# Patient Record
Sex: Female | Born: 1935
Health system: Southern US, Community
[De-identification: ages and names within clinical notes are randomized; demographics above are authoritative.]

## PROBLEM LIST (undated history)

## (undated) DIAGNOSIS — R519 Headache, unspecified: Secondary | ICD-10-CM

## (undated) DIAGNOSIS — I10 Essential (primary) hypertension: Secondary | ICD-10-CM

## (undated) DIAGNOSIS — R7303 Prediabetes: Secondary | ICD-10-CM

## (undated) DIAGNOSIS — R51 Headache: Secondary | ICD-10-CM

## (undated) DIAGNOSIS — Z8719 Personal history of other diseases of the digestive system: Secondary | ICD-10-CM

## (undated) DIAGNOSIS — M1990 Unspecified osteoarthritis, unspecified site: Secondary | ICD-10-CM

## (undated) DIAGNOSIS — E559 Vitamin D deficiency, unspecified: Secondary | ICD-10-CM

## (undated) DIAGNOSIS — R7309 Other abnormal glucose: Secondary | ICD-10-CM

## (undated) DIAGNOSIS — F329 Major depressive disorder, single episode, unspecified: Secondary | ICD-10-CM

## (undated) DIAGNOSIS — Z87440 Personal history of urinary (tract) infections: Secondary | ICD-10-CM

## (undated) DIAGNOSIS — Z85820 Personal history of malignant melanoma of skin: Secondary | ICD-10-CM

## (undated) DIAGNOSIS — C801 Malignant (primary) neoplasm, unspecified: Secondary | ICD-10-CM

## (undated) DIAGNOSIS — F32A Depression, unspecified: Secondary | ICD-10-CM

## (undated) DIAGNOSIS — C439 Malignant melanoma of skin, unspecified: Secondary | ICD-10-CM

## (undated) DIAGNOSIS — F419 Anxiety disorder, unspecified: Secondary | ICD-10-CM

## (undated) DIAGNOSIS — K219 Gastro-esophageal reflux disease without esophagitis: Secondary | ICD-10-CM

## (undated) DIAGNOSIS — Z9889 Other specified postprocedural states: Secondary | ICD-10-CM

## (undated) DIAGNOSIS — C50919 Malignant neoplasm of unspecified site of unspecified female breast: Secondary | ICD-10-CM

## (undated) DIAGNOSIS — G8929 Other chronic pain: Secondary | ICD-10-CM

## (undated) HISTORY — DX: Headache, unspecified: R51.9

## (undated) HISTORY — DX: Headache: R51

## (undated) HISTORY — DX: Major depressive disorder, single episode, unspecified: F32.9

## (undated) HISTORY — DX: Vitamin D deficiency, unspecified: E55.9

## (undated) HISTORY — DX: Malignant neoplasm of unspecified site of unspecified female breast: C50.919

## (undated) HISTORY — DX: Other abnormal glucose: R73.09

## (undated) HISTORY — DX: Depression, unspecified: F32.A

## (undated) HISTORY — DX: Other specified postprocedural states: Z98.890

## (undated) HISTORY — DX: Malignant melanoma of skin, unspecified: C43.9

## (undated) HISTORY — DX: Anxiety disorder, unspecified: F41.9

## (undated) HISTORY — DX: Unspecified osteoarthritis, unspecified site: M19.90

## (undated) HISTORY — DX: Personal history of other diseases of the digestive system: Z87.19

## (undated) HISTORY — DX: Other chronic pain: G89.29

## (undated) HISTORY — DX: Personal history of malignant melanoma of skin: Z85.820

## (undated) HISTORY — DX: Malignant (primary) neoplasm, unspecified: C80.1

## (undated) HISTORY — DX: Gastro-esophageal reflux disease without esophagitis: K21.9

## (undated) HISTORY — DX: Personal history of urinary (tract) infections: Z87.440

---

## 1993-12-28 HISTORY — PX: MELANOMA EXCISION: SHX5266

## 1994-12-28 DIAGNOSIS — C50919 Malignant neoplasm of unspecified site of unspecified female breast: Secondary | ICD-10-CM

## 1994-12-28 HISTORY — PX: MASTECTOMY: SHX3

## 1994-12-28 HISTORY — DX: Malignant neoplasm of unspecified site of unspecified female breast: C50.919

## 1995-12-29 HISTORY — PX: BREAST EXCISIONAL BIOPSY: SUR124

## 1999-12-29 HISTORY — PX: ABDOMINAL HYSTERECTOMY: SHX81

## 2000-08-04 ENCOUNTER — Encounter: Admission: RE | Admit: 2000-08-04 | Discharge: 2000-08-04 | Payer: Self-pay | Admitting: Hematology and Oncology

## 2000-08-04 ENCOUNTER — Encounter: Payer: Self-pay | Admitting: Hematology and Oncology

## 2000-08-24 ENCOUNTER — Encounter: Payer: Self-pay | Admitting: Hematology and Oncology

## 2000-08-24 ENCOUNTER — Encounter: Admission: RE | Admit: 2000-08-24 | Discharge: 2000-08-24 | Payer: Self-pay | Admitting: Hematology and Oncology

## 2001-08-25 ENCOUNTER — Encounter: Admission: RE | Admit: 2001-08-25 | Discharge: 2001-08-25 | Payer: Self-pay | Admitting: *Deleted

## 2001-08-25 ENCOUNTER — Encounter: Payer: Self-pay | Admitting: *Deleted

## 2002-08-25 ENCOUNTER — Encounter: Admission: RE | Admit: 2002-08-25 | Discharge: 2002-08-25 | Payer: Self-pay | Admitting: *Deleted

## 2002-08-25 ENCOUNTER — Encounter: Payer: Self-pay | Admitting: *Deleted

## 2003-08-27 ENCOUNTER — Encounter: Admission: RE | Admit: 2003-08-27 | Discharge: 2003-08-27 | Payer: Self-pay | Admitting: Surgery

## 2003-08-27 ENCOUNTER — Encounter: Payer: Self-pay | Admitting: Surgery

## 2004-08-27 ENCOUNTER — Encounter: Admission: RE | Admit: 2004-08-27 | Discharge: 2004-08-27 | Payer: Self-pay | Admitting: Surgery

## 2004-10-13 ENCOUNTER — Ambulatory Visit: Payer: Self-pay | Admitting: Urology

## 2005-08-28 ENCOUNTER — Encounter: Admission: RE | Admit: 2005-08-28 | Discharge: 2005-08-28 | Payer: Self-pay | Admitting: Surgery

## 2005-11-17 ENCOUNTER — Ambulatory Visit: Payer: Self-pay | Admitting: Dermatology

## 2006-09-01 ENCOUNTER — Encounter: Admission: RE | Admit: 2006-09-01 | Discharge: 2006-09-01 | Payer: Self-pay | Admitting: Surgery

## 2007-05-11 DIAGNOSIS — D239 Other benign neoplasm of skin, unspecified: Secondary | ICD-10-CM

## 2007-05-11 HISTORY — DX: Other benign neoplasm of skin, unspecified: D23.9

## 2007-05-24 DIAGNOSIS — G44209 Tension-type headache, unspecified, not intractable: Secondary | ICD-10-CM | POA: Insufficient documentation

## 2007-09-05 ENCOUNTER — Encounter: Admission: RE | Admit: 2007-09-05 | Discharge: 2007-09-05 | Payer: Self-pay | Admitting: Family Medicine

## 2008-05-16 ENCOUNTER — Ambulatory Visit: Payer: Self-pay | Admitting: Dermatology

## 2008-05-28 ENCOUNTER — Ambulatory Visit: Payer: Self-pay | Admitting: Family Medicine

## 2008-06-01 ENCOUNTER — Ambulatory Visit: Payer: Self-pay | Admitting: Internal Medicine

## 2008-06-20 ENCOUNTER — Ambulatory Visit: Payer: Self-pay | Admitting: Internal Medicine

## 2008-06-22 ENCOUNTER — Ambulatory Visit: Payer: Self-pay | Admitting: Family Medicine

## 2008-09-05 ENCOUNTER — Encounter: Admission: RE | Admit: 2008-09-05 | Discharge: 2008-09-05 | Payer: Self-pay | Admitting: Surgery

## 2008-09-11 ENCOUNTER — Encounter: Admission: RE | Admit: 2008-09-11 | Discharge: 2008-09-11 | Payer: Self-pay | Admitting: Surgery

## 2008-11-13 ENCOUNTER — Ambulatory Visit: Payer: Self-pay | Admitting: Family Medicine

## 2008-12-28 HISTORY — PX: BREAST BIOPSY: SHX20

## 2009-01-10 ENCOUNTER — Ambulatory Visit: Payer: Self-pay | Admitting: Gastroenterology

## 2009-03-12 ENCOUNTER — Encounter (INDEPENDENT_AMBULATORY_CARE_PROVIDER_SITE_OTHER): Payer: Self-pay | Admitting: Surgery

## 2009-03-12 ENCOUNTER — Encounter: Admission: RE | Admit: 2009-03-12 | Discharge: 2009-03-12 | Payer: Self-pay | Admitting: Surgery

## 2009-09-09 ENCOUNTER — Encounter: Admission: RE | Admit: 2009-09-09 | Discharge: 2009-09-09 | Payer: Self-pay | Admitting: Surgery

## 2010-03-13 ENCOUNTER — Encounter: Admission: RE | Admit: 2010-03-13 | Discharge: 2010-03-13 | Payer: Self-pay | Admitting: Family Medicine

## 2010-09-17 ENCOUNTER — Ambulatory Visit: Payer: Self-pay | Admitting: Dermatology

## 2011-02-05 ENCOUNTER — Other Ambulatory Visit: Payer: Self-pay | Admitting: Surgery

## 2011-02-05 DIAGNOSIS — Z9012 Acquired absence of left breast and nipple: Secondary | ICD-10-CM

## 2011-03-16 ENCOUNTER — Ambulatory Visit
Admission: RE | Admit: 2011-03-16 | Discharge: 2011-03-16 | Disposition: A | Discharge status: Home / Self Care | Payer: Medicare Other | Source: Ambulatory Visit | Attending: Surgery | Admitting: Surgery

## 2011-03-16 DIAGNOSIS — Z9012 Acquired absence of left breast and nipple: Secondary | ICD-10-CM

## 2011-05-14 ENCOUNTER — Encounter (INDEPENDENT_AMBULATORY_CARE_PROVIDER_SITE_OTHER): Payer: Self-pay | Admitting: Surgery

## 2012-01-13 ENCOUNTER — Other Ambulatory Visit (INDEPENDENT_AMBULATORY_CARE_PROVIDER_SITE_OTHER): Payer: Self-pay | Admitting: Surgery

## 2012-01-13 DIAGNOSIS — Z1231 Encounter for screening mammogram for malignant neoplasm of breast: Secondary | ICD-10-CM

## 2012-01-22 ENCOUNTER — Ambulatory Visit (INDEPENDENT_AMBULATORY_CARE_PROVIDER_SITE_OTHER): Payer: Self-pay | Admitting: Surgery

## 2012-03-16 ENCOUNTER — Ambulatory Visit
Admission: RE | Admit: 2012-03-16 | Discharge: 2012-03-16 | Disposition: A | Discharge status: Home / Self Care | Payer: Medicare Other | Source: Ambulatory Visit | Attending: Surgery | Admitting: Surgery

## 2012-03-16 DIAGNOSIS — Z1231 Encounter for screening mammogram for malignant neoplasm of breast: Secondary | ICD-10-CM

## 2012-03-18 ENCOUNTER — Encounter (INDEPENDENT_AMBULATORY_CARE_PROVIDER_SITE_OTHER): Payer: Self-pay | Admitting: Surgery

## 2012-03-18 ENCOUNTER — Ambulatory Visit (INDEPENDENT_AMBULATORY_CARE_PROVIDER_SITE_OTHER): Payer: Medicare Other | Admitting: Surgery

## 2012-03-18 VITALS — BP 140/80 | HR 72 | Temp 97.9°F | Resp 18 | Ht 64.0 in | Wt 171.6 lb

## 2012-03-18 DIAGNOSIS — Z853 Personal history of malignant neoplasm of breast: Secondary | ICD-10-CM

## 2012-03-18 NOTE — Progress Notes (Signed)
[facility]  Ovidio Kin, MD,  FACS [address] [place]  [address] [place], [place]    [address] Phone:  [phone] FAX:  [phone]   Re:   Christina Salas DOB:   [birth year] MRN:   [id number]  ASSESSMENT AND PLAN: 1.  Left breast cancer - 6 cm area of DCIS with a focus of invasive carcinoma.  T1, N0  (0/13 nodes)  Left MRM - Dec 1996 - Dr. Elvina Mattes.  Has been disease free.  We talked about her PCP following her, but he has been out of the office for several months.  So she will continue to see me for now.  I see her back in one year.  2.  HIstory of melanoma removed from head - 1995.  HISTORY OF PRESENT ILLNESS: Chief Complaint  Patient presents with  . Breast Cancer Long Term Follow Up    Christina Salas is a 78 y.o. (DOB: [birth year])  white female who is a patient of MORRISEY,LEMONT, MD, MD (Dr. Thana Ates has been out of office for about 5 months, this prompted her return to me) and comes to me today for follow up left breast cancer.  She is doing well. No new complaint, mass, or area of concern.  She is doing well.  PHYSICAL EXAM: BP 140/80  Pulse 72  Temp(Src) 97.9 F (36.6 C) (Temporal)  Resp 18  Ht 5\' 4"  (1.626 m)  Wt 171 lb 9.6 oz (77.837 kg)  BMI 29.45 kg/m2  HEENT:  Pupils equal.  Dentition good.  No injury. NECK:  Supple.  No thyroid mass. LYMPH NODES:  No cervical, supraclavicular, or axillary adenopathy. BREASTS -  RIGHT:  No palpable mass or nodule.  No nipple discharge.   LEFT:  No palpable mass or nodule.  Left breast absent. UPPER EXTREMITIES:  No evidence of lymphedema.  DATA REVIEWED: Mammogram - 03/16/2012 - negative.  Ovidio Kin, MD, FACS Office:  [phone]

## 2013-02-08 DIAGNOSIS — N390 Urinary tract infection, site not specified: Secondary | ICD-10-CM | POA: Insufficient documentation

## 2013-02-08 DIAGNOSIS — N3946 Mixed incontinence: Secondary | ICD-10-CM | POA: Insufficient documentation

## 2013-02-23 ENCOUNTER — Observation Stay: Payer: Self-pay | Admitting: Internal Medicine

## 2013-02-23 LAB — COMPREHENSIVE METABOLIC PANEL
Alkaline Phosphatase: 108 U/L (ref 50–136)
BUN: 21 mg/dL — ABNORMAL HIGH (ref 7–18)
Bilirubin,Total: 0.4 mg/dL (ref 0.2–1.0)
Calcium, Total: 9.1 mg/dL (ref 8.5–10.1)
Chloride: 106 mmol/L (ref 98–107)
Co2: 25 mmol/L (ref 21–32)
Potassium: 3.9 mmol/L (ref 3.5–5.1)
SGOT(AST): 34 U/L (ref 15–37)
SGPT (ALT): 26 U/L (ref 12–78)

## 2013-02-23 LAB — CBC WITH DIFFERENTIAL/PLATELET
Basophil #: 0.1 10*3/uL (ref 0.0–0.1)
HGB: 13.8 g/dL (ref 12.0–16.0)
Lymphocyte #: 2 10*3/uL (ref 1.0–3.6)
MCV: 87 fL (ref 80–100)
Neutrophil #: 8 10*3/uL — ABNORMAL HIGH (ref 1.4–6.5)
Platelet: 211 10*3/uL (ref 150–440)
RDW: 14.3 % (ref 11.5–14.5)

## 2013-02-23 LAB — URINALYSIS, COMPLETE
Bilirubin,UR: NEGATIVE
Glucose,UR: NEGATIVE mg/dL (ref 0–75)
Ketone: NEGATIVE
Leukocyte Esterase: NEGATIVE
Specific Gravity: 1.012 (ref 1.003–1.030)

## 2013-02-23 LAB — CK TOTAL AND CKMB (NOT AT ARMC): CK, Total: 225 U/L — ABNORMAL HIGH (ref 21–215)

## 2013-02-23 LAB — PROTIME-INR: Prothrombin Time: 14 secs (ref 11.5–14.7)

## 2013-02-24 LAB — CBC WITH DIFFERENTIAL/PLATELET
Basophil #: 0 10*3/uL (ref 0.0–0.1)
Basophil %: 0.1 %
Eosinophil #: 0.2 10*3/uL (ref 0.0–0.7)
HCT: 34.8 % — ABNORMAL LOW (ref 35.0–47.0)
HGB: 11.6 g/dL — ABNORMAL LOW (ref 12.0–16.0)
Lymphocyte %: 27.7 %
MCH: 28.8 pg (ref 26.0–34.0)
MCV: 87 fL (ref 80–100)
Monocyte %: 7.1 %
Neutrophil %: 62.9 %
Platelet: 155 10*3/uL (ref 150–440)
RDW: 14.3 % (ref 11.5–14.5)
WBC: 8.2 10*3/uL (ref 3.6–11.0)

## 2013-02-24 LAB — BASIC METABOLIC PANEL
Anion Gap: 6 — ABNORMAL LOW (ref 7–16)
BUN: 11 mg/dL (ref 7–18)
Calcium, Total: 8.3 mg/dL — ABNORMAL LOW (ref 8.5–10.1)
Chloride: 110 mmol/L — ABNORMAL HIGH (ref 98–107)
Creatinine: 0.73 mg/dL (ref 0.60–1.30)
EGFR (Non-African Amer.): >60
Glucose: 90 mg/dL (ref 65–99)
Osmolality: 284 (ref 275–301)
Potassium: 3.8 mmol/L (ref 3.5–5.1)
Sodium: 143 mmol/L (ref 136–145)

## 2013-02-28 ENCOUNTER — Other Ambulatory Visit: Payer: Self-pay

## 2013-02-28 DIAGNOSIS — Z9012 Acquired absence of left breast and nipple: Secondary | ICD-10-CM

## 2013-03-01 ENCOUNTER — Telehealth (INDEPENDENT_AMBULATORY_CARE_PROVIDER_SITE_OTHER): Payer: Self-pay

## 2013-03-01 NOTE — Telephone Encounter (Signed)
Informed family member of appt 04/26/13 @ 10 am with Dr. Ezzard Standing. Advised her to have patient to call to confirm appt date and time

## 2013-03-20 ENCOUNTER — Ambulatory Visit: Payer: Self-pay | Admitting: Gastroenterology

## 2013-03-27 ENCOUNTER — Ambulatory Visit
Admission: RE | Admit: 2013-03-27 | Discharge: 2013-03-27 | Disposition: A | Discharge status: Home / Self Care | Payer: Self-pay | Source: Ambulatory Visit

## 2013-03-27 DIAGNOSIS — Z9012 Acquired absence of left breast and nipple: Secondary | ICD-10-CM

## 2013-03-27 DIAGNOSIS — Z1231 Encounter for screening mammogram for malignant neoplasm of breast: Secondary | ICD-10-CM

## 2013-04-16 LAB — HM COLONOSCOPY

## 2013-04-26 ENCOUNTER — Ambulatory Visit (INDEPENDENT_AMBULATORY_CARE_PROVIDER_SITE_OTHER): Payer: Medicare Other | Admitting: Surgery

## 2013-06-21 ENCOUNTER — Ambulatory Visit (INDEPENDENT_AMBULATORY_CARE_PROVIDER_SITE_OTHER): Payer: Medicare Other | Admitting: Surgery

## 2013-06-21 ENCOUNTER — Encounter (INDEPENDENT_AMBULATORY_CARE_PROVIDER_SITE_OTHER): Payer: Self-pay | Admitting: Surgery

## 2013-06-21 VITALS — BP 114/62 | HR 68 | Temp 99.2°F | Resp 15 | Ht 64.0 in | Wt 174.2 lb

## 2013-06-21 DIAGNOSIS — Z853 Personal history of malignant neoplasm of breast: Secondary | ICD-10-CM

## 2013-06-21 NOTE — Progress Notes (Signed)
[facility]  Ovidio Kin, MD,  FACS [address] [place]  [address] [place], [place]    [address] Phone:  [phone] FAX:  [phone]   Re:   Christina Salas DOB:   11/02/1933 MRN:   [id number]  ASSESSMENT AND PLAN: 1.  Left breast cancer - 6 cm area of DCIS with a focus of invasive carcinoma.  T1, N0  (0/13 nodes)  Left MRM - Dec 1996 - Dr. Elvina Mattes.  Has been disease free.   I saw her back this year because her PCP had been out of the office.  We again talked about her seeing me.  If she is doing well and Dr. Thana Ates follows her, then her follow up here can be PRN.  If she wants me to see her, I am more than happy to see her.  I see her back in one year.  2.  HIstory of melanoma removed from head - 1995.  HISTORY OF PRESENT ILLNESS: Chief Complaint  Patient presents with  . Breast Cancer Long Term Follow Up    yearly br ca checkup   Christina Salas is a 79 y.o. (DOB: 1933-09-02)  white female who is a patient of MORRISEY,LEMONT, MD ([place]) and comes to me today for follow up left breast cancer.  She is doing well. No new complaint, mass, or area of concern.  She reminded me that I took care of her mother the day she died at age 56.  We talked about Dr. Samuella Cota a little.  Social History: Unmarried.  Lives by self. Niece had lived with her until recently. I took care of her mother, age 50, the day she died.  PHYSICAL EXAM: BP 114/62  Pulse 68  Temp(Src) 99.2 F (37.3 C) (Temporal)  Resp 15  Ht 5\' 4"  (1.626 m)  Wt 174 lb 3.2 oz (79.017 kg)  BMI 29.89 kg/m2  HEENT:  Pupils equal.  Dentition good.  No injury. NECK:  Supple.  No thyroid mass. LYMPH NODES:  No cervical, supraclavicular, or axillary adenopathy. BREASTS -  RIGHT:  No palpable mass or nodule.  No nipple discharge.   LEFT:  No palpable mass or nodule.  Left breast absent. UPPER EXTREMITIES:  No evidence of lymphedema.  She said that she does have some mild swelling, but it  is not significant.  DATA REVIEWED: Mammogram ([facility])- 03/27/2013 - negative.  Ovidio Kin, MD, FACS Office:  [phone]

## 2013-10-17 ENCOUNTER — Ambulatory Visit: Payer: Self-pay | Admitting: Family Medicine

## 2014-02-25 LAB — HM MAMMOGRAPHY: HM Mammogram: NORMAL

## 2014-03-13 ENCOUNTER — Other Ambulatory Visit: Payer: Self-pay

## 2014-03-13 DIAGNOSIS — Z1231 Encounter for screening mammogram for malignant neoplasm of breast: Secondary | ICD-10-CM

## 2014-03-29 ENCOUNTER — Ambulatory Visit
Admission: RE | Admit: 2014-03-29 | Discharge: 2014-03-29 | Disposition: A | Discharge status: Home / Self Care | Payer: Medicare Other | Source: Ambulatory Visit

## 2014-03-29 DIAGNOSIS — Z1231 Encounter for screening mammogram for malignant neoplasm of breast: Secondary | ICD-10-CM

## 2014-04-02 HISTORY — PX: COLONOSCOPY: SHX174

## 2014-08-23 ENCOUNTER — Encounter (INDEPENDENT_AMBULATORY_CARE_PROVIDER_SITE_OTHER): Payer: Self-pay | Admitting: Surgery

## 2014-08-23 ENCOUNTER — Ambulatory Visit (INDEPENDENT_AMBULATORY_CARE_PROVIDER_SITE_OTHER): Payer: Medicare Other | Admitting: Surgery

## 2014-08-23 VITALS — BP 142/84 | HR 76 | Temp 98.0°F | Resp 18 | Ht 66.0 in | Wt 176.0 lb

## 2014-08-23 DIAGNOSIS — Z853 Personal history of malignant neoplasm of breast: Secondary | ICD-10-CM

## 2014-08-23 NOTE — Progress Notes (Signed)
[place], MD,  [address]  [place], [place]    Christina Salas Phone:  [phone] FAX:  [phone]   Re:   Christina Salas DOB:   28-Mar-1933 MRN:   [id number]  ASSESSMENT AND PLAN: 1.  Left breast cancer - 6 cm area of DCIS with a focus of invasive carcinoma.  T1, N0  (0/13 nodes)  Left MRM - Dec 1996 - Dr. Middendorf Kehr.  Has been disease free.   We have talked about her not seeing me, but she still wants to see me in one year.  I see her back in one year.  2.  HIstory of melanoma removed from head - 1995.  HISTORY OF PRESENT ILLNESS: Chief Complaint  Patient presents with  . Breast Cancer Long Term Follow Up   Christina Salas is a 80 y.o. (DOB: 06-18-1933)  white female who is a patient of MORRISEY,LEMONT, MD ([place]) and comes to me today for follow up left breast cancer. She comes by herself.  She is doing well. No new complaint, mass, or area of concern.  She reminded me that I took care of her mother the day she died at age 27.  We talked about Dr. March Rummage a little.  Social History: Unmarried.  Lives by self. I took care of her mother, age 57, the day she died.  PHYSICAL EXAM: BP 142/84  Pulse 76  Temp(Src) 98 F (36.7 C)  Resp 18  Ht 5\' 6"  (1.676 m)  Wt 176 lb (79.833 kg)  BMI 28.42 kg/m2  HEENT:  Pupils equal.  Dentition good.  No injury. NECK:  Supple.  No thyroid mass. LYMPH NODES:  No cervical, supraclavicular, or axillary adenopathy. BREASTS -  RIGHT:  No palpable mass or nodule.  No nipple discharge.   LEFT:  No palpable mass or nodule.  Left breast absent. UPPER EXTREMITIES:  No evidence of lymphedema.  She said that she does have some mild swelling of the left arm, but it is not significant.  DATA REVIEWED: Mammogram ([facility])- 03/29/2014 - negative.  Alphonsa Overall, MD, [facility] Office:  [phone]

## 2014-11-27 HISTORY — PX: CYSTOSCOPY: SUR368

## 2014-12-11 LAB — HEMOGLOBIN A1C: Hgb A1c MFr Bld: 6.2 % — AB (ref 4.0–6.0)

## 2015-01-01 LAB — BASIC METABOLIC PANEL
BUN: 22 mg/dL — AB (ref 4–21)
Creatinine: 0.8 mg/dL (ref 0.5–1.1)
Glucose: 113 mg/dL
POTASSIUM: 4.3 mmol/L (ref 3.4–5.3)
SODIUM: 143 mmol/L (ref 137–147)

## 2015-01-01 LAB — CBC AND DIFFERENTIAL
HCT: 37 % (ref 36–46)
HEMOGLOBIN: 12.9 g/dL (ref 12.0–16.0)
Neutrophils Absolute: 2 /uL
Platelets: 220 10*3/uL (ref 150–399)
WBC: 5.2 10^3/mL

## 2015-01-01 LAB — LIPID PANEL
Cholesterol: 183 mg/dL (ref 0–200)
HDL: 47 mg/dL (ref 35–70)
LDL Cholesterol: 95 mg/dL
LDl/HDL Ratio: 2
TRIGLYCERIDES: 207 mg/dL — AB (ref 40–160)

## 2015-01-01 LAB — HEPATIC FUNCTION PANEL
ALK PHOS: 80 U/L (ref 25–125)
ALT: 15 U/L (ref 7–35)
AST: 19 U/L (ref 13–35)
BILIRUBIN, TOTAL: 0.3 mg/dL

## 2015-01-01 LAB — TSH: TSH: 2.72 u[IU]/mL (ref 0.41–5.90)

## 2015-03-25 ENCOUNTER — Other Ambulatory Visit: Payer: Self-pay

## 2015-03-25 DIAGNOSIS — Z9012 Acquired absence of left breast and nipple: Secondary | ICD-10-CM

## 2015-03-25 DIAGNOSIS — Z853 Personal history of malignant neoplasm of breast: Secondary | ICD-10-CM

## 2015-03-25 DIAGNOSIS — Z1231 Encounter for screening mammogram for malignant neoplasm of breast: Secondary | ICD-10-CM

## 2015-04-12 ENCOUNTER — Ambulatory Visit
Admission: RE | Admit: 2015-04-12 | Discharge: 2015-04-12 | Disposition: A | Discharge status: Home / Self Care | Payer: Medicare Other | Source: Ambulatory Visit

## 2015-04-12 DIAGNOSIS — Z9012 Acquired absence of left breast and nipple: Secondary | ICD-10-CM

## 2015-04-12 DIAGNOSIS — Z1231 Encounter for screening mammogram for malignant neoplasm of breast: Secondary | ICD-10-CM

## 2015-04-12 DIAGNOSIS — Z853 Personal history of malignant neoplasm of breast: Secondary | ICD-10-CM

## 2015-04-19 NOTE — Consult Note (Signed)
PATIENT NAME:  Christina Salas, Christina Salas MR#:  [id number] DATE OF BIRTH:  1933/08/16  DATE OF CONSULTATION:  02/23/2013  CONSULTING PHYSICIAN:  Lollie Sails, MD  REASON FOR CONSULTATION: Rectal bleeding.   HISTORY OF PRESENT ILLNESS: The patient is a 81 year old Caucasian female who presented today to the Emergency Room after several episodes of some bloody diarrhea. She states that she went to the bathroom yesterday evening with an episode of diarrhea. She took some Imodium and then noted she had another episode, this being bright red. She does have a history of hemorrhoids. Her last episode of passing a bloody effluent was about 12:00 this afternoon. The patient is seen at around 2 to 2:30. She denies any abdominal pain with these episodes. There is no rectal pain. There is no nausea or vomiting. She states that she did get some dry heaves yesterday and some "bathroom cramps" that were  not uncommon. Her bowel habits are irregular, perhaps a bowel movement every 1 to 3 days. She does take Nexium on a regular basis. She does have a history of dysphagia necessitating dilatation. Her last EGD and colonoscopy were by Dr. Allen Norris in 2010. Her colonoscopy on 01/10/2009 showed diverticulosis as well as some internal hemorrhoids. She has been taking Excedrin on a daily basis, at least 2 tablets daily. We discussed the side effects and problems related to this today. She has been hemodynamically stable. Her hemoglobin has remained normal.   GASTROINTESTINAL FAMILY HISTORY: Pertinent for brother having cirrhosis of the liver. This was felt to be not alcohol related. There is no family history of colorectal cancer or ulcer disease. The patient has never had ulcers herself.   PAST MEDICAL HISTORY:  1.  She has a history of varicose veins and fluid retention in her legs. 2.  Multiple urinary tract infections.  3 .  History of breast cancer, status post mastectomy, remote. 4.  History of gastroesophageal reflux. 5.   Degenerative arthritis.   PAST SURGICAL HISTORY: Includes hysterectomy, melanoma resection from the scalp, left radical mastectomy.  ALLERGIES: She has no known drug allergies.   SOCIAL HISTORY: The patient does not smoke or use alcohol. She takes care of a niece who has problems with multiple sclerosis.   OUTPATIENT MEDICATIONS: Include Fioricet 325/50/40 mg 2 tablets every 4 hours as needed. She also takes over-the-counter Excedrin as stated. She also takes Nexium 40 mg once a day.   REVIEW OF SYSTEMS: Multiple systems reviewed per admission history and physical, agree with same.   PHYSICAL EXAMINATION:  VITAL SIGNS: Temperature is 97.8, pulse 79, respirations 18, blood pressure 153/77, pulse oximetry 97%.  GENERAL: She is a 81 year old Caucasian female in no acute distress.  HEENT: Normocephalic, atraumatic. Eyes: Anicteric. Nose: Septum midline, no lesions. Oropharynx: No lesions.  NECK: Supple. No JVD. No lymphadenopathy. No thyromegaly.  HEART: Regular rate and rhythm without rub or gallop.  LUNGS: Bilaterally clear.  ABDOMEN: Soft, nontender, nondistended. Bowel sounds positive, normoactive.  RECTAL: Anorectal examination shows minimal trace of a brightish red tiny clot. There seems to be no active bleeding currently. There are some internal hemorrhoids noted. There is no stool in the vault.  EXTREMITIES: No clubbing, cyanosis or edema.  NEUROLOGICAL: Cranial nerves II through XII grossly intact. Muscle strength bilaterally equal and symmetric, 5/5. DTRs bilaterally equal and symmetric.   LABORATORY AND RADIOLOGICAL DATA: On admission to the hospital, she had a glucose of 126, BUN 21, creatinine 0.73, sodium 139, potassium 3.9, chloride 106, bicarbonate 25, calcium  9.1. There was slight hemolysis noted. She had a normal hepatic profile. She had 1 set of cardiac CPK-MB which was normal. She had a hemogram showing white count of 10.8, hemoglobin and hematocrit 13.8 and 41.8, platelet  count 211, MCV 87. She has been typed and screened. Her coags include a pro time of 14.0, INR of 1.1, activated PTT of 34.3. Urinalysis is normal. There have been no imaging studies.   ASSESSMENT: Hematochezia in the setting of intermittent problems with constipation and known history of internal hemorrhoids. On rectal examination, there is minimal trace of rectal bleeding, this consistent with anal outlet/hemorrhoidal bleeding.   RECOMMENDATION:  1.  Recheck hemoglobin in the morning.  2.  Will start treatment with Anusol-HC 2.5% cream t.i.d. for 10 days.  3.  Will recommend placing her on MiraLax once a day and Citrucel 1 dose daily.    ____________________________ Lollie Sails, MD mus:jm D: 02/23/2013 21:30:31 ET T: 02/23/2013 22:00:49 ET JOB#: [id number]  cc: Lollie Sails, MD, <Dictator> Lollie Sails MD ELECTRONICALLY SIGNED 02/26/2013 10:40

## 2015-04-19 NOTE — H&P (Signed)
PATIENT NAME:  Christina Salas, YADAO MR#:  [id number] DATE OF BIRTH:  19-Oct-1933  DATE OF ADMISSION:  02/23/2013  ADMITTING PHYSICIAN:  Gladstone Lighter, MD  PRIMARY CARE PHYSICIAN: Ashok Norris, MD   CHIEF COMPLAINT: Rectal bleed.   HISTORY OF PRESENT ILLNESS: The patient is a pleasant 81 year old, relatively healthy Caucasian female with past medical history significant for dysphagia secondary Schatzki's ring in esophagus status post dilatation, breast cancer currently in remission and arthritis, presents from home secondary to several episodes of bright red blood per rectum. The patient's last colonoscopy was in 2010 and by Dr. Lucilla Lame which showed diverticulosis in the sigmoid colon. She did have internal moderate hemorrhoids at the time,  which was not bleeding. Since then the patient states she probably had 1 or 2 episodes of blood streaks following stool,  but never had bleeding like this before. Since yesterday morning she, has feeling nauseous, no vomiting, had dry heaves, cramping pain in the lower abdomen.  She did not feel like eating her supper, but just had a little bit of chicken soup last night. She does not remember being exposed to anybody sick with gastroenteritis or did not eat anything that she is allergic to. She did have some chills. Since last night, she used the bathroom about 20 times and says each time, she had decent amount of bright red blood and sometimes blood clots that she passed. She was concerned and presents to the ER. Her hemoglobin here is stable. She has not had further bleeding here, but her guaiac was positive, so she is being admitted for observation.   PAST MEDICAL HISTORY: 1.  Varicose veins and fluid retention in legs.   2.  History of multiple UTIs. 3.  Degenerative arthritis.  4.  History of breast cancer status post mastectomy, currently in remission.  5.  Gastroesophageal reflux disease.    PAST SURGICAL HISTORY: 1.  Left radical mastectomy.   2.  Hysterectomy.  3.  Melanoma resection, from scalp.     ALLERGIES: No known drug allergies.   HOME MEDICATIONS: 1.  Nexium 40 mg p.o. daily.  2.  Fioricet as needed for migraines.   SOCIAL HISTORY: The patient lives at home and cares for her niece who has multiple sclerosis. No history of any smoking or alcohol use.    FAMILY HISTORY: Father died from coronary thrombosis,  mother was breast cancer survivor as well.   REVIEW OF SYSTEMS:   CONSTITUTIONAL: No fever, fatigue or weakness.  EYES: Uses reading glasses, has some poor vision. No glaucoma, cataracts or inflammation.  ENT: No tinnitus, ear pain, hearing loss, epistaxis or discharge.  RESPIRATORY: No cough, wheeze, hemoptysis or COPD.  CARDIOVASCULAR: No chest pain, orthopnea, edema, arrhythmia, palpitations or syncope.  GASTROINTESTINAL: Positive for nausea, dry heaves. No vomiting. Positive for some abdominal cramping pain and rectal bleed. No constipation. GENITOURINARY:  No frequency, incontinence or renal calculus.  ENDOCRINE: No polyuria, nocturia, thyroid problems, heat or cold intolerance.  HEMATOLOGY: No anemia, easy bruising or bleeding.  SKIN: No acne, rash or lesions.  MUSCULOSKELETAL: Positive for arthritis, no gout.  NEUROLOGIC: No numbness, weakness, CVA, TIA or seizures.  PSYCHOLOGICAL: No anxiety, insomnia or depression.   PHYSICAL EXAMINATION: VITAL SIGNS: Temperature 97.8 degrees Fahrenheit, pulse 79, respirations 18, blood pressure 153/77, pulse 97% on room.  GENERAL: Well-built, well-nourished female lying in bed, not in any acute distress.  HEENT: Normocephalic, atraumatic. Pupils equal, round, reacting to light. Anicteric sclerae. Extraocular movements intact. Oropharynx clear without  erythema, mass or exudates.  NECK: Supple. No thyromegaly, JVD or carotid bruits. No lymphadenopathy.  LUNGS: Moving air bilaterally. No wheeze or crackles. No use of accessory muscles for breathing.   CARDIOVASCULAR:  S1, S2 regular rate and rhythm. No murmurs, rubs or gallops.  ABDOMEN: Soft, nontender, nondistended. No hepatosplenomegaly. Normal bowel sounds.  EXTREMITIES: Mild pedal edema. No clubbing or cyanosis. 2+ dorsalis pedis pulses palpable bilaterally.  SKIN: No acne, rash or lesions. RECTAL:  The patient does have skin tags. No external hemorrhoids noted. No active bleeding seen.  LYMPHATICS: No cervical lymphadenopathy.  NEUROLOGIC: Cranial nerves intact. No focal motor or sensory deficits.  PSYCHOLOGICAL: The patient is awake, alert, oriented x 3.   LABORATORY, DIAGNOSTIC AND RADIOLOGICAL DATA: Urinalysis negative for any infection. WBC 10.8, hemoglobin 13.8, hematocrit 41.8, platelet count 211. Sodium 139, potassium 3.9, chloride 106, bicarbonate 25, BUN 21, creatinine 0.73, glucose 126 and calcium 9.1.   ALT 26, AST 34, alkaline phosphatase 108, total bilirubin  0.4, albumin 4.0. INR 1.1, CK 225, CK-MB 3.5.   ASSESSMENT AND PLAN: A 81 year old female who is relatively healthy.  Past medical history of only Schatzki's ring status post dilatation, diverticulosis and gastroesophageal reflux disease, comes in for a bright red blood per rectum several times last night.  1.  Rectal bleeding could be diverticular bleed currently stopped at this time. We will continue to observe overnight, hemoglobin is stable now. We will check hemoglobin every 8 hours. Gastrointestinal consulted.  Last colonoscopy was 4 years ago, which showed diverticulosis at that time.  If she remains stable with no further bleeding, she might likely be discharged tomorrow.   2.  Gastroesophageal reflux disease. Continue Nexium.  3.  Migraine.  She is stable, currently on Fioricet.  4.  History of dysphagia status post esophageal dilatation x 3 in the past. Continue outpatient gastrointestinal follow-up, currently stable.   CODE STATUS: Full code.   TIME SPENT ON ADMISSION: 50 minutes.    ____________________________ Gladstone Lighter, MD rk:cc D: 02/23/2013 15:58:04 ET T: 02/23/2013 17:35:52 ET JOB#: [id number]  cc: Gladstone Lighter, MD, <Dictator> Ashok Norris, MD Gladstone Lighter MD ELECTRONICALLY SIGNED 02/24/2013 13:45

## 2015-04-19 NOTE — Discharge Summary (Signed)
PATIENT NAME:  Christina Salas, Christina Salas MR#:  [id number] DATE OF BIRTH:  May 29, 1933  DATE OF ADMISSION:  02/23/2013 DATE OF DISCHARGE:  02/24/2013  PRIMARY CARE PHYSICIAN:  Dr. Rutherford Nail.   CONSULTATIONS IN THE HOSPITAL:  GI consultation by Dr. Gustavo Lah.   DISCHARGE DIAGNOSES: 1.  Rectal bleed, likely diverticular versus internal hemorrhoids.  2.  Migraine.  3.  Gastroesophageal reflux disease.   DISCHARGE HOME MEDICATIONS:  1.  Nexium 40 mg by mouth daily.  2.  Fioricet 2 tablets every 4 hours as needed for headache.  3.  2.5% Anusol hydrocortisone cream 1 application rectally 3 times a day.  4.  Citrucel 1 scoop orally daily for constipation.  Hold for greater than two bowel movements per day.   DISCHARGE OXYGEN:  None.   DISCHARGE DIET:  Low-sodium diet.   DISCHARGE ACTIVITY:  As tolerated.   FOLLOWUP INSTRUCTIONS: 1.  GI follow-up in two weeks.  2.  PCP follow-up in two weeks.   LABORATORY AND IMAGING STUDIES PRIOR TO DISCHARGE:  WBC 8.2, hemoglobin 13.3, hematocrit 34.8, platelet count 155.  Sodium 143, potassium 3.8, chloride 110, bicarbonate 27, BUN 11, creatinine 0.73, glucose 90 and calcium of 8.3.  Urinalysis, negative for infection.  INR is 1.1.   BRIEF HOSPITAL COURSE:  The patient is a pleasant 81 year old Caucasian female with not many significant medical problems other than migraines, gastroesophageal reflux disease and history of urinary tract infections, presents to the hospital secondary to multiple episodes of rectal bleeding prior to admission.  1.  Rectal bleed.  The patient does have history of diverticulosis based on 2010 colonoscopy.  She also has nonbleeding internal hemorrhoids at that time.  It was painful bleeding, self-limiting so it could be either diverticular or hemorrhoidal bleed.  GI has seen the patient in the hospital.  She has not had further episodes here.  Her hemoglobin was stable so she was started on Citrucel to avoid constipation and also Anusol  rectal cream for hemorrhoid.  She is doing well and stable for discharge today.  All her other home medications are being continued.  Her course has been otherwise uneventful in the hospital.   DISCHARGE CONDITION:  Stable.   DISCHARGE DISPOSITION:  Home.   TIME SPENT ON DISCHARGE:  40 minutes.     ____________________________ Gladstone Lighter, MD rk:ea D: 02/24/2013 13:19:01 ET T: 02/25/2013 02:21:35 ET JOB#: [id number]  cc: Gladstone Lighter, MD, <Dictator> Ashok Norris, MD Gladstone Lighter MD ELECTRONICALLY SIGNED 03/01/2013 13:36

## 2015-04-19 NOTE — Consult Note (Signed)
Brief Consult Note: Diagnosis: rectal bleeding.   Patient was seen by consultant.   Consult note dictated.   Recommend further assessment or treatment.   Orders entered.   Comments: Please see full GI consult #[id number].  [place] admitted with several episodes of bright red hematochezia.  Anorectal exam c/w anal outlet bleeding/known h/o internal hemorrhoids.  Recheck hgb in am.  will start anusol hc 2.5% cream tid for 10 days.  One dose of citrucel daily and one dose of miralax daily. Following.  Will likley be able to go home tomorrow depending on clinical curcumstance.  Electronic Signatures: Loistine Simas (MD)  (Signed 27-Feb-14 21:33)  Authored: Brief Consult Note   Last Updated: 27-Feb-14 21:33 by Loistine Simas (MD)

## 2015-06-19 ENCOUNTER — Encounter: Payer: Self-pay | Admitting: Family Medicine

## 2015-06-19 ENCOUNTER — Ambulatory Visit (INDEPENDENT_AMBULATORY_CARE_PROVIDER_SITE_OTHER): Payer: Medicare Other | Admitting: Family Medicine

## 2015-06-19 VITALS — BP 124/78 | HR 89 | Temp 98.6°F | Resp 16 | Ht 63.0 in | Wt 175.9 lb

## 2015-06-19 DIAGNOSIS — G44209 Tension-type headache, unspecified, not intractable: Secondary | ICD-10-CM | POA: Diagnosis not present

## 2015-06-19 DIAGNOSIS — G43909 Migraine, unspecified, not intractable, without status migrainosus: Secondary | ICD-10-CM | POA: Insufficient documentation

## 2015-06-19 DIAGNOSIS — R609 Edema, unspecified: Secondary | ICD-10-CM

## 2015-06-19 DIAGNOSIS — C50919 Malignant neoplasm of unspecified site of unspecified female breast: Secondary | ICD-10-CM | POA: Insufficient documentation

## 2015-06-19 DIAGNOSIS — K2100 Gastro-esophageal reflux disease with esophagitis, without bleeding: Secondary | ICD-10-CM

## 2015-06-19 DIAGNOSIS — I8390 Asymptomatic varicose veins of unspecified lower extremity: Secondary | ICD-10-CM

## 2015-06-19 DIAGNOSIS — R600 Localized edema: Secondary | ICD-10-CM

## 2015-06-19 DIAGNOSIS — K219 Gastro-esophageal reflux disease without esophagitis: Secondary | ICD-10-CM | POA: Insufficient documentation

## 2015-06-19 DIAGNOSIS — E785 Hyperlipidemia, unspecified: Secondary | ICD-10-CM | POA: Insufficient documentation

## 2015-06-19 DIAGNOSIS — K5790 Diverticulosis of intestine, part unspecified, without perforation or abscess without bleeding: Secondary | ICD-10-CM | POA: Insufficient documentation

## 2015-06-19 DIAGNOSIS — Z9889 Other specified postprocedural states: Secondary | ICD-10-CM | POA: Insufficient documentation

## 2015-06-19 DIAGNOSIS — I868 Varicose veins of other specified sites: Secondary | ICD-10-CM

## 2015-06-19 DIAGNOSIS — I83893 Varicose veins of bilateral lower extremities with other complications: Secondary | ICD-10-CM | POA: Insufficient documentation

## 2015-06-19 DIAGNOSIS — IMO0001 Reserved for inherently not codable concepts without codable children: Secondary | ICD-10-CM | POA: Insufficient documentation

## 2015-06-19 NOTE — Patient Instructions (Signed)
F/U 4 MO 

## 2015-06-19 NOTE — Progress Notes (Signed)
Name: Christina Salas   MRN: [id number]    DOB: 12-09-1933   Date:06/19/2015       Progress Note  Subjective  Chief Complaint  Chief Complaint  Patient presents with  . Gastrophageal Reflux    Patient states that she is taking nexium and this provides her relief.  . Migraine    Patient states that headaches are under pretty good control.    Gastrophageal Reflux She complains of dysphagia, heartburn and nausea. She reports no chest pain, no coughing or no sore throat. This is a chronic problem. The current episode started more than 1 year ago. The problem occurs frequently. The problem has been unchanged. The symptoms are aggravated by caffeine, stress and lying down. Pertinent negatives include no weight loss. She has tried a PPI for the symptoms. The treatment provided moderate relief. Past procedures include an EGD.  Migraine  This is a chronic problem. The current episode started more than 1 year ago. The problem occurs intermittently. The problem has been unchanged. The pain is located in the right unilateral region. The pain does not radiate. The quality of the pain is described as aching. The pain is moderate. Associated symptoms include nausea. Pertinent negatives include no back pain, blurred vision, coughing, dizziness, eye redness, fever, hearing loss, insomnia, neck pain, seizures, sore throat, tingling, tinnitus, vomiting, weakness or weight loss. The symptoms are aggravated by fatigue. Treatments tried: Fioricet. The treatment provided moderate relief. Her past medical history is significant for cancer.   Varicose veins  Patient complains of tingling sensation in her extremities as well as swelling the venous varicosities.  Continuing the worsening. She has a pair of support stockings on oral therapy have not arrived dysuria. There is no history of any deep venous thrombosis      Past Medical History  Diagnosis Date  . GERD (gastroesophageal reflux disease)   . Cancer    . Arthritis   . Chronic headaches   . Osteoporosis     pre-osetoporosis per patient history form 03/18/12  . Breast cancer   . H/O melanoma excision   . Abnormal glucose   . Vitamin D deficiency   . History of recurrent UTIs   . History of diverticulitis     History  Substance Use Topics  . Smoking status: Never Smoker   . Smokeless tobacco: Not on file  . Alcohol Use: No     Current outpatient prescriptions:  .  butalbital-acetaminophen-caffeine (FIORICET, ESGIC) 50-325-40 MG per tablet, , Disp: , Rfl:  .  Ergocalciferol (VITAMIN D2) 2000 UNITS TABS, Take by mouth., Disp: , Rfl:  .  esomeprazole (NEXIUM) 40 MG capsule, Take 40 mg by mouth daily before breakfast.  , Disp: , Rfl:  .  Multiple Vitamins-Calcium (ESSENTIAL ONE DAILY MULTIVIT) TABS, Take by mouth., Disp: , Rfl:  .  mupirocin cream (BACTROBAN) 2 %, BACTROBAN, 2% (External Cream)  1 (one) Cream Cream two times daily for 10 days  Quantity: 30;  Refills: 2   Ordered :03-Dec-2014  Hollie Salk ;  Started 11-Dec-2014 Active, Disp: , Rfl:  .  nitrofurantoin (MACRODANTIN) 100 MG capsule, Take 100 mg by mouth as needed.  , Disp: , Rfl:  .  omega-3 acid ethyl esters (LOVAZA) 1 G capsule, Take 2 g by mouth 2 (two) times daily., Disp: , Rfl:  .  Cinnamon 500 MG capsule, Take 1 capsule by mouth daily., Disp: , Rfl:  .  Cranberry ([employer] CRANBERRY) 500 MG CAPS, Take 1 tablet by  mouth daily., Disp: , Rfl:   No Known Allergies  Review of Systems  Constitutional: Negative for fever, chills and weight loss.  HENT: Negative for congestion, hearing loss, sore throat and tinnitus.   Eyes: Negative for blurred vision, double vision and redness.  Respiratory: Negative for cough, hemoptysis and shortness of breath.   Cardiovascular: Positive for leg swelling (venous varicosities). Negative for chest pain, palpitations, orthopnea and claudication.  Gastrointestinal: Positive for heartburn, dysphagia and nausea. Negative for vomiting,  diarrhea, constipation and blood in stool.  Genitourinary: Negative for dysuria, urgency, frequency and hematuria.  Musculoskeletal: Negative for myalgias, back pain, joint pain, falls and neck pain.  Skin: Negative for itching.  Neurological: Positive for headaches. Negative for dizziness, tingling, tremors, focal weakness, seizures, loss of consciousness and weakness.  Endo/Heme/Allergies: Does not bruise/bleed easily.  Psychiatric/Behavioral: Negative for depression and substance abuse. The patient is not nervous/anxious and does not have insomnia.      Objective  Filed Vitals:   06/19/15 0815  BP: 124/78  Pulse: 89  Temp: 98.6 F (37 C)  Resp: 16  Height: 5\' 3"  (1.6 m)  Weight: 175 lb 14.4 oz (79.788 kg)  SpO2: 94%     Physical Exam  Constitutional: She is oriented to person, place, and time and well-developed, well-nourished, and in no distress.  HENT:  Head: Normocephalic.  Eyes: EOM are normal. Pupils are equal, round, and reactive to light.  Neck: Normal range of motion. No thyromegaly present.  Cardiovascular: Normal rate, regular rhythm and normal heart sounds.   No murmur heard. Pulmonary/Chest: Effort normal and breath sounds normal.  Abdominal: Soft. Bowel sounds are normal.  Musculoskeletal: Normal range of motion. She exhibits edema.  Venous varicosities greater on the left than right with trace edema. There is no thrombosis.  Neurological: She is alert and oriented to person, place, and time. No cranial nerve deficit. Gait normal.  Skin: Skin is warm and dry. No rash noted.  Psychiatric: Memory and affect normal.      Assessment & Plan  1. Tension headache Stable on current regimen   2. Gastroesophageal reflux disease with esophagitis Stable  3. Edema extremities Secondary to venous varicosities  4. Varicose veins Worsening. She is to obtain support hose. She is to keep them elevated as much as possible.

## 2015-10-21 ENCOUNTER — Ambulatory Visit (INDEPENDENT_AMBULATORY_CARE_PROVIDER_SITE_OTHER): Payer: Medicare Other | Admitting: Family Medicine

## 2015-10-21 ENCOUNTER — Encounter: Payer: Self-pay | Admitting: Family Medicine

## 2015-10-21 VITALS — BP 132/78 | HR 91 | Temp 98.6°F | Resp 16 | Ht 63.0 in | Wt 179.2 lb

## 2015-10-21 DIAGNOSIS — E785 Hyperlipidemia, unspecified: Secondary | ICD-10-CM

## 2015-10-21 DIAGNOSIS — Z23 Encounter for immunization: Secondary | ICD-10-CM | POA: Diagnosis not present

## 2015-10-21 DIAGNOSIS — G44209 Tension-type headache, unspecified, not intractable: Secondary | ICD-10-CM | POA: Diagnosis not present

## 2015-10-21 DIAGNOSIS — R739 Hyperglycemia, unspecified: Secondary | ICD-10-CM | POA: Insufficient documentation

## 2015-10-21 LAB — POCT GLYCOSYLATED HEMOGLOBIN (HGB A1C): Hemoglobin A1C: 6.1

## 2015-10-21 LAB — GLUCOSE, POCT (MANUAL RESULT ENTRY): POC Glucose: 113 mg/dl — AB (ref 70–99)

## 2015-10-21 NOTE — Progress Notes (Signed)
Name: Christina Salas   MRN: [id number]    DOB: 1933-10-06   Date:10/21/2015       Progress Note  Subjective  Chief Complaint  Chief Complaint  Patient presents with  . Hyperlipidemia    HPI  Hyperlipidemia  Patient has a history of hyperlipidemia for 5 years.  Current medical regimen consist of CINNAMON oTC .  Compliance is GOOD .  Diet and exercise are currently followed .  Risk factors for cardiovascular disease include hyperlipidemiaAND ADVANCED AGE. [name]   There have been no side effects from the medication.    hEADACHES  pATIENT STILL HAS HEADACHES. tABLET WHEN NECESSARY BASIS. nO CHANGE IN CHARACTER OR FREQUENCY  Past Medical History  Diagnosis Date  . GERD (gastroesophageal reflux disease)   . Cancer ([place])   . Arthritis   . Chronic headaches   . Osteoporosis     pre-osetoporosis per patient history form 03/18/12  . Breast cancer ([place])   . H/O melanoma excision   . Abnormal glucose   . Vitamin D deficiency   . History of recurrent UTIs   . History of diverticulitis     Social History  Substance Use Topics  . Smoking status: Never Smoker   . Smokeless tobacco: Not on file  . Alcohol Use: No     Current outpatient prescriptions:  .  butalbital-acetaminophen-caffeine (FIORICET, ESGIC) 50-325-40 MG per tablet, , Disp: , Rfl:  .  Cinnamon 500 MG capsule, Take 1 capsule by mouth daily., Disp: , Rfl:  .  Cranberry ([employer] CRANBERRY) 500 MG CAPS, Take 1 tablet by mouth daily., Disp: , Rfl:  .  Ergocalciferol (VITAMIN D2) 2000 UNITS TABS, Take by mouth., Disp: , Rfl:  .  esomeprazole (NEXIUM) 40 MG capsule, Take 40 mg by mouth daily before breakfast.  , Disp: , Rfl:  .  Multiple Vitamins-Calcium (ESSENTIAL ONE DAILY MULTIVIT) TABS, Take by mouth., Disp: , Rfl:  .  mupirocin cream (BACTROBAN) 2 %, BACTROBAN, 2% (External Cream)  1 (one) Cream Cream two times daily for 10 days  Quantity: 30;  Refills: 2   Ordered :03-Dec-2014  Hollie Salk ;  Started 11-Dec-2014 Active,  Disp: , Rfl:  .  nitrofurantoin (MACRODANTIN) 100 MG capsule, Take 100 mg by mouth as needed.  , Disp: , Rfl:  .  omega-3 acid ethyl esters (LOVAZA) 1 G capsule, Take 2 g by mouth 2 (two) times daily., Disp: , Rfl:   No Known Allergies  Review of Systems  Constitutional: Negative for fever, chills and weight loss.  HENT: Negative for congestion, hearing loss, sore throat and tinnitus.   Eyes: Negative for blurred vision, double vision and redness.  Respiratory: Negative for cough, hemoptysis and shortness of breath.   Cardiovascular: Negative for chest pain, palpitations, orthopnea, claudication and leg swelling.  Gastrointestinal: Negative for heartburn, nausea, vomiting, diarrhea, constipation and blood in stool.  Genitourinary: Negative for dysuria, urgency, frequency and hematuria.  Musculoskeletal: Negative for myalgias, back pain, joint pain, falls and neck pain.  Skin: Negative for itching.  Neurological: Negative for dizziness, tingling, tremors, focal weakness, seizures, loss of consciousness, weakness and headaches.  Endo/Heme/Allergies: Does not bruise/bleed easily.  Psychiatric/Behavioral: Negative for depression and substance abuse. The patient is not nervous/anxious and does not have insomnia.      Objective  Filed Vitals:   10/21/15 1018  BP: 132/78  Pulse: 91  Temp: 98.6 F (37 C)  Resp: 16  Height: 5\' 3"  (1.6 m)  Weight: 179 lb 4  oz (81.307 kg)  SpO2: 94%     Physical Exam  Constitutional: She is oriented to person, place, and time and well-developed, well-nourished, and in no distress.  HENT:  Head: Normocephalic.  Eyes: EOM are normal. Pupils are equal, round, and reactive to light.  Neck: Normal range of motion. No thyromegaly present.  Cardiovascular: Normal rate, regular rhythm and normal heart sounds.   No murmur heard. Pulmonary/Chest: Effort normal and breath sounds normal.  Abdominal: Soft. Bowel sounds are normal.  Musculoskeletal: Normal  range of motion. She exhibits no edema.  Neurological: She is alert and oriented to person, place, and time. No cranial nerve deficit. Gait normal.  Skin: Skin is warm and dry. No rash noted.  Psychiatric: Memory and affect normal.      Assessment & Plan  1. Need for influenza vaccination Given  - Flu vaccine HIGH DOSE PF (Fluzone High dose)  2. HLD (hyperlipidemia) Labs today - Comprehensive Metabolic Panel (CMET) - Lipid Profile - TSH  3. Tension headache Stable  4. Hyperglycemia Check glucose and A1c today - POCT HgB A1C - POCT Glucose (CBG)

## 2015-12-03 ENCOUNTER — Telehealth: Payer: Self-pay | Admitting: Family Medicine

## 2015-12-03 NOTE — Telephone Encounter (Signed)
Requesting refill on Fioricet. Please send to [facility]. She has scheduled appointment for next week

## 2015-12-05 MED ORDER — BUTALBITAL-APAP-CAFFEINE 50-325-40 MG PO TABS: 1.0000 | ORAL_TABLET | Freq: Four times a day (QID) | ORAL | Status: DC

## 2015-12-05 NOTE — Telephone Encounter (Signed)
Script phoned in # [phone]

## 2015-12-11 ENCOUNTER — Ambulatory Visit (INDEPENDENT_AMBULATORY_CARE_PROVIDER_SITE_OTHER): Payer: Medicare Other | Admitting: Family Medicine

## 2015-12-11 ENCOUNTER — Encounter: Payer: Self-pay | Admitting: Family Medicine

## 2015-12-11 VITALS — BP 132/68 | HR 78 | Temp 97.9°F | Resp 14 | Ht 63.0 in | Wt 173.9 lb

## 2015-12-11 DIAGNOSIS — G44209 Tension-type headache, unspecified, not intractable: Secondary | ICD-10-CM

## 2015-12-11 DIAGNOSIS — K5710 Diverticulosis of small intestine without perforation or abscess without bleeding: Secondary | ICD-10-CM

## 2015-12-11 DIAGNOSIS — K219 Gastro-esophageal reflux disease without esophagitis: Secondary | ICD-10-CM | POA: Diagnosis not present

## 2015-12-11 DIAGNOSIS — N3946 Mixed incontinence: Secondary | ICD-10-CM

## 2015-12-11 DIAGNOSIS — Z853 Personal history of malignant neoplasm of breast: Secondary | ICD-10-CM

## 2015-12-11 DIAGNOSIS — Z9889 Other specified postprocedural states: Secondary | ICD-10-CM | POA: Diagnosis not present

## 2015-12-11 DIAGNOSIS — Z0000 Encounter for general adult medical examination without abnormal findings: Secondary | ICD-10-CM | POA: Diagnosis not present

## 2015-12-11 DIAGNOSIS — I868 Varicose veins of other specified sites: Secondary | ICD-10-CM | POA: Diagnosis not present

## 2015-12-11 DIAGNOSIS — Z780 Asymptomatic menopausal state: Secondary | ICD-10-CM | POA: Insufficient documentation

## 2015-12-11 DIAGNOSIS — R739 Hyperglycemia, unspecified: Secondary | ICD-10-CM | POA: Diagnosis not present

## 2015-12-11 DIAGNOSIS — I8390 Asymptomatic varicose veins of unspecified lower extremity: Secondary | ICD-10-CM

## 2015-12-11 DIAGNOSIS — N3020 Other chronic cystitis without hematuria: Secondary | ICD-10-CM

## 2015-12-11 NOTE — Progress Notes (Signed)
Name: Christina Salas   MRN: [id number]    DOB: [birth year]   Date:12/11/2015       Progress Note  Subjective  Chief Complaint  Chief Complaint  Patient presents with  . Annual Exam    HPI  81 year old presents for annual H&P.  Depression screen [facility] 2/9 12/11/2015 10/21/2015 06/19/2015  Decreased Interest 0 0 1  Down, Depressed, Hopeless 0 0 0  PHQ - 2 Score 0 0 1    Functional Status Survey: Is the patient deaf or have difficulty hearing?: No Does the patient have difficulty seeing, even when wearing glasses/contacts?: No Does the patient have difficulty concentrating, remembering, or making decisions?: No Does the patient have difficulty walking or climbing stairs?: No Does the patient have difficulty dressing or bathing?: No Does the patient have difficulty doing errands alone such as visiting a doctor's office or shopping?: No   Fall Risk  12/11/2015 10/21/2015 06/19/2015  Falls in the past year? No No No    Past Medical History  Diagnosis Date  . GERD (gastroesophageal reflux disease)   . Cancer ([place])   . Arthritis   . Chronic headaches   . Osteoporosis     pre-osetoporosis per patient history form 03/18/12  . Breast cancer ([place])   . H/O melanoma excision   . Abnormal glucose   . Vitamin D deficiency   . History of recurrent UTIs   . History of diverticulitis     Social History  Substance Use Topics  . Smoking status: Never Smoker   . Smokeless tobacco: Not on file  . Alcohol Use: No     Current outpatient prescriptions:  .  butalbital-acetaminophen-caffeine (FIORICET, ESGIC) 50-325-40 MG tablet, Take 1 tablet by mouth 4 (four) times daily., Disp: 60 tablet, Rfl: 0 .  Cinnamon 500 MG capsule, Take 1 capsule by mouth daily., Disp: , Rfl:  .  Cranberry ([employer]S CRANBERRY) 500 MG CAPS, Take 1 tablet by mouth daily., Disp: , Rfl:  .  Ergocalciferol (VITAMIN D2) 2000 UNITS TABS, Take by mouth., Disp: , Rfl:  .  esomeprazole (NEXIUM) 40 MG capsule, Take 40 mg  by mouth daily before breakfast.  , Disp: , Rfl:  .  Multiple Vitamins-Calcium (ESSENTIAL ONE DAILY MULTIVIT) TABS, Take by mouth., Disp: , Rfl:  .  mupirocin cream (BACTROBAN) 2 %, BACTROBAN, 2% (External Cream)  1 (one) Cream Cream two times daily for 10 days  Quantity: 30;  Refills: 2   Ordered :03-Dec-2014  Hollie Salk ;  Started 11-Dec-2014 Active, Disp: , Rfl:  .  nitrofurantoin (MACRODANTIN) 100 MG capsule, Take 100 mg by mouth as needed.  , Disp: , Rfl:  .  omega-3 acid ethyl esters (LOVAZA) 1 G capsule, Take 2 g by mouth 2 (two) times daily., Disp: , Rfl:   No Known Allergies  Review of Systems  Constitutional: Negative for fever, chills and weight loss.  HENT: Negative for congestion, hearing loss, sore throat and tinnitus.   Eyes: Negative for blurred vision, double vision and redness.  Respiratory: Negative for cough, hemoptysis and shortness of breath.   Cardiovascular: Negative for chest pain, palpitations, orthopnea, claudication and leg swelling.  Gastrointestinal: Negative for heartburn, nausea, vomiting, diarrhea, constipation and blood in stool.  Genitourinary: Negative for dysuria, urgency, frequency and hematuria.  Musculoskeletal: Negative for myalgias, back pain, joint pain, falls and neck pain.  Skin: Positive for rash. Negative for itching.  Neurological: Positive for headaches. Negative for dizziness, tingling, tremors, focal weakness, seizures, loss of  consciousness and weakness.  Endo/Heme/Allergies: Bruises/bleeds easily.  Psychiatric/Behavioral: Negative for depression and substance abuse. The patient is not nervous/anxious and does not have insomnia.      Objective  Filed Vitals:   12/11/15 0944  BP: 132/68  Pulse: 78  Temp: 97.9 F (36.6 C)  TempSrc: Oral  Resp: 14  Height: 5\' 3"  (1.6 m)  Weight: 173 lb 14.4 oz (78.881 kg)  SpO2: 93%     Physical Exam  Constitutional: She is oriented to person, place, and time and well-developed,  well-nourished, and in no distress.  HENT:  Head: Normocephalic.  Eyes: EOM are normal. Pupils are equal, round, and reactive to light.  Neck: Normal range of motion. No thyromegaly present.  Cardiovascular: Normal rate, regular rhythm, normal heart sounds and intact distal pulses.   No murmur heard. Pulmonary/Chest: Effort normal and breath sounds normal.  Left anterior chest shows old surgical scarring from radical mastectomy. No masses are palpable right breast without asymmetry dimpling  Abdominal: Soft. Bowel sounds are normal.  Musculoskeletal: Normal range of motion. She exhibits no edema.  Neurological: She is alert and oriented to person, place, and time. No cranial nerve deficit. Gait normal.  Skin: Skin is warm and dry. No rash noted.  Multiple seborrheic keratosis and pigmented nevi and cherry angiomata are noted. There is also an old surgical scar on the scalp from prior melanoma excision  Psychiatric: Memory and affect normal.      Assessment & Plan  1. Annual physical exam  - DG Bone Density; Future  2. Varicose veins Stable and she uses support hose  3. Gastroesophageal reflux disease without esophagitis Stable on current meds  4. Diverticulosis of small intestine without hemorrhage Stable  5. Bladder infection, chronic Stable  6. Tension headache Stable refer to when necessary severe 7  7. Mixed incontinence   8. Hyperglycemia Labs on return visit  9. History of breast cancer, left, T1, N0, Left MRM - Dec 1996 Mammogram in spring  10. H/O surgical procedure Stable  11. Post-menopausal  - DG Bone Density; Future

## 2015-12-31 ENCOUNTER — Other Ambulatory Visit: Payer: Self-pay | Admitting: Family Medicine

## 2016-02-17 ENCOUNTER — Telehealth: Payer: Self-pay | Admitting: Family Medicine

## 2016-02-17 NOTE — Telephone Encounter (Signed)
Patient is requesting a refill on Fioricet, Esgic 50-325-40.  Patient has been r/s to see Dr. Rutherford Nail on 03/31/16.

## 2016-02-18 NOTE — Telephone Encounter (Signed)
Patient called and asked that you disregard the refill request she has a prescription already on file

## 2016-02-24 ENCOUNTER — Ambulatory Visit: Payer: Medicare Other | Admitting: Family Medicine

## 2016-03-05 ENCOUNTER — Telehealth: Payer: Self-pay

## 2016-03-05 NOTE — Telephone Encounter (Signed)
[facility] called and notified me that the Decision was overturned about her butalbital-acetaminophen-caffeine (Generic Fioricet) and it was approved. They'll be mailing our information about this in about 3 days.

## 2016-03-10 ENCOUNTER — Other Ambulatory Visit: Payer: Self-pay

## 2016-03-10 DIAGNOSIS — Z1231 Encounter for screening mammogram for malignant neoplasm of breast: Secondary | ICD-10-CM

## 2016-03-12 ENCOUNTER — Telehealth: Payer: Self-pay | Admitting: Family Medicine

## 2016-03-12 DIAGNOSIS — R0789 Other chest pain: Secondary | ICD-10-CM

## 2016-03-12 NOTE — Telephone Encounter (Signed)
Patient stated she was having some indigestion and chest discomfort. Would like referral to Cardiology. Appointment made and patient notified. Patient advised to go to ER if symptoms persist

## 2016-03-31 ENCOUNTER — Ambulatory Visit: Payer: Medicare Other | Admitting: Family Medicine

## 2016-04-14 ENCOUNTER — Ambulatory Visit
Admission: RE | Admit: 2016-04-14 | Discharge: 2016-04-14 | Disposition: A | Discharge status: Home / Self Care | Payer: Medicare Other | Source: Ambulatory Visit

## 2016-04-14 DIAGNOSIS — Z1231 Encounter for screening mammogram for malignant neoplasm of breast: Secondary | ICD-10-CM

## 2016-05-07 ENCOUNTER — Telehealth: Payer: Self-pay | Admitting: Family Medicine

## 2016-05-07 NOTE — Telephone Encounter (Signed)
Requesting return call pertaining to her medications and wanted to update you on some things

## 2016-05-18 ENCOUNTER — Telehealth: Payer: Self-pay | Admitting: Emergency Medicine

## 2016-05-18 NOTE — Telephone Encounter (Signed)
Need order sent to [facility] for bras [phone]

## 2016-06-08 ENCOUNTER — Encounter: Payer: Self-pay | Admitting: Family Medicine

## 2016-06-08 ENCOUNTER — Ambulatory Visit (INDEPENDENT_AMBULATORY_CARE_PROVIDER_SITE_OTHER): Payer: Medicare Other | Admitting: Family Medicine

## 2016-06-08 VITALS — BP 126/68 | HR 86 | Temp 98.1°F | Resp 18 | Ht 63.0 in | Wt 174.1 lb

## 2016-06-08 DIAGNOSIS — E669 Obesity, unspecified: Secondary | ICD-10-CM

## 2016-06-08 DIAGNOSIS — R7303 Prediabetes: Secondary | ICD-10-CM

## 2016-06-08 DIAGNOSIS — L255 Unspecified contact dermatitis due to plants, except food: Secondary | ICD-10-CM | POA: Diagnosis not present

## 2016-06-08 DIAGNOSIS — E559 Vitamin D deficiency, unspecified: Secondary | ICD-10-CM

## 2016-06-08 DIAGNOSIS — E785 Hyperlipidemia, unspecified: Secondary | ICD-10-CM

## 2016-06-08 DIAGNOSIS — E66811 Obesity, class 1: Secondary | ICD-10-CM

## 2016-06-08 DIAGNOSIS — K219 Gastro-esophageal reflux disease without esophagitis: Secondary | ICD-10-CM

## 2016-06-08 DIAGNOSIS — N390 Urinary tract infection, site not specified: Secondary | ICD-10-CM | POA: Diagnosis not present

## 2016-06-08 MED ORDER — BUTALBITAL-APAP-CAFFEINE 50-325-40 MG PO TABS: 1.0000 | ORAL_TABLET | Freq: Every day | ORAL | Status: DC | PRN

## 2016-06-08 MED ORDER — PREDNISONE 10 MG PO TABS: 10.0000 mg | ORAL_TABLET | Freq: Two times a day (BID) | ORAL | Status: DC

## 2016-06-09 NOTE — Progress Notes (Signed)
Date:  06/08/2016   Name:  Christina Salas   DOB:  [birth year]   MRN:  [id number]  PCP:  Ashok Norris, MD    Chief Complaint: Leg Pain and Rash   History of Present Illness:  This is a 82 y.o. female seen for six month f/u. C/o poison ivy past 10d, seen urgent care and given shot and topical steroid but rash does not seem to be improving. Also c/o intermittent R back and leg pain for which she takes Aleve or Excedrin. Some BLE edema which improves at night. Sees urology for recurrent UTI's, takes vit D supp prn, told prediabetes in past, uses Fioricet prn for headaches. On Lovaza for HLD, on Nexium x years, has not tried to taper.  Review of Systems:  Review of Systems  Constitutional: Negative for fever and fatigue.  Respiratory: Negative for cough and shortness of breath.   Cardiovascular: Negative for chest pain and leg swelling.  Endocrine: Negative for polyuria.  Genitourinary: Negative for difficulty urinating.  Neurological: Negative for syncope and light-headedness.    Patient Active Problem List   Diagnosis Date Noted  . Prediabetes 06/08/2016  . Obesity (BMI 30.0-34.9) 06/08/2016  . Vitamin D deficiency 06/08/2016  . Post-menopausal 12/11/2015  . Breast cancer ([place]) 06/19/2015  . DD (diverticular disease) 06/19/2015  . Can't get food down 06/19/2015  . Edema extremities 06/19/2015  . Esophageal reflux 06/19/2015  . H/O surgical procedure 06/19/2015  . HLD (hyperlipidemia) 06/19/2015  . Varicose veins 06/19/2015  . Recurrent UTI 02/08/2013  . Mixed incontinence 02/08/2013  . History of breast cancer, left, T1, N0, Left MRM - Dec 1996 03/18/2012  . Tension headache 05/24/2007    Prior to Admission medications   Medication Sig Start Date End Date Taking? Authorizing Provider  butalbital-acetaminophen-caffeine (FIORICET, ESGIC) 50-325-40 MG tablet Take 1 tablet by mouth daily as needed (as needed). 06/08/16   Adline Potter, MD  Cinnamon 500 MG capsule Take 1  capsule by mouth daily.    Historical Provider, MD  Cranberry ([employer] CRANBERRY) 500 MG CAPS Take 1 tablet by mouth daily.    Historical Provider, MD  Ergocalciferol (VITAMIN D2) 2000 UNITS TABS Take by mouth.    Historical Provider, MD  esomeprazole (NEXIUM) 40 MG capsule take 1 capsule by mouth once daily 12/31/15   Ashok Norris, MD  Multiple Vitamins-Calcium (ESSENTIAL ONE DAILY MULTIVIT) TABS Take by mouth.    Historical Provider, MD  nitrofurantoin (MACRODANTIN) 100 MG capsule Take 100 mg by mouth as needed.      Historical Provider, MD  omega-3 acid ethyl esters (LOVAZA) 1 G capsule Take 2 g by mouth 2 (two) times daily.    Historical Provider, MD  predniSONE (DELTASONE) 10 MG tablet Take 1 tablet (10 mg total) by mouth 2 (two) times daily with a meal. 06/08/16   Adline Potter, MD    No Known Allergies  Past Surgical History  Procedure Laterality Date  . Melanoma excision  1995    Head  . Mastectomy  1996    Left  . Abdominal hysterectomy  2001  . Colonoscopy  04/02/2014  . Cystoscopy  11/2014    Social History  Substance Use Topics  . Smoking status: Never Smoker   . Smokeless tobacco: None  . Alcohol Use: No    Family History  Problem Relation Age of Onset  . Breast cancer Mother   . Diabetes Father   . CAD Father   . Diabetes Father   . Breast  cancer      Niece  . Cirrhosis Brother     Medication list has been reviewed and updated.  Physical Examination: BP 126/68 mmHg  Pulse 86  Temp(Src) 98.1 F (36.7 C)  Resp 18  Ht 5\' 3"  (1.6 m)  Wt 174 lb 2 oz (78.983 kg)  BMI 30.85 kg/m2  SpO2 96%  Physical Exam  Constitutional: She appears well-developed and well-nourished.  Cardiovascular: Normal rate, regular rhythm and normal heart sounds.   Pulmonary/Chest: Effort normal and breath sounds normal.  Musculoskeletal: She exhibits no edema.  Neurological: She is alert.  Skin: Skin is warm and dry.  Psychiatric: She has a normal mood and affect. Her behavior is  normal.  Nursing note and vitals reviewed.   Assessment and Plan:  1. Rhus dermatitis Prednisone 10 mg bid x 5d, call if rash persists/recurs  2. Prediabetes On cinnamon - HgB A1c  3. HLD (hyperlipidemia) On Lovaza - Lipid Profile  4. Gastroesophageal reflux disease, esophagitis presence not specified On LT Nexium, will attempt to taper off or change to OTC Zantac - B12  5. Recurrent UTI On Macrodantin and cranberry caps, followed by urology  6. Vitamin D deficiency On supplement prn - Vitamin D (25 hydroxy)  7. Obesity (BMI 30.0-34.9) Exercise, weight loss discussed - Comprehensive metabolic panel - CBC - TSH  Return in about 6 months (around 12/08/2016).  Satira Anis. [facility]  06/09/2016

## 2016-06-20 LAB — COMPREHENSIVE METABOLIC PANEL
A/G RATIO: 1.5 (ref 1.2–2.2)
ALT: 15 IU/L (ref 0–32)
AST: 17 IU/L (ref 0–40)
Albumin: 4 g/dL (ref 3.5–4.7)
Alkaline Phosphatase: 67 IU/L (ref 39–117)
BUN/Creatinine Ratio: 29 — ABNORMAL HIGH (ref 12–28)
BUN: 27 mg/dL (ref 8–27)
Bilirubin Total: 0.3 mg/dL (ref 0.0–1.2)
CALCIUM: 9.4 mg/dL (ref 8.7–10.3)
CO2: 19 mmol/L (ref 18–29)
Chloride: 104 mmol/L (ref 96–106)
Creatinine, Ser: 0.92 mg/dL (ref 0.57–1.00)
GFR calc Af Amer: 68 mL/min/{1.73_m2} (ref >59)
GFR, EST NON AFRICAN AMERICAN: 59 mL/min/{1.73_m2} — AB (ref >59)
GLUCOSE: 99 mg/dL (ref 65–99)
Globulin, Total: 2.7 g/dL (ref 1.5–4.5)
POTASSIUM: 4.1 mmol/L (ref 3.5–5.2)
Sodium: 142 mmol/L (ref 134–144)
Total Protein: 6.7 g/dL (ref 6.0–8.5)

## 2016-06-20 LAB — VITAMIN B12: Vitamin B-12: 958 pg/mL — ABNORMAL HIGH (ref 211–946)

## 2016-06-20 LAB — LIPID PANEL
CHOLESTEROL TOTAL: 161 mg/dL (ref 100–199)
Chol/HDL Ratio: 2.9 ratio units (ref 0.0–4.4)
HDL: 55 mg/dL (ref >39)
LDL Calculated: 80 mg/dL (ref 0–99)
Triglycerides: 129 mg/dL (ref 0–149)
VLDL CHOLESTEROL CAL: 26 mg/dL (ref 5–40)

## 2016-06-20 LAB — TSH: TSH: 1.24 u[IU]/mL (ref 0.450–4.500)

## 2016-06-20 LAB — HEMOGLOBIN A1C
ESTIMATED AVERAGE GLUCOSE: 123 mg/dL
HEMOGLOBIN A1C: 5.9 % — AB (ref 4.8–5.6)

## 2016-06-20 LAB — CBC
HEMATOCRIT: 37 % (ref 34.0–46.6)
HEMOGLOBIN: 12.3 g/dL (ref 11.1–15.9)
MCH: 27.3 pg (ref 26.6–33.0)
MCHC: 33.2 g/dL (ref 31.5–35.7)
MCV: 82 fL (ref 79–97)
Platelets: 228 10*3/uL (ref 150–379)
RBC: 4.51 x10E6/uL (ref 3.77–5.28)
RDW: 15.6 % — ABNORMAL HIGH (ref 12.3–15.4)
WBC: 5.9 10*3/uL (ref 3.4–10.8)

## 2016-06-20 LAB — VITAMIN D 25 HYDROXY (VIT D DEFICIENCY, FRACTURES): VIT D 25 HYDROXY: 33.2 ng/mL (ref 30.0–100.0)

## 2016-06-20 NOTE — Addendum Note (Signed)
Addended by: Adline Potter on: 06/20/2016 12:55 PM   Modules accepted: Orders, Medications

## 2016-09-28 ENCOUNTER — Telehealth: Payer: Self-pay

## 2016-09-28 ENCOUNTER — Ambulatory Visit (INDEPENDENT_AMBULATORY_CARE_PROVIDER_SITE_OTHER): Payer: Medicare Other | Admitting: Family Medicine

## 2016-09-28 ENCOUNTER — Encounter: Payer: Self-pay | Admitting: Family Medicine

## 2016-09-28 VITALS — BP 124/60 | HR 89 | Temp 98.2°F | Resp 16 | Ht 63.0 in | Wt 178.1 lb

## 2016-09-28 DIAGNOSIS — R600 Localized edema: Secondary | ICD-10-CM

## 2016-09-28 DIAGNOSIS — Z23 Encounter for immunization: Secondary | ICD-10-CM | POA: Diagnosis not present

## 2016-09-28 DIAGNOSIS — E784 Other hyperlipidemia: Secondary | ICD-10-CM

## 2016-09-28 DIAGNOSIS — G44209 Tension-type headache, unspecified, not intractable: Secondary | ICD-10-CM | POA: Diagnosis not present

## 2016-09-28 DIAGNOSIS — M5431 Sciatica, right side: Secondary | ICD-10-CM

## 2016-09-28 DIAGNOSIS — K219 Gastro-esophageal reflux disease without esophagitis: Secondary | ICD-10-CM

## 2016-09-28 DIAGNOSIS — E7849 Other hyperlipidemia: Secondary | ICD-10-CM

## 2016-09-28 MED ORDER — BUTALBITAL-APAP-CAFFEINE 50-325-40 MG PO TABS: 1.0000 | ORAL_TABLET | Freq: Four times a day (QID) | ORAL | 0 refills | Status: DC | PRN

## 2016-09-28 NOTE — Telephone Encounter (Signed)
Patient ask for Dr. Ancil Boozer to just sent in this one prescription-Buta-Ace-Caffeine to [facility]. Please. Thanks

## 2016-09-28 NOTE — Telephone Encounter (Signed)
Patient usually uses [facility] and wants all her other prescriptions to go there, but this one I worked up and sent to you to sent to [facility] per her request.

## 2016-09-28 NOTE — Telephone Encounter (Signed)
I did during her visit. Did you go to the wrong pharmacy. Please call and correct it. Thank you

## 2016-09-28 NOTE — Progress Notes (Signed)
Name: Christina Salas   MRN: [id number]    DOB: 06-Apr-1933   Date:09/28/2016       Progress Note  Subjective  Chief Complaint  Chief Complaint  Patient presents with  . Leg Pain    pt stated that both legs beneath the knees are painful and swellimg. Wears her compression hose occasionally but symptoms are getting worse    HPI  Lower extremity edema: she states she has a long history of lower extremity edema. She states she noticed worsening of swelling when walking for a prolonged period of time - such as when she goes grocery shopping. She states compression stocking hoses improves the symptoms but not sure if she needs to wear all the time. Pain is intermittent/burning like and when she applies pressure it causes pain, no redness. Swelling is worse at night and better during the day. She denies orthopnea or chest pain, recently seen by Dr. Clayborn Bigness for palpitation and work up was negative  GERD: explained risk of colitis, Alzheimer's and bone loss, but she states she has tried weaning self off but she develops indigestion and heartburn.  Tension Headaches: she has been taking a half Excedrin twice daily to control symptoms. Explained that she may have rebound headache. Explained that she needs to stop taking it daily and symptoms will get worse. Headache is described as frontal, sharp and in the past associated with nausea. She states Fioricet was only using prn, but last rx did not seem to work  Right sciatica: she states that symptoms were very intense this past Summer, but it has improved, however still has difficulty going and down stairs and feels weaker on the right lower leg, at times pain shoots down right lateral leg  Patient Active Problem List   Diagnosis Date Noted  . Prediabetes 06/08/2016  . Obesity (BMI 30.0-34.9) 06/08/2016  . Vitamin D deficiency 06/08/2016  . Post-menopausal 12/11/2015  . DD (diverticular disease) 06/19/2015  . Edema extremities 06/19/2015  .  Esophageal reflux 06/19/2015  . HLD (hyperlipidemia) 06/19/2015  . Varicose veins 06/19/2015  . Recurrent UTI 02/08/2013  . Mixed incontinence 02/08/2013  . History of breast cancer, left, T1, N0, Left MRM - Dec 1996 03/18/2012  . Tension headache 05/24/2007    Past Surgical History:  Procedure Laterality Date  . ABDOMINAL HYSTERECTOMY  2001  . COLONOSCOPY  04/02/2014  . CYSTOSCOPY  11/2014  . MASTECTOMY  1996   Left  . MELANOMA EXCISION  1995   Head    Family History  Problem Relation Age of Onset  . Breast cancer Mother   . Diabetes Father   . CAD Father   . Breast cancer      Niece  . Cirrhosis Brother     Social History   Social History  . Marital status: Single    Spouse name: N/A  . Number of children: 0  . Years of education: N/A   Occupational History  . Retired    Social History Main Topics  . Smoking status: Never Smoker  . Smokeless tobacco: Never Used  . Alcohol use No  . Drug use: No  . Sexual activity: Not on file   Other Topics Concern  . Not on file   Social History Narrative  . No narrative on file     Current Outpatient Prescriptions:  .  butalbital-acetaminophen-caffeine (FIORICET, ESGIC) 50-325-40 MG tablet, Take 1 tablet by mouth daily as needed (as needed)., Disp: 60 tablet, Rfl: 0 .  Cinnamon  500 MG capsule, Take 1 capsule by mouth daily., Disp: , Rfl:  .  Cranberry ([employer] CRANBERRY) 500 MG CAPS, Take 1 tablet by mouth daily., Disp: , Rfl:  .  Ergocalciferol (VITAMIN D2) 2000 UNITS TABS, Take by mouth., Disp: , Rfl:  .  esomeprazole (NEXIUM) 40 MG capsule, take 1 capsule by mouth once daily, Disp: 30 capsule, Rfl: 11 .  Multiple Vitamins-Calcium (ESSENTIAL ONE DAILY MULTIVIT) TABS, Take by mouth., Disp: , Rfl:  .  nitrofurantoin (MACRODANTIN) 100 MG capsule, Take 100 mg by mouth as needed.  , Disp: , Rfl:   No Known Allergies   ROS  Ten systems reviewed and is negative except as mentioned in HPI    Objective  Vitals:    09/28/16 1056  BP: 124/60  Pulse: 89  Resp: 16  Temp: 98.2 F (36.8 C)  SpO2: 96%  Weight: 178 lb 1 oz (80.8 kg)  Height: 5\' 3"  (1.6 m)    Body mass index is 31.54 kg/m.  Physical Exam  Constitutional: Patient appears well-developed and well-nourished. Obese  No distress.  HEENT: head atraumatic, normocephalic, pupils equal and reactive to light, , neck supple, throat within normal limits Cardiovascular: Normal rate, regular rhythm and normal heart sounds.  No murmur heard. BLE edema 2 plus. Pulmonary/Chest: Effort normal and breath sounds normal. No respiratory distress. Abdominal: Soft.  There is no tenderness. Psychiatric: Patient has a normal mood and affect. behavior is normal. Judgment and thought content normal. Muscular Skeletal: pain during palpation of right lower back, negative straight leg raise, normal rom of hip without pain  PHQ2/9: Depression screen [facility] 2/9 09/28/2016 06/08/2016 12/11/2015 10/21/2015 06/19/2015  Decreased Interest 0 0 0 0 1  Down, Depressed, Hopeless 0 0 0 0 0  PHQ - 2 Score 0 0 0 0 1     Fall Risk: Fall Risk  09/28/2016 06/08/2016 12/11/2015 10/21/2015 06/19/2015  Falls in the past year? Yes No No No No  Number falls in past yr: 1 - - - -  Injury with Fall? No - - - -     Functional Status Survey: Is the patient deaf or have difficulty hearing?: No Does the patient have difficulty seeing, even when wearing glasses/contacts?: No Does the patient have difficulty concentrating, remembering, or making decisions?: No Does the patient have difficulty walking or climbing stairs?: Yes Does the patient have difficulty dressing or bathing?: No Does the patient have difficulty doing errands alone such as visiting a doctor's office or shopping?: No    Assessment & Plan  1. Tension headache  - butalbital-acetaminophen-caffeine (FIORICET, ESGIC) 50-325-40 MG tablet; Take 1 tablet by mouth every 6 (six) hours as needed (as needed).  Dispense: 20  tablet; Refill: 0  2. Need for influenza vaccination  - Flu vaccine HIGH DOSE PF (Fluzone High dose)  3. Lower extremity edema  Use compression stocking hose Discussed vascular surgeon referral but she wants to hold off  4. Other hyperlipidemia  Doing well, reviewed last labs  5. Gastroesophageal reflux disease, esophagitis presence not specified  She states she has not been able to quit in the past   6. Right sciatic nerve pain  - Ambulatory referral to Physical Therapy

## 2016-12-08 ENCOUNTER — Ambulatory Visit: Payer: Medicare Other | Admitting: Family Medicine

## 2016-12-16 ENCOUNTER — Ambulatory Visit (INDEPENDENT_AMBULATORY_CARE_PROVIDER_SITE_OTHER): Payer: Medicare Other | Admitting: Family Medicine

## 2016-12-16 ENCOUNTER — Encounter: Payer: Self-pay | Admitting: Family Medicine

## 2016-12-16 VITALS — BP 136/68 | HR 98 | Temp 97.7°F | Resp 16 | Ht 62.0 in | Wt 180.1 lb

## 2016-12-16 DIAGNOSIS — M5431 Sciatica, right side: Secondary | ICD-10-CM | POA: Diagnosis not present

## 2016-12-16 DIAGNOSIS — M170 Bilateral primary osteoarthritis of knee: Secondary | ICD-10-CM

## 2016-12-16 DIAGNOSIS — H9193 Unspecified hearing loss, bilateral: Secondary | ICD-10-CM | POA: Diagnosis not present

## 2016-12-16 DIAGNOSIS — R2681 Unsteadiness on feet: Secondary | ICD-10-CM

## 2016-12-16 DIAGNOSIS — Z0000 Encounter for general adult medical examination without abnormal findings: Secondary | ICD-10-CM | POA: Diagnosis not present

## 2016-12-16 DIAGNOSIS — R600 Localized edema: Secondary | ICD-10-CM

## 2016-12-16 MED ORDER — ASPIRIN EC 81 MG PO TBEC: 81.0000 mg | DELAYED_RELEASE_TABLET | Freq: Every day | ORAL | 0 refills | Status: DC

## 2016-12-16 NOTE — Progress Notes (Signed)
Name: Christina Salas   MRN: [id number]    DOB: 01-27-1933   Date:12/16/2016       Progress Note  Subjective  Chief Complaint  Chief Complaint  Patient presents with  . Medicare Wellness    HPI  Functional ability/safety issues: No Issues - she states she is slow and will hire someone to help her clean, she still cooks Hearing issues: noticed decrease in hearing, we will refer her to ENT Activities of daily living: Discussed Home safety issues: No Issues  End Of Life Planning: Offered verbal information regarding advanced directives, healthcare power of attorney.  Preventative care, Health maintenance, Preventative health measures discussed.  Preventative screenings discussed today: lab work, colonoscopy,  mammogram, DEXA.  Low Dose CT Chest recommended if Age 37-80 years, 30 pack-year currently smoking OR have quit w/in 15years.   Lifestyle risk factor issued reviewed: Diet, exercise, weight management, advised patient smoking is not healthy, nutrition/diet.  Preventative health measures discussed (5-10 year plan).  Reviewed and recommended vaccinations: - Pneumovax  - Prevnar  - Annual Influenza - Zostavax - Tdap   Depression screening: Done Fall risk screening: Done Discuss ADLs/IADLs: Done  Current medical providers: See HPI  Other health risk factors identified this visit: No other issues Cognitive impairment issues: None identified  All above discussed with patient. Appropriate education, counseling and referral will be made based upon the above.    Lower extremity edema: she states that elevating her legs and wearing compression stocking hoses improves symptoms  Right sciatica/OA knee: she did not get a call about her PT, she states symptoms are stable, she also has OA and has daily bilateral knee pain and stiffness, she states it is painful to go up and down stairs. Mild effusion on knees. She is taking Tylenol prn for pain and occasionally Aleve and  more active  Gait instability: recent fall, on her porch,  fell on her buttocks, no injury. She has noticed some change in her balance    Patient Active Problem List   Diagnosis Date Noted  . Prediabetes 06/08/2016  . Obesity (BMI 30.0-34.9) 06/08/2016  . Vitamin D deficiency 06/08/2016  . Post-menopausal 12/11/2015  . DD (diverticular disease) 06/19/2015  . Edema extremities 06/19/2015  . Esophageal reflux 06/19/2015  . HLD (hyperlipidemia) 06/19/2015  . Varicose veins 06/19/2015  . Recurrent UTI 02/08/2013  . Mixed incontinence 02/08/2013  . History of breast cancer, left, T1, N0, Left MRM - Dec 1996 03/18/2012  . Tension headache 05/24/2007    Past Surgical History:  Procedure Laterality Date  . ABDOMINAL HYSTERECTOMY  2001  . COLONOSCOPY  04/02/2014  . CYSTOSCOPY  11/2014  . MASTECTOMY  1996   Left  . MELANOMA EXCISION  1995   Head    Family History  Problem Relation Age of Onset  . Breast cancer Mother   . Diabetes Father   . CAD Father   . Breast cancer      Niece  . Cirrhosis Brother     Social History   Social History  . Marital status: Single    Spouse name: N/A  . Number of children: 0  . Years of education: N/A   Occupational History  . Retired    Social History Main Topics  . Smoking status: Never Smoker  . Smokeless tobacco: Never Used  . Alcohol use No  . Drug use: No  . Sexual activity: Not Currently   Other Topics Concern  . Not on file   Social  History Narrative  . No narrative on file     Current Outpatient Prescriptions:  .  butalbital-acetaminophen-caffeine (FIORICET, ESGIC) 50-325-40 MG tablet, Take 1 tablet by mouth every 6 (six) hours as needed (as needed)., Disp: 20 tablet, Rfl: 0 .  Cinnamon 500 MG capsule, Take 1 capsule by mouth daily., Disp: , Rfl:  .  Cranberry ([employer] CRANBERRY) 500 MG CAPS, Take 1 tablet by mouth daily., Disp: , Rfl:  .  Ergocalciferol (VITAMIN D2) 2000 UNITS TABS, Take by mouth., Disp: , Rfl:  .   esomeprazole (NEXIUM) 40 MG capsule, take 1 capsule by mouth once daily, Disp: 30 capsule, Rfl: 11 .  Multiple Vitamins-Calcium (ESSENTIAL ONE DAILY MULTIVIT) TABS, Take by mouth., Disp: , Rfl:  .  nitrofurantoin (MACRODANTIN) 100 MG capsule, Take 100 mg by mouth as needed.  , Disp: , Rfl:  .  Omega-3 1000 MG CAPS, Take 1 capsule by mouth 2 (two) times daily., Disp: , Rfl:   No Known Allergies   ROS  Constitutional: Negative for fever or weight change.  Respiratory: Negative for cough and shortness of breath.   Cardiovascular: Negative for chest pain or palpitations.  Gastrointestinal: Negative for abdominal pain, no bowel changes.  Musculoskeletal: Positive for gait problem and  joint swelling.  Skin: Negative for rash.  Neurological: Negative for dizziness , positive for intermittent headache.  No other specific complaints in a complete review of systems (except as listed in HPI above).   Objective  Vitals:   12/16/16 1108  BP: 136/68  Pulse: 98  Resp: 16  Temp: 97.7 F (36.5 C)  TempSrc: Oral  SpO2: 93%  Weight: 180 lb 1.6 oz (81.7 kg)  Height: 5\' 2"  (1.575 m)    Body mass index is 32.94 kg/m.  Physical Exam  Constitutional: Patient appears well-developed and well-nourished. No distress.  HENT: Head: Normocephalic and atraumatic. Ears: B TMs ok, no erythema or effusion; Nose: Nose normal. Mouth/Throat: Oropharynx is clear and moist. No oropharyngeal exudate.  Eyes: Conjunctivae and EOM are normal. Pupils are equal, round, and reactive to light. No scleral icterus.  Neck: Normal range of motion. Neck supple. No JVD present. No thyromegaly present.  Cardiovascular: Normal rate, regular rhythm and normal heart sounds.  No murmur heard. positive  BLE edema. Pulmonary/Chest: Effort normal and breath sounds normal. No respiratory distress. Abdominal: Soft. Bowel sounds are normal, no distension. There is no tenderness. no masses Breast: no lumps or masses, no nipple  discharge or rashes FEMALE GENITALIA:  Not done RECTAL: not done Musculoskeletal: crepitus with extension of both knees, decrease in som, negative straight leg raise Neurological: he is alert and oriented to person, place, and time. No cranial nerve deficit. Coordination, balance, strength, speech and gait are normal.  Skin: Skin is warm and dry. No rash noted. No erythema.  Psychiatric: Patient has a normal mood and affect. behavior is normal. Judgment and thought content normal.   PHQ2/9: Depression screen [facility] 2/9 09/28/2016 06/08/2016 12/11/2015 10/21/2015 06/19/2015  Decreased Interest 0 0 0 0 1  Down, Depressed, Hopeless 0 0 0 0 0  PHQ - 2 Score 0 0 0 0 1    Fall Risk: Fall Risk  12/16/2016 09/28/2016 06/08/2016 12/11/2015 10/21/2015  Falls in the past year? Yes Yes No No No  Number falls in past yr: 1 1 - - -  Injury with Fall? No No - - -    Functional Status Survey: Is the patient deaf or have difficulty hearing?: Yes Does the  patient have difficulty seeing, even when wearing glasses/contacts?: No Does the patient have difficulty concentrating, remembering, or making decisions?: No Does the patient have difficulty walking or climbing stairs?: Yes Does the patient have difficulty dressing or bathing?: No Does the patient have difficulty doing errands alone such as visiting a doctor's office or shopping?: No    Assessment & Plan  1. Medicare annual wellness visit, subsequent  Discussed importance of 150 minutes of physical activity weekly, eat two servings of fish weekly, eat one serving of tree nuts ( cashews, pistachios, pecans, almonds.[name]) every other day, eat 6 servings of fruit/vegetables daily and drink plenty of water and avoid sweet beverages.   2. Right sciatic nerve pain  -we will make referral to PT again   3. Lower extremity edema  Continue compression stocking hoses  4. Bilateral primary osteoarthritis of knee  PT   5. Bilateral hearing loss,  unspecified hearing loss type  -ENT  6. Gait instability  - Ambulatory referral to Physical Therapy Recent fall - October 2017 -

## 2017-01-05 ENCOUNTER — Ambulatory Visit: Payer: Medicare Other

## 2017-01-13 ENCOUNTER — Ambulatory Visit: Payer: Medicare Other

## 2017-01-20 ENCOUNTER — Other Ambulatory Visit: Payer: Self-pay | Admitting: Family Medicine

## 2017-01-21 ENCOUNTER — Ambulatory Visit: Payer: Medicare Other | Attending: Family Medicine

## 2017-01-21 ENCOUNTER — Telehealth: Payer: Self-pay | Admitting: Family Medicine

## 2017-01-21 DIAGNOSIS — R262 Difficulty in walking, not elsewhere classified: Secondary | ICD-10-CM | POA: Insufficient documentation

## 2017-01-21 DIAGNOSIS — R2689 Other abnormalities of gait and mobility: Secondary | ICD-10-CM | POA: Insufficient documentation

## 2017-01-21 DIAGNOSIS — M6281 Muscle weakness (generalized): Secondary | ICD-10-CM | POA: Diagnosis present

## 2017-01-21 NOTE — Telephone Encounter (Signed)
Patient requesting refill of Nexium to [facility].

## 2017-01-21 NOTE — Therapy (Signed)
[place] MAIN [facility] SERVICES [address] [place], [place], [address] Phone: [phone]   Fax:  [phone]  Physical Therapy Evaluation  Patient Details  Name: Christina Salas MRN: [id number] Date of Birth: [birth year] Referring Provider: Willodean Rosenthal MD  Encounter Date: 01/21/2017      PT End of Session - 01/21/17 1543    Visit Number 1   Number of Visits 16   Date for PT Re-Evaluation 03/18/17   Authorization Type 1/10 G code   PT Start Time 1430   PT Stop Time 1530   PT Time Calculation (min) 60 min   Equipment Utilized During Treatment Gait belt   Activity Tolerance Patient tolerated treatment well   Behavior During Therapy [facility] for tasks assessed/performed      Past Medical History:  Diagnosis Date  . Abnormal glucose   . Arthritis   . Breast cancer ([place])   . Cancer ([place])   . Chronic headaches   . GERD (gastroesophageal reflux disease)   . H/O melanoma excision   . History of diverticulitis   . History of recurrent UTIs   . Osteoporosis    pre-osetoporosis per patient history form 03/18/12  . Vitamin D deficiency     Past Surgical History:  Procedure Laterality Date  . ABDOMINAL HYSTERECTOMY  2001  . COLONOSCOPY  04/02/2014  . CYSTOSCOPY  11/2014  . MASTECTOMY  1996   Left  . MELANOMA EXCISION  1995   Head    There were no vitals filed for this visit.       Subjective Assessment - 01/21/17 1437    Subjective Patient reports increased balance difficulties and decreased endurance when walking. Patient reports increased difficulty with walking, ascending/descending stairs, balancing in narrow BOS, transferring from sitting to standing, requires to sit down to donn/doff pants, Patient reports difficulty sleeping states she wakes up secondary to a burning sensation into bilateral LEs. Patient states she has L knee pain which at it's worst is a 7/10 and at best 0/10 (currently 0/10 pain)   Pertinent History  Resting angina, GERD, HAs, patient reports bilateral swelling in Lower LEs B, Patient reports R sided sciatic pain with symptoms radiaiting down R LE into her knee.   Limitations Standing;Lifting;Walking;Other (comment)  Stairs   How long can you walk comfortably? 25min   Currently in Pain? Yes   Pain Score 0-No pain   Pain Location Knee   Pain Orientation Left   Pain Descriptors / Indicators Aching   Pain Type Chronic pain   Pain Onset More than a month ago   Pain Frequency Intermittent            [facility] PT Assessment - 01/21/17 1445      Assessment   Medical Diagnosis Gait instability   Referring Provider Willodean Rosenthal MD   Onset Date/Surgical Date 12/28/14   Hand Dominance Right   Next MD Visit unknown   Prior Therapy none     Precautions   Precautions Fall     Restrictions   Weight Bearing Restrictions No     Balance Screen   Has the patient fallen in the past 6 months Yes   How many times? 1   Has the patient had a decrease in activity level because of a fear of falling?  No   Is the patient reluctant to leave their home because of a fear of falling?  No     Home Ecologist  residence   Living Arrangements Alone   Available Help at Discharge Family   Type of [place] to enter   Entrance Stairs-Number of Steps 2   Entrance Stairs-Rails Right   [place] One level   [place] seat;Grab bars - toilet;Grab bars - tub/shower     Prior Function   Level of Independence Independent   Vocation Retired   Biomedical scientist N/A   Leisure Watching TV     Cognition   Overall Cognitive Status Within Functional Limits for tasks assessed     ROM / Strength   AROM / PROM / Strength Strength     Strength   Strength Assessment Site Hip;Ankle;Knee   Right/Left Hip Right;Left   Right Hip Flexion 4-/5   Right Hip ABduction 4-/5   Right Hip ADduction 4-/5   Left Hip Flexion 5/5   Left Hip  ABduction 5/5   Left Hip ADduction 5/5   Right/Left Knee Right;Left   Right Knee Flexion 4-/5   Right Knee Extension 4-/5   Left Knee Flexion 5/5   Left Knee Extension 5/5   Right/Left Ankle Right;Left   Right Ankle Dorsiflexion 4-/5   Right Ankle Plantar Flexion 4-/5   Left Ankle Dorsiflexion 5/5   Left Ankle Plantar Flexion 4/5     Special Tests    Special Tests Lumbar   Lumbar Tests Slump Test     Slump test   Side Right   Comment Negative, Lt: negative     Ambulation/Gait   Gait Comments Patient demonstrates forward trunk lean with walking and widened BOS. Patient demonstrates decreased gait speed and  decreased step length bilaterally.      6 minute walk test results    Aerobic Endurance Distance Walked 1075     Standardized Balance Assessment   Standardized Balance Assessment Timed Up and Go Test;Five Times Sit to Stand;10 meter walk test   Five times sit to stand comments  20.01 sec   10 Meter Walk 1.41m/s     Dynamic Gait Index   Level Surface Normal   Change in Gait Speed Normal   Gait with Horizontal Head Turns Moderate Impairment   Gait with Vertical Head Turns Mild Impairment   Gait and Pivot Turn Mild Impairment   Step Over Obstacle Normal   Step Around Obstacles Moderate Impairment   Steps Moderate Impairment   Total Score 16     Timed Up and Go Test   Normal TUG (seconds) 12       TREATMENT: Sit to stands - 2 x 10 without UE support  Added to HEP        PT Education - 01/21/17 1542    Education provided Yes   Education Details HEP: sit to stands; POC   Person(s) Educated Patient   Methods Explanation;Demonstration;Handout   Comprehension Verbalized understanding;Returned demonstration             PT Long Term Goals - 01/21/17 1627      PT LONG TERM GOAL #1   Title Patient will be independent with HEP to continue benefits of therapy after discharge.   Baseline max cues to perform exercises   Time 8   Period Weeks   Status New      PT LONG TERM GOAL #2   Title Patient will score <16sec when performing 5xSTS to indicate decreased fall risk and better be able to perform sitting to standing activities   Baseline 5xSTS: 20sec  Time 8   Period Weeks   Status New     PT LONG TERM GOAL #3   Title Patient will score over 1241ft when performing 6 min walk test indicating decreased fall risk and becoming a safer community ambulator.    Baseline 62minWT: 1032ft   Time 8   Period Weeks   Status New     PT LONG TERM GOAL #4   Title Patient will improve DGI to 24 to demonstrate decreased fall risk and improved ability to ambulate in the community safely   Baseline 16/24   Time 8   Period Weeks   Status New               Plan - 02/05/17 1608    Clinical Impression Statement Patient is an 83 yo right hand dominant female presenting with gait/balance instability and decreased LE functional strength as indicated by decreased DGI, 5xSTS, and decreased MMT scores. Patients limitations indicate higher fall risk most notably when ambulating and patient will benefit from further skilled therapy focused on improving currentl limitations to return to prior level of function.    Rehab Potential Good   Clinical Impairments Affecting Rehab Potential (+) family support, highly motivated (-) age, chronicity of condition   PT Frequency 2x / week   PT Duration 8 weeks   PT Treatment/Interventions ADLs/Self Care Home Management;Moist Heat;Ultrasound;Therapeutic exercise;Therapeutic activities;Balance training;Stair training;Gait training;Neuromuscular re-education;Patient/family education;Passive range of motion;Manual techniques   PT Next Visit Plan Progress strengthening and balance activities   PT Home Exercise Plan Sit to stands   Consulted and Agree with Plan of Care Patient      Patient will benefit from skilled therapeutic intervention in order to improve the following deficits and impairments:  Abnormal gait, Decreased  strength, Decreased endurance, Decreased activity tolerance, Decreased balance, Difficulty walking, Impaired flexibility, Decreased range of motion, Increased muscle spasms, Decreased mobility, Decreased coordination, Pain, Increased fascial restricitons  Visit Diagnosis: Other abnormalities of gait and mobility  Difficulty in walking, not elsewhere classified  Muscle weakness (generalized)      G-Codes - 05-Feb-2017 1645    Functional Assessment Tool Used DGI, 8minWT, clinical judgement   Functional Limitation Mobility: Walking and moving around   Mobility: Walking and Moving Around Current Status [id number]) At least 20 percent but less than 40 percent impaired, limited or restricted   Mobility: Walking and Moving Around Goal Status [id number]) At least 1 percent but less than 20 percent impaired, limited or restricted       Problem List Patient Active Problem List   Diagnosis Date Noted  . Prediabetes 06/08/2016  . Obesity (BMI 30.0-34.9) 06/08/2016  . Vitamin D deficiency 06/08/2016  . Post-menopausal 12/11/2015  . DD (diverticular disease) 06/19/2015  . Edema extremities 06/19/2015  . Esophageal reflux 06/19/2015  . HLD (hyperlipidemia) 06/19/2015  . Varicose veins 06/19/2015  . Recurrent UTI 02/08/2013  . Mixed incontinence 02/08/2013  . History of breast cancer, left, T1, N0, Left MRM - Dec 1996 03/18/2012  . Tension headache 05/24/2007    Blythe Stanford, PT DPT 02/05/17, 4:46 PM  [place] MAIN [facility] SERVICES [address] [place], [place], [address] Phone: [phone]   Fax:  [phone]  Name: KEMIYAH SILA MRN: [id number] Date of Birth: [birth year]

## 2017-01-21 NOTE — Telephone Encounter (Signed)
PT IS NEEDING REFILL ON HER NEXIUM. SHE IS ALMOST OUT. [place]

## 2017-01-22 NOTE — Telephone Encounter (Signed)
Patient notified prescription was sent in this morning by Dr. Ancil Boozer

## 2017-01-26 ENCOUNTER — Ambulatory Visit: Payer: Medicare Other

## 2017-01-26 DIAGNOSIS — R2689 Other abnormalities of gait and mobility: Secondary | ICD-10-CM | POA: Diagnosis not present

## 2017-01-26 DIAGNOSIS — M6281 Muscle weakness (generalized): Secondary | ICD-10-CM

## 2017-01-26 DIAGNOSIS — R262 Difficulty in walking, not elsewhere classified: Secondary | ICD-10-CM

## 2017-01-26 NOTE — Therapy (Signed)
[place] MAIN [facility] SERVICES [address] [place], [place], [address] Phone: [phone]   Fax:  [phone]  Physical Therapy Treatment  Patient Details  Name: Christina Salas MRN: [id number] Date of Birth: [birth year] Referring Provider: Willodean Rosenthal MD  Encounter Date: 01/26/2017      PT End of Session - 01/26/17 1331    Visit Number 2   Number of Visits 16   Date for PT Re-Evaluation 03/18/17   Authorization Type 2/10 G Code   PT Start Time 1300   PT Stop Time 1345   PT Time Calculation (min) 45 min   Equipment Utilized During Treatment Gait belt   Activity Tolerance Patient tolerated treatment well   Behavior During Therapy [facility] for tasks assessed/performed      Past Medical History:  Diagnosis Date  . Abnormal glucose   . Arthritis   . Breast cancer ([place])   . Cancer ([place])   . Chronic headaches   . GERD (gastroesophageal reflux disease)   . H/O melanoma excision   . History of diverticulitis   . History of recurrent UTIs   . Osteoporosis    pre-osetoporosis per patient history form 03/18/12  . Vitamin D deficiency     Past Surgical History:  Procedure Laterality Date  . ABDOMINAL HYSTERECTOMY  2001  . COLONOSCOPY  04/02/2014  . CYSTOSCOPY  11/2014  . MASTECTOMY  1996   Left  . MELANOMA EXCISION  1995   Head    There were no vitals filed for this visit.      Subjective Assessment - 01/26/17 1325    Subjective Patient reports increased soreness after the previous visit and states she's been performing HEP at home.   Pertinent History Resting angina, GERD, HAs, patient reports bilateral swelling in Lower LEs B, Patient reports R sided sciatic pain with symptoms radiaiting down R LE into her knee.   Limitations Standing;Lifting;Walking;Other (comment)  Stairs   How long can you walk comfortably? 41min   Currently in Pain? No/denies   Pain Onset More than a month ago      TREATMENT:  Seated ball  squeeze/glute squeeze - 2 x 20  with 4#  Nustep seat position at 7 - resistance level 2 - 41min Sit to stands - 2 x 10 without UE support with airex on top of seat Hip abduction with GTB - 2 x 20 in sitting Seated marches with GTB - 2 x 20 B Side stepping up and over blue airex pad - x15 Forward step ups onto blue airex on 4" step - 2 x 15 Heel/toe raises in standing - x 20 with UE support Feet together on airex pad - head turns up/down x10          PT Education - 01/26/17 1330    Education provided Yes   Education Details form/technique throughout exercise   Person(s) Educated Patient   Methods Explanation;Demonstration   Comprehension Verbalized understanding;Returned demonstration             PT Long Term Goals - 01/21/17 1627      PT LONG TERM GOAL #1   Title Patient will be independent with HEP to continue benefits of therapy after discharge.   Baseline max cues to perform exercises   Time 8   Period Weeks   Status New     PT LONG TERM GOAL #2   Title Patient will score <16sec when performing 5xSTS to indicate decreased fall risk and  better be able to perform sitting to standing activities   Baseline 5xSTS: 20sec   Time 8   Period Weeks   Status New     PT LONG TERM GOAL #3   Title Patient will score over 1262ft when performing 6 min walk test indicating decreased fall risk and becoming a safer community ambulator.    Baseline 10minWT: 1039ft   Time 8   Period Weeks   Status New     PT LONG TERM GOAL #4   Title Patient will improve DGI to 24 to demonstrate decreased fall risk and improved ability to ambulate in the community safely   Baseline 16/24   Time 8   Period Weeks   Status New               Plan - 01/26/17 1332    Clinical Impression Statement Focused on performing hip stabilization and balancing activities to improve muscular endurance/strength. Performed increase sitting exercises as patient reports increased muscular soreness after  the previous visit. Patient requires increased sitting rest breaks throughout treatment session indicating decreased endurance and patient will beenfit from further skilled therapy to return to prior level of function.    Rehab Potential Good   Clinical Impairments Affecting Rehab Potential (+) family support, highly motivated (-) age, chronicity of condition   PT Frequency 2x / week   PT Duration 8 weeks   PT Treatment/Interventions ADLs/Self Care Home Management;Moist Heat;Ultrasound;Therapeutic exercise;Therapeutic activities;Balance training;Stair training;Gait training;Neuromuscular re-education;Patient/family education;Passive range of motion;Manual techniques   PT Next Visit Plan Progress strengthening and balance activities   PT Home Exercise Plan Sit to stands   Consulted and Agree with Plan of Care Patient      Patient will benefit from skilled therapeutic intervention in order to improve the following deficits and impairments:  Abnormal gait, Decreased strength, Decreased endurance, Decreased activity tolerance, Decreased balance, Difficulty walking, Impaired flexibility, Decreased range of motion, Increased muscle spasms, Decreased mobility, Decreased coordination, Pain, Increased fascial restricitons  Visit Diagnosis: Other abnormalities of gait and mobility  Difficulty in walking, not elsewhere classified  Muscle weakness (generalized)     Problem List Patient Active Problem List   Diagnosis Date Noted  . Prediabetes 06/08/2016  . Obesity (BMI 30.0-34.9) 06/08/2016  . Vitamin D deficiency 06/08/2016  . Post-menopausal 12/11/2015  . DD (diverticular disease) 06/19/2015  . Edema extremities 06/19/2015  . Esophageal reflux 06/19/2015  . HLD (hyperlipidemia) 06/19/2015  . Varicose veins 06/19/2015  . Recurrent UTI 02/08/2013  . Mixed incontinence 02/08/2013  . History of breast cancer, left, T1, N0, Left MRM - Dec 1996 03/18/2012  . Tension headache 05/24/2007     Blythe Stanford, PT DPT 01/26/2017, 1:42 PM  [place] MAIN [facility] SERVICES [address] [place], [place], [address] Phone: [phone]   Fax:  [phone]  Name: JULIEANNE KASEY MRN: [id number] Date of Birth: 02-17-1933

## 2017-01-28 ENCOUNTER — Ambulatory Visit: Payer: Medicare Other | Attending: Family Medicine

## 2017-01-28 DIAGNOSIS — R2689 Other abnormalities of gait and mobility: Secondary | ICD-10-CM | POA: Diagnosis not present

## 2017-01-28 DIAGNOSIS — M6281 Muscle weakness (generalized): Secondary | ICD-10-CM | POA: Insufficient documentation

## 2017-01-28 DIAGNOSIS — R262 Difficulty in walking, not elsewhere classified: Secondary | ICD-10-CM | POA: Diagnosis present

## 2017-01-28 NOTE — Therapy (Signed)
[place] MAIN [facility] SERVICES [address] [place], [place], [address] Phone: [phone]   Fax:  [phone]  Physical Therapy Treatment  Patient Details  Name: Christina Salas MRN: [id number] Date of Birth: [birth year] Referring Provider: Willodean Rosenthal MD  Encounter Date: 01/28/2017      PT End of Session - 01/28/17 1612    Visit Number 3   Number of Visits 16   Date for PT Re-Evaluation 03/18/17   Authorization Type 3/10 G Code   PT Start Time 1600   PT Stop Time 1645   PT Time Calculation (min) 45 min   Equipment Utilized During Treatment Gait belt   Activity Tolerance Patient tolerated treatment well   Behavior During Therapy [facility] for tasks assessed/performed      Past Medical History:  Diagnosis Date  . Abnormal glucose   . Arthritis   . Breast cancer ([place])   . Cancer ([place])   . Chronic headaches   . GERD (gastroesophageal reflux disease)   . H/O melanoma excision   . History of diverticulitis   . History of recurrent UTIs   . Osteoporosis    pre-osetoporosis per patient history form 03/18/12  . Vitamin D deficiency     Past Surgical History:  Procedure Laterality Date  . ABDOMINAL HYSTERECTOMY  2001  . COLONOSCOPY  04/02/2014  . CYSTOSCOPY  11/2014  . MASTECTOMY  1996   Left  . MELANOMA EXCISION  1995   Head    There were no vitals filed for this visit.      Subjective Assessment - 01/28/17 1608    Subjective Patient reports no current muscular soreness. Patient reports some pain this morning in her knees.    Pertinent History Resting angina, GERD, HAs, patient reports bilateral swelling in Lower LEs B, Patient reports R sided sciatic pain with symptoms radiaiting down R LE into her knee.   Limitations Standing;Lifting;Walking;Other (comment)  Stairs   How long can you walk comfortably? 8min   Currently in Pain? No/denies   Pain Onset More than a month ago       TREATMENT:  Seated ball  squeeze/glute squeeze - 2 x 20 with 4#  Nustep seat position at 7 - resistance level 2 - 20min Sit to stands - 2 x 12 without UE support with airex on top of seat Side stepping over airex beam - down and back x 7 Heel/toe raises in standing in airex- 2 x 20 with UE support Marches with balance stones - x 20  Step ups with and without UEs - x 20 (with each type of support) 2 flight of steps with Unilateral UE support - x 1  Side stepping at MATRIX - x 2 laterally each direction      PT Education - 01/28/17 1611    Education provided Yes   Education Details form/technique throughout exercise    Person(s) Educated Patient   Methods Explanation;Demonstration   Comprehension Verbalized understanding;Returned demonstration             PT Long Term Goals - 01/21/17 1627      PT LONG TERM GOAL #1   Title Patient will be independent with HEP to continue benefits of therapy after discharge.   Baseline max cues to perform exercises   Time 8   Period Weeks   Status New     PT LONG TERM GOAL #2   Title Patient will score <16sec when performing 5xSTS to indicate decreased fall  risk and better be able to perform sitting to standing activities   Baseline 5xSTS: 20sec   Time 8   Period Weeks   Status New     PT LONG TERM GOAL #3   Title Patient will score over 1240ft when performing 6 min walk test indicating decreased fall risk and becoming a safer community ambulator.    Baseline 67minWT: 1032ft   Time 8   Period Weeks   Status New     PT LONG TERM GOAL #4   Title Patient will improve DGI to 24 to demonstrate decreased fall risk and improved ability to ambulate in the community safely   Baseline 16/24   Time 8   Period Weeks   Status New               Plan - 01/28/17 1645    Clinical Impression Statement Focused on performing strengthening and endurance exercises to return to prior level of function and patient demonstrates decreased muscular endurance as indicated by  requiring sitting rest breaks throughout session. Patient also requires frequent UE support for balancing and patient will benefit from further skilled therapy to return to prior level of function.    Rehab Potential Good   Clinical Impairments Affecting Rehab Potential (+) family support, highly motivated (-) age, chronicity of condition   PT Frequency 2x / week   PT Duration 8 weeks   PT Treatment/Interventions ADLs/Self Care Home Management;Moist Heat;Ultrasound;Therapeutic exercise;Therapeutic activities;Balance training;Stair training;Gait training;Neuromuscular re-education;Patient/family education;Passive range of motion;Manual techniques   PT Next Visit Plan Progress strengthening and balance activities   PT Home Exercise Plan Sit to stands   Consulted and Agree with Plan of Care Patient      Patient will benefit from skilled therapeutic intervention in order to improve the following deficits and impairments:  Abnormal gait, Decreased strength, Decreased endurance, Decreased activity tolerance, Decreased balance, Difficulty walking, Impaired flexibility, Decreased range of motion, Increased muscle spasms, Decreased mobility, Decreased coordination, Pain, Increased fascial restricitons  Visit Diagnosis: Other abnormalities of gait and mobility  Difficulty in walking, not elsewhere classified  Muscle weakness (generalized)     Problem List Patient Active Problem List   Diagnosis Date Noted  . Prediabetes 06/08/2016  . Obesity (BMI 30.0-34.9) 06/08/2016  . Vitamin D deficiency 06/08/2016  . Post-menopausal 12/11/2015  . DD (diverticular disease) 06/19/2015  . Edema extremities 06/19/2015  . Esophageal reflux 06/19/2015  . HLD (hyperlipidemia) 06/19/2015  . Varicose veins 06/19/2015  . Recurrent UTI 02/08/2013  . Mixed incontinence 02/08/2013  . History of breast cancer, left, T1, N0, Left MRM - Dec 1996 03/18/2012  . Tension headache 05/24/2007    Blythe Stanford, PT  DPT 01/28/2017, 5:48 PM  [place] MAIN [facility] SERVICES [address] [place], [place], [address] Phone: [phone]   Fax:  [phone]  Name: Christina Salas MRN: [id number] Date of Birth: Nov 03, 1933

## 2017-02-02 ENCOUNTER — Ambulatory Visit: Payer: Medicare Other

## 2017-02-02 DIAGNOSIS — R2689 Other abnormalities of gait and mobility: Secondary | ICD-10-CM | POA: Diagnosis not present

## 2017-02-02 DIAGNOSIS — M6281 Muscle weakness (generalized): Secondary | ICD-10-CM

## 2017-02-02 DIAGNOSIS — R262 Difficulty in walking, not elsewhere classified: Secondary | ICD-10-CM

## 2017-02-02 NOTE — Therapy (Signed)
[place] MAIN [facility] SERVICES [address] [place], [place], [address] Phone: [phone]   Fax:  [phone]  Physical Therapy Treatment  Patient Details  Name: Christina Salas MRN: [id number] Date of Birth: 1933-12-04 Referring Provider: Willodean Rosenthal MD  Encounter Date: 02/02/2017      PT End of Session - 02/02/17 1150    Visit Number 4   Number of Visits 16   Date for PT Re-Evaluation 03/18/17   Authorization Type 4/10 G Code   PT Start Time 1100   PT Stop Time 1145   PT Time Calculation (min) 45 min   Equipment Utilized During Treatment Gait belt   Activity Tolerance Patient tolerated treatment well   Behavior During Therapy [facility] for tasks assessed/performed      Past Medical History:  Diagnosis Date  . Abnormal glucose   . Arthritis   . Breast cancer ([place])   . Cancer ([place])   . Chronic headaches   . GERD (gastroesophageal reflux disease)   . H/O melanoma excision   . History of diverticulitis   . History of recurrent UTIs   . Osteoporosis    pre-osetoporosis per patient history form 03/18/12  . Vitamin D deficiency     Past Surgical History:  Procedure Laterality Date  . ABDOMINAL HYSTERECTOMY  2001  . COLONOSCOPY  04/02/2014  . CYSTOSCOPY  11/2014  . MASTECTOMY  1996   Left  . MELANOMA EXCISION  1995   Head    There were no vitals filed for this visit.      Subjective Assessment - 02/02/17 1109    Subjective Patient reports no muscular soreness currently. Patient reports she has difficulty with going down the stairs   Pertinent History Resting angina, GERD, HAs, patient reports bilateral swelling in Lower LEs B, Patient reports R sided sciatic pain with symptoms radiaiting down R LE into her knee.   Limitations Standing;Lifting;Walking;Other (comment)  Stairs   How long can you walk comfortably? 65min   Currently in Pain? No/denies   Pain Onset More than a month ago        TREATMENT:  Seated ball  squeeze/glute squeeze - 2 x 20 with 4#  Nustep seat position at 7 - resistance level 5 - 17min Sit to stands - 2 x 15 without UE support  Side stepping over airex on 4" step - up and over x15 Tandem stance balance on airex pad - 45sec x 2 B Tandem ambulation in  bars - x 5 down and back with intermittent UE support Single leg stance with intermittent step down for balance - 2 x 30sec B Heel/toe raises in standing off of half blue foam-  x 20 with UE support 2 flight of steps with Unilateral UE support - x 1  Monster walks with GTB - 76ft x 2      PT Education - 02/02/17 1123    Education provided Yes   Education Details HEP: single leg stance, Tandem ambulation    Person(s) Educated Patient   Methods Explanation;Demonstration   Comprehension Returned demonstration;Verbalized understanding             PT Long Term Goals - 01/21/17 1627      PT LONG TERM GOAL #1   Title Patient will be independent with HEP to continue benefits of therapy after discharge.   Baseline max cues to perform exercises   Time 8   Period Weeks   Status New  PT LONG TERM GOAL #2   Title Patient will score <16sec when performing 5xSTS to indicate decreased fall risk and better be able to perform sitting to standing activities   Baseline 5xSTS: 20sec   Time 8   Period Weeks   Status New     PT LONG TERM GOAL #3   Title Patient will score over 1232ft when performing 6 min walk test indicating decreased fall risk and becoming a safer community ambulator.    Baseline 73minWT: 1073ft   Time 8   Period Weeks   Status New     PT LONG TERM GOAL #4   Title Patient will improve DGI to 24 to demonstrate decreased fall risk and improved ability to ambulate in the community safely   Baseline 16/24   Time 8   Period Weeks   Status New               Plan - 02/02/17 1201    Clinical Impression Statement Patient tolerates advancement exercise resistance and difficulty today indicating  functional carryover between visits. Patient demonstrates requirement of UE support for balancing activities indicating decreased dynamic balance and pt will benefit from further skilled therapy to return to prior level of function.    Rehab Potential Good   Clinical Impairments Affecting Rehab Potential (+) family support, highly motivated (-) age, chronicity of condition   PT Frequency 2x / week   PT Duration 8 weeks   PT Treatment/Interventions ADLs/Self Care Home Management;Moist Heat;Ultrasound;Therapeutic exercise;Therapeutic activities;Balance training;Stair training;Gait training;Neuromuscular re-education;Patient/family education;Passive range of motion;Manual techniques   PT Next Visit Plan Progress strengthening and balance activities   PT Home Exercise Plan Sit to stands   Consulted and Agree with Plan of Care Patient      Patient will benefit from skilled therapeutic intervention in order to improve the following deficits and impairments:  Abnormal gait, Decreased strength, Decreased endurance, Decreased activity tolerance, Decreased balance, Difficulty walking, Impaired flexibility, Decreased range of motion, Increased muscle spasms, Decreased mobility, Decreased coordination, Pain, Increased fascial restricitons  Visit Diagnosis: Other abnormalities of gait and mobility  Difficulty in walking, not elsewhere classified  Muscle weakness (generalized)     Problem List Patient Active Problem List   Diagnosis Date Noted  . Prediabetes 06/08/2016  . Obesity (BMI 30.0-34.9) 06/08/2016  . Vitamin D deficiency 06/08/2016  . Post-menopausal 12/11/2015  . DD (diverticular disease) 06/19/2015  . Edema extremities 06/19/2015  . Esophageal reflux 06/19/2015  . HLD (hyperlipidemia) 06/19/2015  . Varicose veins 06/19/2015  . Recurrent UTI 02/08/2013  . Mixed incontinence 02/08/2013  . History of breast cancer, left, T1, N0, Left MRM - Dec 1996 03/18/2012  . Tension headache  05/24/2007    Blythe Stanford, PT DPT 02/02/2017, 12:06 PM  [place] MAIN [facility] SERVICES [address] [place], [place], [address] Phone: [phone]   Fax:  [phone]  Name: GURNOOR LAHN MRN: [id number] Date of Birth: Oct 01, 1933

## 2017-02-04 ENCOUNTER — Ambulatory Visit: Payer: Medicare Other

## 2017-02-04 DIAGNOSIS — M6281 Muscle weakness (generalized): Secondary | ICD-10-CM

## 2017-02-04 DIAGNOSIS — R262 Difficulty in walking, not elsewhere classified: Secondary | ICD-10-CM

## 2017-02-04 DIAGNOSIS — R2689 Other abnormalities of gait and mobility: Secondary | ICD-10-CM

## 2017-02-04 NOTE — Therapy (Signed)
[place] MAIN [facility] SERVICES [address] [place], [place], [address] Phone: [phone]   Fax:  [phone]  Physical Therapy Treatment  Patient Details  Name: Christina Salas MRN: [id number] Date of Birth: 03/20/1933 Referring Provider: Willodean Rosenthal MD  Encounter Date: 02/04/2017      PT End of Session - 02/04/17 1121    Visit Number 5   Number of Visits 16   Date for PT Re-Evaluation 03/18/17   Authorization Type 5/10 G Code   PT Start Time 1115   PT Stop Time 1200   PT Time Calculation (min) 45 min   Equipment Utilized During Treatment Gait belt   Activity Tolerance Patient tolerated treatment well   Behavior During Therapy [facility] for tasks assessed/performed      Past Medical History:  Diagnosis Date  . Abnormal glucose   . Arthritis   . Breast cancer ([place])   . Cancer ([place])   . Chronic headaches   . GERD (gastroesophageal reflux disease)   . H/O melanoma excision   . History of diverticulitis   . History of recurrent UTIs   . Osteoporosis    pre-osetoporosis per patient history form 03/18/12  . Vitamin D deficiency     Past Surgical History:  Procedure Laterality Date  . ABDOMINAL HYSTERECTOMY  2001  . COLONOSCOPY  04/02/2014  . CYSTOSCOPY  11/2014  . MASTECTOMY  1996   Left  . MELANOMA EXCISION  1995   Head    There were no vitals filed for this visit.      Subjective Assessment - 02/04/17 1119    Subjective Patient reports no soreness currently. Patient reports increased fatigue yesterday but performed HEP.    Pertinent History Resting angina, GERD, HAs, patient reports bilateral swelling in Lower LEs B, Patient reports R sided sciatic pain with symptoms radiaiting down R LE into her knee.   Limitations Standing;Lifting;Walking;Other (comment)  Stairs   How long can you walk comfortably? 61min   Currently in Pain? No/denies   Pain Onset More than a month ago      TREATMENT:  Seated ball  squeeze/glute squeeze - 2 x 20 with 4#  Nustep seat position at 7 - resistance level 5 - 53min Sit to stands - 2 x 15 without UE support  Leg Press with B LE at quantum for increasing strength - x10 105# Single leg stance on airex pad with intermittent step down for balance - 2 x 30sec B Cone taps from airex pad - x 15  2 flight of steps with Unilateral UE support - x 2 Heel/toe raises on airex pad-  x 20 with UE support Heel taps off of 2 airex pads - x 10       PT Education - 02/04/17 1121    Education provided Yes   Education Details form/technique with exercise performance   Person(s) Educated Patient   Methods Explanation;Demonstration   Comprehension Verbalized understanding;Returned demonstration             PT Long Term Goals - 01/21/17 1627      PT LONG TERM GOAL #1   Title Patient will be independent with HEP to continue benefits of therapy after discharge.   Baseline max cues to perform exercises   Time 8   Period Weeks   Status New     PT LONG TERM GOAL #2   Title Patient will score <16sec when performing 5xSTS to indicate decreased fall risk and better  be able to perform sitting to standing activities   Baseline 5xSTS: 20sec   Time 8   Period Weeks   Status New     PT LONG TERM GOAL #3   Title Patient will score over 124ft when performing 6 min walk test indicating decreased fall risk and becoming a safer community ambulator.    Baseline 49minWT: 1067ft   Time 8   Period Weeks   Status New     PT LONG TERM GOAL #4   Title Patient will improve DGI to 24 to demonstrate decreased fall risk and improved ability to ambulate in the community safely   Baseline 16/24   Time 8   Period Weeks   Status New               Plan - 02/04/17 1244    Clinical Impression Statement Advanced LE strengthening exercises and patient tolerated advancement with minimal fatigue noted post performing exercises. Patient demonstrates ability to perform 4 flights of  stairs today indicating functional carryover between visits. Patient will benefit from further skilled therapy to return to prior level of function.    Rehab Potential Good   Clinical Impairments Affecting Rehab Potential (+) family support, highly motivated (-) age, chronicity of condition   PT Frequency 2x / week   PT Duration 8 weeks   PT Treatment/Interventions ADLs/Self Care Home Management;Moist Heat;Ultrasound;Therapeutic exercise;Therapeutic activities;Balance training;Stair training;Gait training;Neuromuscular re-education;Patient/family education;Passive range of motion;Manual techniques   PT Next Visit Plan Progress strengthening and balance activities   PT Home Exercise Plan Sit to stands   Consulted and Agree with Plan of Care Patient      Patient will benefit from skilled therapeutic intervention in order to improve the following deficits and impairments:  Abnormal gait, Decreased strength, Decreased endurance, Decreased activity tolerance, Decreased balance, Difficulty walking, Impaired flexibility, Decreased range of motion, Increased muscle spasms, Decreased mobility, Decreased coordination, Pain, Increased fascial restricitons  Visit Diagnosis: Other abnormalities of gait and mobility  Difficulty in walking, not elsewhere classified  Muscle weakness (generalized)     Problem List Patient Active Problem List   Diagnosis Date Noted  . Prediabetes 06/08/2016  . Obesity (BMI 30.0-34.9) 06/08/2016  . Vitamin D deficiency 06/08/2016  . Post-menopausal 12/11/2015  . DD (diverticular disease) 06/19/2015  . Edema extremities 06/19/2015  . Esophageal reflux 06/19/2015  . HLD (hyperlipidemia) 06/19/2015  . Varicose veins 06/19/2015  . Recurrent UTI 02/08/2013  . Mixed incontinence 02/08/2013  . History of breast cancer, left, T1, N0, Left MRM - Dec 1996 03/18/2012  . Tension headache 05/24/2007    Blythe Stanford, PT DPT 02/04/2017, 12:50 PM  [place] MAIN [facility] SERVICES [address] [place], [place], [address] Phone: [phone]   Fax:  [phone]  Name: Christina Salas MRN: [id number] Date of Birth: November 18, 1933

## 2017-02-08 ENCOUNTER — Ambulatory Visit: Payer: Medicare Other | Admitting: Physical Therapy

## 2017-02-08 ENCOUNTER — Encounter: Payer: Self-pay | Admitting: Physical Therapy

## 2017-02-08 DIAGNOSIS — M6281 Muscle weakness (generalized): Secondary | ICD-10-CM

## 2017-02-08 DIAGNOSIS — R262 Difficulty in walking, not elsewhere classified: Secondary | ICD-10-CM

## 2017-02-08 DIAGNOSIS — R2689 Other abnormalities of gait and mobility: Secondary | ICD-10-CM

## 2017-02-08 NOTE — Therapy (Signed)
[place] MAIN [facility] SERVICES [address] [place], [place], [address] Phone: [phone]   Fax:  [phone]  Physical Therapy Treatment  Patient Details  Name: Christina Salas MRN: [id number] Date of Birth: 1933-02-17 Referring Provider: Willodean Rosenthal MD  Encounter Date: 02/08/2017      PT End of Session - 02/08/17 1152    Visit Number 6   Number of Visits 16   Date for PT Re-Evaluation 03/18/17   Authorization Type 6/10 G Code   PT Start Time [id number]   PT Stop Time 1225   PT Time Calculation (min) 40 min   Equipment Utilized During Treatment Gait belt   Activity Tolerance Patient tolerated treatment well   Behavior During Therapy [facility] for tasks assessed/performed      Past Medical History:  Diagnosis Date  . Abnormal glucose   . Arthritis   . Breast cancer ([place])   . Cancer ([place])   . Chronic headaches   . GERD (gastroesophageal reflux disease)   . H/O melanoma excision   . History of diverticulitis   . History of recurrent UTIs   . Osteoporosis    pre-osetoporosis per patient history form 03/18/12  . Vitamin D deficiency     Past Surgical History:  Procedure Laterality Date  . ABDOMINAL HYSTERECTOMY  2001  . COLONOSCOPY  04/02/2014  . CYSTOSCOPY  11/2014  . MASTECTOMY  1996   Left  . MELANOMA EXCISION  1995   Head    There were no vitals filed for this visit.      Subjective Assessment - 02/08/17 1150    Subjective Patient reports no soreness currently. Patient reports increased fatigue yesterday but did not performe her HEP. She has edema in her legs and it was bothering her.    Pertinent History Resting angina, GERD, HAs, patient reports bilateral swelling in Lower LEs B, Patient reports R sided sciatic pain with symptoms radiaiting down R LE into her knee.   Limitations Standing;Lifting;Walking;Other (comment)  Stairs   How long can you walk comfortably? 52min   Currently in Pain? Yes   Pain Score 0-No  pain   Pain Onset More than a month ago   Multiple Pain Sites No      Therapeutic exercise Leg press 75 lbs x 20 x 3 Nu-step x 5 minutes Standing hip abd/hip ext/hip flex with YTB x 20 x 2  Heel raises x 20 x 2 Monster walks x 10 feet x 5 Sit to stands without UE x 10 x 2 hooklying marching with 2 . 5 lbs x 20 x 3 Hooklying ER/abd x 20 x 2 SAQ with 2. 5 lbs and 5 sec hold No reports of pain following exercises. Patient needs cues to perform exercises slowly and correctly.                             PT Education - 02/08/17 1151    Education provided Yes   Education Details safety with balance and mobility   Person(s) Educated Patient   Methods Explanation   Comprehension Verbalized understanding             PT Long Term Goals - 01/21/17 1627      PT LONG TERM GOAL #1   Title Patient will be independent with HEP to continue benefits of therapy after discharge.   Baseline max cues to perform exercises   Time 8   Period  Weeks   Status New     PT LONG TERM GOAL #2   Title Patient will score <16sec when performing 5xSTS to indicate decreased fall risk and better be able to perform sitting to standing activities   Baseline 5xSTS: 20sec   Time 8   Period Weeks   Status New     PT LONG TERM GOAL #3   Title Patient will score over 1260ft when performing 6 min walk test indicating decreased fall risk and becoming a safer community ambulator.    Baseline 22minWT: 1018ft   Time 8   Period Weeks   Status New     PT LONG TERM GOAL #4   Title Patient will improve DGI to 24 to demonstrate decreased fall risk and improved ability to ambulate in the community safely   Baseline 16/24   Time 8   Period Weeks   Status New               Plan - 02/08/17 1152    Clinical Impression Statement Focused on improving LE and core strength and patient needed cues for performing exericses correctly and required use of UE support to perform exercises.   Patient will benefit from further skilled therapy to return to prior level of function.   Rehab Potential Good   Clinical Impairments Affecting Rehab Potential (+) family support, highly motivated (-) age, chronicity of condition   PT Frequency 2x / week   PT Duration 8 weeks   PT Treatment/Interventions ADLs/Self Care Home Management;Moist Heat;Ultrasound;Therapeutic exercise;Therapeutic activities;Balance training;Stair training;Gait training;Neuromuscular re-education;Patient/family education;Passive range of motion;Manual techniques   PT Next Visit Plan Progress strengthening and balance activities   PT Home Exercise Plan Sit to stands   Consulted and Agree with Plan of Care Patient      Patient will benefit from skilled therapeutic intervention in order to improve the following deficits and impairments:  Abnormal gait, Decreased strength, Decreased endurance, Decreased activity tolerance, Decreased balance, Difficulty walking, Impaired flexibility, Decreased range of motion, Increased muscle spasms, Decreased mobility, Decreased coordination, Pain, Increased fascial restricitons  Visit Diagnosis: Other abnormalities of gait and mobility  Difficulty in walking, not elsewhere classified  Muscle weakness (generalized)     Problem List Patient Active Problem List   Diagnosis Date Noted  . Prediabetes 06/08/2016  . Obesity (BMI 30.0-34.9) 06/08/2016  . Vitamin D deficiency 06/08/2016  . Post-menopausal 12/11/2015  . DD (diverticular disease) 06/19/2015  . Edema extremities 06/19/2015  . Esophageal reflux 06/19/2015  . HLD (hyperlipidemia) 06/19/2015  . Varicose veins 06/19/2015  . Recurrent UTI 02/08/2013  . Mixed incontinence 02/08/2013  . History of breast cancer, left, T1, N0, Left MRM - Dec 1996 03/18/2012  . Tension headache 05/24/2007  Alanson Puls, PT, DPT  [place] 02/08/2017, 12:25 PM  [place] MAIN [facility]  SERVICES [address] [place], [place], [address] Phone: [phone]   Fax:  [phone]  Name: Christina Salas MRN: [id number] Date of Birth: September 02, 1933

## 2017-02-10 ENCOUNTER — Ambulatory Visit: Payer: Medicare Other

## 2017-02-10 DIAGNOSIS — R2689 Other abnormalities of gait and mobility: Secondary | ICD-10-CM

## 2017-02-10 DIAGNOSIS — M6281 Muscle weakness (generalized): Secondary | ICD-10-CM

## 2017-02-10 DIAGNOSIS — R262 Difficulty in walking, not elsewhere classified: Secondary | ICD-10-CM

## 2017-02-10 NOTE — Therapy (Signed)
[place] MAIN [facility] SERVICES [address] [place], [place], [address] Phone: [phone]   Fax:  [phone]  Physical Therapy Treatment  Patient Details  Name: Christina Salas MRN: [id number] Date of Birth: 1933/11/23 Referring Provider: Willodean Rosenthal MD  Encounter Date: 02/10/2017      PT End of Session - 02/10/17 1253    Visit Number 7   Number of Visits 16   Date for PT Re-Evaluation 03/18/17   Authorization Type 7/10 G Code   PT Start Time 1100   PT Stop Time 1145   PT Time Calculation (min) 45 min   Equipment Utilized During Treatment Gait belt   Activity Tolerance Patient tolerated treatment well   Behavior During Therapy [facility] for tasks assessed/performed      Past Medical History:  Diagnosis Date  . Abnormal glucose   . Arthritis   . Breast cancer ([place])   . Cancer ([place])   . Chronic headaches   . GERD (gastroesophageal reflux disease)   . H/O melanoma excision   . History of diverticulitis   . History of recurrent UTIs   . Osteoporosis    pre-osetoporosis per patient history form 03/18/12  . Vitamin D deficiency     Past Surgical History:  Procedure Laterality Date  . ABDOMINAL HYSTERECTOMY  2001  . COLONOSCOPY  04/02/2014  . CYSTOSCOPY  11/2014  . MASTECTOMY  1996   Left  . MELANOMA EXCISION  1995   Head    There were no vitals filed for this visit.      Subjective Assessment - 02/10/17 1251    Subjective Patient reports no major changes since the previous visit. States she continues to have difficulty ascending and descending the stairs.   Pertinent History Resting angina, GERD, HAs, patient reports bilateral swelling in Lower LEs B, Patient reports R sided sciatic pain with symptoms radiaiting down R LE into her knee.   Limitations Standing;Lifting;Walking;Other (comment)  Stairs   How long can you walk comfortably? 47min   Currently in Pain? No/denies   Pain Onset More than a month ago       TREATMENT:  Nustep seat position at 7 - resistance level 6 - 36min; level 5 - 2 min Sit to stands - 2 x 17 without UE support  Lateral walkouts in standing at MATRIX - 7.5# x3 in each direction; forward walkouts x 3 7.5# with cueing on speed and safety throughout movement Heel taps off of 2 airex pads - 2 x 10 B Heel/toe raises on airex pad-  x 20 with UE support 2 flights of steps with Unilateral UE support - x 2 Tandem ambulation - 2 x 15ft Side stepping with ball toss for distance - 2 x 40 Forward ambulation with ball toss and head follows - 2 x 43ft      PT Education - 02/10/17 1252    Education provided Yes   Education Details HEP: Stairs; education of performance of HEP    Person(s) Educated Patient   Methods Explanation;Demonstration   Comprehension Verbalized understanding;Returned demonstration             PT Long Term Goals - 01/21/17 1627      PT LONG TERM GOAL #1   Title Patient will be independent with HEP to continue benefits of therapy after discharge.   Baseline max cues to perform exercises   Time 8   Period Weeks   Status New     PT LONG  TERM GOAL #2   Title Patient will score <16sec when performing 5xSTS to indicate decreased fall risk and better be able to perform sitting to standing activities   Baseline 5xSTS: 20sec   Time 8   Period Weeks   Status New     PT LONG TERM GOAL #3   Title Patient will score over 127ft when performing 6 min walk test indicating decreased fall risk and becoming a safer community ambulator.    Baseline 9minWT: 1050ft   Time 8   Period Weeks   Status New     PT LONG TERM GOAL #4   Title Patient will improve DGI to 24 to demonstrate decreased fall risk and improved ability to ambulate in the community safely   Baseline 16/24   Time 8   Period Weeks   Status New               Plan - 02/10/17 1254    Clinical Impression Statement Patient demonstrates improved stairs performance with improved speed of  performance compared to previous visits. Patient demonstrated decreased quad strength with performing heel taps in standing and patient will benefit from further skilled therapy to return to prior level of function.    Rehab Potential Good   Clinical Impairments Affecting Rehab Potential (+) family support, highly motivated (-) age, chronicity of condition   PT Frequency 2x / week   PT Duration 8 weeks   PT Treatment/Interventions ADLs/Self Care Home Management;Moist Heat;Ultrasound;Therapeutic exercise;Therapeutic activities;Balance training;Stair training;Gait training;Neuromuscular re-education;Patient/family education;Passive range of motion;Manual techniques   PT Next Visit Plan Progress strengthening and balance activities   PT Home Exercise Plan Sit to stands   Consulted and Agree with Plan of Care Patient      Patient will benefit from skilled therapeutic intervention in order to improve the following deficits and impairments:  Abnormal gait, Decreased strength, Decreased endurance, Decreased activity tolerance, Decreased balance, Difficulty walking, Impaired flexibility, Decreased range of motion, Increased muscle spasms, Decreased mobility, Decreased coordination, Pain, Increased fascial restricitons  Visit Diagnosis: Other abnormalities of gait and mobility  Difficulty in walking, not elsewhere classified  Muscle weakness (generalized)     Problem List Patient Active Problem List   Diagnosis Date Noted  . Prediabetes 06/08/2016  . Obesity (BMI 30.0-34.9) 06/08/2016  . Vitamin D deficiency 06/08/2016  . Post-menopausal 12/11/2015  . DD (diverticular disease) 06/19/2015  . Edema extremities 06/19/2015  . Esophageal reflux 06/19/2015  . HLD (hyperlipidemia) 06/19/2015  . Varicose veins 06/19/2015  . Recurrent UTI 02/08/2013  . Mixed incontinence 02/08/2013  . History of breast cancer, left, T1, N0, Left MRM - Dec 1996 03/18/2012  . Tension headache 05/24/2007     Blythe Stanford, PT DPT 02/10/2017, 12:59 PM  [place] MAIN [facility] SERVICES [address] [place], [place], [address] Phone: [phone]   Fax:  [phone]  Name: Christina Salas MRN: [id number] Date of Birth: 1933-01-11

## 2017-02-15 ENCOUNTER — Ambulatory Visit: Payer: Medicare Other

## 2017-02-15 DIAGNOSIS — R2689 Other abnormalities of gait and mobility: Secondary | ICD-10-CM

## 2017-02-15 DIAGNOSIS — M6281 Muscle weakness (generalized): Secondary | ICD-10-CM

## 2017-02-15 DIAGNOSIS — R262 Difficulty in walking, not elsewhere classified: Secondary | ICD-10-CM

## 2017-02-15 NOTE — Therapy (Signed)
[place] MAIN [facility] SERVICES [address] [place], [place], [address] Phone: [phone]   Fax:  [phone]  Physical Therapy Treatment  Patient Details  Name: Christina Salas MRN: [id number] Date of Birth: October 25, 1933 Referring Provider: Willodean Rosenthal MD  Encounter Date: 02/15/2017      PT End of Session - 02/15/17 1306    Visit Number 8   Number of Visits 16   Date for PT Re-Evaluation 03/18/17   Authorization Type 8/10 G Code   PT Start Time [id number]   PT Stop Time 1146   PT Time Calculation (min) 44 min   Equipment Utilized During Treatment Gait belt   Activity Tolerance Patient tolerated treatment well   Behavior During Therapy [facility] for tasks assessed/performed      Past Medical History:  Diagnosis Date  . Abnormal glucose   . Arthritis   . Breast cancer ([place])   . Cancer ([place])   . Chronic headaches   . GERD (gastroesophageal reflux disease)   . H/O melanoma excision   . History of diverticulitis   . History of recurrent UTIs   . Osteoporosis    pre-osetoporosis per patient history form 03/18/12  . Vitamin D deficiency     Past Surgical History:  Procedure Laterality Date  . ABDOMINAL HYSTERECTOMY  2001  . COLONOSCOPY  04/02/2014  . CYSTOSCOPY  11/2014  . MASTECTOMY  1996   Left  . MELANOMA EXCISION  1995   Head    There were no vitals filed for this visit.      Subjective Assessment - 02/15/17 1305    Subjective Patient reports she was able to use the steps with minimal support. Patient reports she was able to step down with her right foot. when descending her stairs.    Pertinent History Resting angina, GERD, HAs, patient reports bilateral swelling in Lower LEs B, Patient reports R sided sciatic pain with symptoms radiaiting down R LE into her knee.   Limitations Standing;Lifting;Walking;Other (comment)  Stairs   How long can you walk comfortably? 20min   Currently in Pain? No/denies   Pain Onset More  than a month ago       TREATMENT:  Nustep seat position at 7 - resistance level 6 - 69min; level 5 - 1 min with cueing on speed of performance  Heel raises off of blue half foam - 2 x 20 Feet shoulder width apart on half foam (round side down) - 2 x 1 min   Heel taps off of 2 airex pads - 2 x 10 B Step ups onto 8" steps - 2 x10 B  2 flights of steps with Unilateral UE support - x 2 Step downs with focus on eccentric control of L LE - x 15       PT Education - 02/15/17 1306    Education provided Yes   Education Details Form/technique with stairs   Person(s) Educated Patient   Methods Explanation;Demonstration   Comprehension Verbalized understanding;Returned demonstration             PT Long Term Goals - 01/21/17 1627      PT LONG TERM GOAL #1   Title Patient will be independent with HEP to continue benefits of therapy after discharge.   Baseline max cues to perform exercises   Time 8   Period Weeks   Status New     PT LONG TERM GOAL #2   Title Patient will score <16sec when performing  5xSTS to indicate decreased fall risk and better be able to perform sitting to standing activities   Baseline 5xSTS: 20sec   Time 8   Period Weeks   Status New     PT LONG TERM GOAL #3   Title Patient will score over 1242ft when performing 6 min walk test indicating decreased fall risk and becoming a safer community ambulator.    Baseline 9minWT: 10105ft   Time 8   Period Weeks   Status New     PT LONG TERM GOAL #4   Title Patient will improve DGI to 24 to demonstrate decreased fall risk and improved ability to ambulate in the community safely   Baseline 16/24   Time 8   Period Weeks   Status New               Plan - 02/15/17 1307    Clinical Impression Statement Patient demonstrates improved stairs performance today demonstrating ability to perform step over step when descending the stairs. Although patient is improving, she continues to demonstrate decreased  strength and will benefit from further skilled therapy to return to prior level of function.    Rehab Potential Good   Clinical Impairments Affecting Rehab Potential (+) family support, highly motivated (-) age, chronicity of condition   PT Frequency 2x / week   PT Duration 8 weeks   PT Treatment/Interventions ADLs/Self Care Home Management;Moist Heat;Ultrasound;Therapeutic exercise;Therapeutic activities;Balance training;Stair training;Gait training;Neuromuscular re-education;Patient/family education;Passive range of motion;Manual techniques   PT Next Visit Plan Progress strengthening and balance activities   PT Home Exercise Plan Sit to stands   Consulted and Agree with Plan of Care Patient      Patient will benefit from skilled therapeutic intervention in order to improve the following deficits and impairments:  Abnormal gait, Decreased strength, Decreased endurance, Decreased activity tolerance, Decreased balance, Difficulty walking, Impaired flexibility, Decreased range of motion, Increased muscle spasms, Decreased mobility, Decreased coordination, Pain, Increased fascial restricitons  Visit Diagnosis: Other abnormalities of gait and mobility  Difficulty in walking, not elsewhere classified  Muscle weakness (generalized)     Problem List Patient Active Problem List   Diagnosis Date Noted  . Prediabetes 06/08/2016  . Obesity (BMI 30.0-34.9) 06/08/2016  . Vitamin D deficiency 06/08/2016  . Post-menopausal 12/11/2015  . DD (diverticular disease) 06/19/2015  . Edema extremities 06/19/2015  . Esophageal reflux 06/19/2015  . HLD (hyperlipidemia) 06/19/2015  . Varicose veins 06/19/2015  . Recurrent UTI 02/08/2013  . Mixed incontinence 02/08/2013  . History of breast cancer, left, T1, N0, Left MRM - Dec 1996 03/18/2012  . Tension headache 05/24/2007    Blythe Stanford, PT DPT 02/15/2017, 1:13 PM  [place] MAIN [facility] SERVICES [address] [place], [place], [address] Phone: [phone]   Fax:  [phone]  Name: Christina Salas MRN: [id number] Date of Birth: [birth year]

## 2017-02-17 ENCOUNTER — Ambulatory Visit: Payer: Medicare Other

## 2017-02-17 DIAGNOSIS — R262 Difficulty in walking, not elsewhere classified: Secondary | ICD-10-CM

## 2017-02-17 DIAGNOSIS — M6281 Muscle weakness (generalized): Secondary | ICD-10-CM

## 2017-02-17 DIAGNOSIS — R2689 Other abnormalities of gait and mobility: Secondary | ICD-10-CM

## 2017-02-17 NOTE — Therapy (Signed)
[place] MAIN [facility] SERVICES [address] [place], [place], [address] Phone: [phone]   Fax:  [phone]  Physical Therapy Treatment  Patient Details  Name: Christina Salas MRN: [id number] Date of Birth: 1933-09-18 Referring Provider: Willodean Rosenthal MD  Encounter Date: 02/17/2017      PT End of Session - 02/17/17 1258    Visit Number 9   Number of Visits 16   Date for PT Re-Evaluation 03/18/17   Authorization Type 9/10 G Code   PT Start Time 1100   PT Stop Time 1145   PT Time Calculation (min) 45 min   Equipment Utilized During Treatment Gait belt   Activity Tolerance Patient tolerated treatment well   Behavior During Therapy [facility] for tasks assessed/performed      Past Medical History:  Diagnosis Date  . Abnormal glucose   . Arthritis   . Breast cancer ([place])   . Cancer ([place])   . Chronic headaches   . GERD (gastroesophageal reflux disease)   . H/O melanoma excision   . History of diverticulitis   . History of recurrent UTIs   . Osteoporosis    pre-osetoporosis per patient history form 03/18/12  . Vitamin D deficiency     Past Surgical History:  Procedure Laterality Date  . ABDOMINAL HYSTERECTOMY  2001  . COLONOSCOPY  04/02/2014  . CYSTOSCOPY  11/2014  . MASTECTOMY  1996   Left  . MELANOMA EXCISION  1995   Head    There were no vitals filed for this visit.      Subjective Assessment - 02/17/17 1256    Subjective Patient reports ascending the stairs is becoming easier to perform. Reports she would like to perform her HEP over the next 5 days.    Pertinent History Resting angina, GERD, HAs, patient reports bilateral swelling in Lower LEs B, Patient reports R sided sciatic pain with symptoms radiaiting down R LE into her knee.   Limitations Standing;Lifting;Walking;Other (comment)  Stairs   How long can you walk comfortably? 80min   Currently in Pain? No/denies   Pain Onset More than a month ago      TREATMENT:  Nustep seat position at 8 - resistance level 6 - 5 min with cueing on speed of performance  Leg Press at [employer] with cueing on speed and knee positioning - x10 150#, x20, x10 135# Side stepping on airex beam with hands hovering over bar - x 10 down and back Tandem amb on airex beam with intermittent hand use over bar - x10 down and back Sit to stands with lifting arms overhead in standing with 4# ball -  2 x 10 Heel raises off of blue half foam - 2 x 20 2 flights of steps with Unilateral UE support - x 2 step over step Lateral walk outs at MATRIX machine - 12.5# x2 bilaterally        PT Education - 02/17/17 1257    Education provided Yes   Education Details Step over step technique with descending the stairs   Person(s) Educated Patient   Methods Explanation;Demonstration   Comprehension Verbalized understanding;Returned demonstration             PT Long Term Goals - 01/21/17 1627      PT LONG TERM GOAL #1   Title Patient will be independent with HEP to continue benefits of therapy after discharge.   Baseline max cues to perform exercises   Time 8   Period Weeks  Status New     PT LONG TERM GOAL #2   Title Patient will score <16sec when performing 5xSTS to indicate decreased fall risk and better be able to perform sitting to standing activities   Baseline 5xSTS: 20sec   Time 8   Period Weeks   Status New     PT LONG TERM GOAL #3   Title Patient will score over 1218ft when performing 6 min walk test indicating decreased fall risk and becoming a safer community ambulator.    Baseline 47minWT: 1066ft   Time 8   Period Weeks   Status New     PT LONG TERM GOAL #4   Title Patient will improve DGI to 24 to demonstrate decreased fall risk and improved ability to ambulate in the community safely   Baseline 16/24   Time 8   Period Weeks   Status New               Plan - 02/17/17 1258    Clinical Impression Statement Patient demonstrates improved  performance with stairs demonstrating improved ability to perform step over step with increased speed. Patient demonstrates decreased LE strength and will benefit from further skilled therapy focused on improving weakness and balance difficulties to return to prior level of function.    Rehab Potential Good   Clinical Impairments Affecting Rehab Potential (+) family support, highly motivated (-) age, chronicity of condition   PT Frequency 2x / week   PT Duration 8 weeks   PT Treatment/Interventions ADLs/Self Care Home Management;Moist Heat;Ultrasound;Therapeutic exercise;Therapeutic activities;Balance training;Stair training;Gait training;Neuromuscular re-education;Patient/family education;Passive range of motion;Manual techniques   PT Next Visit Plan Progress strengthening and balance activities   PT Home Exercise Plan Sit to stands   Consulted and Agree with Plan of Care Patient      Patient will benefit from skilled therapeutic intervention in order to improve the following deficits and impairments:  Abnormal gait, Decreased strength, Decreased endurance, Decreased activity tolerance, Decreased balance, Difficulty walking, Impaired flexibility, Decreased range of motion, Increased muscle spasms, Decreased mobility, Decreased coordination, Pain, Increased fascial restricitons  Visit Diagnosis: Other abnormalities of gait and mobility  Difficulty in walking, not elsewhere classified  Muscle weakness (generalized)     Problem List Patient Active Problem List   Diagnosis Date Noted  . Prediabetes 06/08/2016  . Obesity (BMI 30.0-34.9) 06/08/2016  . Vitamin D deficiency 06/08/2016  . Post-menopausal 12/11/2015  . DD (diverticular disease) 06/19/2015  . Edema extremities 06/19/2015  . Esophageal reflux 06/19/2015  . HLD (hyperlipidemia) 06/19/2015  . Varicose veins 06/19/2015  . Recurrent UTI 02/08/2013  . Mixed incontinence 02/08/2013  . History of breast cancer, left, T1, N0, Left  MRM - Dec 1996 03/18/2012  . Tension headache 05/24/2007    Blythe Stanford, PT DPT 02/17/2017, 1:03 PM  [place] MAIN [facility] SERVICES [address] [place], [place], [address] Phone: [phone]   Fax:  [phone]  Name: JACKIE STURM MRN: [id number] Date of Birth: Jul 03, 1933

## 2017-02-22 ENCOUNTER — Other Ambulatory Visit: Payer: Self-pay | Admitting: Family Medicine

## 2017-02-22 NOTE — Telephone Encounter (Signed)
Patient requesting refill of Nexium to [facility].

## 2017-02-23 ENCOUNTER — Ambulatory Visit: Payer: Medicare Other

## 2017-02-23 DIAGNOSIS — R262 Difficulty in walking, not elsewhere classified: Secondary | ICD-10-CM

## 2017-02-23 DIAGNOSIS — R2689 Other abnormalities of gait and mobility: Secondary | ICD-10-CM | POA: Diagnosis not present

## 2017-02-23 DIAGNOSIS — M6281 Muscle weakness (generalized): Secondary | ICD-10-CM

## 2017-02-23 NOTE — Therapy (Signed)
[place] MAIN [facility] SERVICES [address] [place], [place], [address] Phone: [phone]   Fax:  [phone]  Physical Therapy Treatment  Patient Details  Name: Christina Salas MRN: [id number] Date of Birth: 05-25-1933 Referring Provider: Willodean Rosenthal MD  Encounter Date: 02/23/2017      PT End of Session - 02/23/17 1100    Visit Number 10   Number of Visits 16   Date for PT Re-Evaluation 03/18/17   Authorization Type 10/10 G Code   PT Start Time [id number]   PT Stop Time 1100   PT Time Calculation (min) 45 min   Equipment Utilized During Treatment Gait belt   Activity Tolerance Patient tolerated treatment well   Behavior During Therapy [facility] for tasks assessed/performed      Past Medical History:  Diagnosis Date  . Abnormal glucose   . Arthritis   . Breast cancer ([place])   . Cancer ([place])   . Chronic headaches   . GERD (gastroesophageal reflux disease)   . H/O melanoma excision   . History of diverticulitis   . History of recurrent UTIs   . Osteoporosis    pre-osetoporosis per patient history form 03/18/12  . Vitamin D deficiency     Past Surgical History:  Procedure Laterality Date  . ABDOMINAL HYSTERECTOMY  2001  . COLONOSCOPY  04/02/2014  . CYSTOSCOPY  11/2014  . MASTECTOMY  1996   Left  . MELANOMA EXCISION  1995   Head    There were no vitals filed for this visit.      Subjective Assessment - 02/23/17 1011    Subjective Patient reports she went to a funeral over the weekend. States no new falls since her previous visit.    Pertinent History Resting angina, GERD, HAs, patient reports bilateral swelling in Lower LEs B, Patient reports R sided sciatic pain with symptoms radiaiting down R LE into her knee.   Limitations Standing;Lifting;Walking;Other (comment)  Stairs   How long can you walk comfortably? 72min   Currently in Pain? No/denies   Pain Onset More than a month ago            [facility] PT Assessment -  02/23/17 0001      6 minute walk test results    Aerobic Endurance Distance Walked 1150     Standardized Balance Assessment   Five times sit to stand comments  11.5sec     Dynamic Gait Index   Level Surface Normal   Change in Gait Speed Normal   Gait with Horizontal Head Turns Mild Impairment   Gait with Vertical Head Turns Normal   Gait and Pivot Turn Mild Impairment   Step Over Obstacle Normal   Step Around Obstacles Mild Impairment   Steps Mild Impairment   Total Score 20        TREATMENT:  Nustep seat position at 8 - resistance level 6 - 5 min with cueing on speed of performance  Leg Press at Quantum with cueing on speed and knee positioning - x10 150#,  Sit to stands with cueing on speed of performance - x 5 Ambulation with head turns up/down; left/right - 2 x 22ft 4 stairs with focus on improving knee positioning with step over step Feet together EO balance on black side of Bosu - 2 x 30sec Side stepping up and over the bosu - x10 with unilateral UE support Forward step ups onto Bosu ball - x15 with intermittent UE support Weight shifts on  black side of the bosu ball - x10 laterally 1 flight of steps with Unilateral UE support - x 2 step over step      PT Education - 02-25-17 1018    Education provided Yes   Education Details POC reinforced HEP    Person(s) Educated Patient   Methods Explanation;Demonstration   Comprehension Verbalized understanding;Returned demonstration             PT Long Term Goals - 01/21/17 1627      PT LONG TERM GOAL #1   Title Patient will be independent with HEP to continue benefits of therapy after discharge.   Baseline max cues to perform exercises   Time 8   Period Weeks   Status New     PT LONG TERM GOAL #2   Title Patient will score <16sec when performing 5xSTS to indicate decreased fall risk and better be able to perform sitting to standing activities   Baseline 5xSTS: 20sec   Time 8   Period Weeks   Status New      PT LONG TERM GOAL #3   Title Patient will score over 1249ft when performing 6 min walk test indicating decreased fall risk and becoming a safer community ambulator.    Baseline 32minWT: 1088ft   Time 8   Period Weeks   Status New     PT LONG TERM GOAL #4   Title Patient will improve DGI to 24 to demonstrate decreased fall risk and improved ability to ambulate in the community safely   Baseline 16/24   Time 8   Period Weeks   Status New               Plan - February 25, 2017 1309    Clinical Impression Statement Patient demonstrates improvement in DGI, 5xSTS, and 98min walk test indicating functional improvement in LE strength, endurance, and dynamic balance. Although patient is improving, she continues to demonstrate decreased dynamic balance and LE endurance. Patient will benefit from further skilled therapy focused on improving currently limitations to return to prior level of function.    Rehab Potential Good   Clinical Impairments Affecting Rehab Potential (+) family support, highly motivated (-) age, chronicity of condition   PT Frequency 2x / week   PT Duration 8 weeks   PT Treatment/Interventions ADLs/Self Care Home Management;Moist Heat;Ultrasound;Therapeutic exercise;Therapeutic activities;Balance training;Stair training;Gait training;Neuromuscular re-education;Patient/family education;Passive range of motion;Manual techniques   PT Next Visit Plan Progress strengthening and balance activities   PT Home Exercise Plan Sit to stands   Consulted and Agree with Plan of Care Patient      Patient will benefit from skilled therapeutic intervention in order to improve the following deficits and impairments:  Abnormal gait, Decreased strength, Decreased endurance, Decreased activity tolerance, Decreased balance, Difficulty walking, Impaired flexibility, Decreased range of motion, Increased muscle spasms, Decreased mobility, Decreased coordination, Pain, Increased fascial restricitons  Visit  Diagnosis: Muscle weakness (generalized)  Difficulty in walking, not elsewhere classified       G-Codes - 02-25-17 1313    Functional Assessment Tool Used (Outpatient Only) DGI, 70minWT, clinical judgement   Functional Limitation Mobility: Walking and moving around   Mobility: Walking and Moving Around Current Status [id number]) At least 20 percent but less than 40 percent impaired, limited or restricted   Mobility: Walking and Moving Around Goal Status [phone]) At least 1 percent but less than 20 percent impaired, limited or restricted      Problem List Patient Active Problem List   Diagnosis Date  Noted  . Prediabetes 06/08/2016  . Obesity (BMI 30.0-34.9) 06/08/2016  . Vitamin D deficiency 06/08/2016  . Post-menopausal 12/11/2015  . DD (diverticular disease) 06/19/2015  . Edema extremities 06/19/2015  . Esophageal reflux 06/19/2015  . HLD (hyperlipidemia) 06/19/2015  . Varicose veins 06/19/2015  . Recurrent UTI 02/08/2013  . Mixed incontinence 02/08/2013  . History of breast cancer, left, T1, N0, Left MRM - Dec 1996 03/18/2012  . Tension headache 05/24/2007    Blythe Stanford, PT DPT 02/23/2017, 1:15 PM  [place] MAIN [facility] SERVICES [address] [place], [place], [address] Phone: [phone]   Fax:  [phone]  Name: JACKIE STURM MRN: [id number] Date of Birth: 09-09-1933

## 2017-02-24 ENCOUNTER — Other Ambulatory Visit: Payer: Self-pay | Admitting: Family Medicine

## 2017-02-24 NOTE — Telephone Encounter (Signed)
PT IS NEEDING REFILL ON NEXIUM ( WANTS GENERIC). [place]

## 2017-02-25 ENCOUNTER — Ambulatory Visit: Payer: Medicare Other | Attending: Family Medicine

## 2017-02-25 DIAGNOSIS — M6281 Muscle weakness (generalized): Secondary | ICD-10-CM | POA: Insufficient documentation

## 2017-02-25 DIAGNOSIS — R2689 Other abnormalities of gait and mobility: Secondary | ICD-10-CM | POA: Diagnosis present

## 2017-02-25 DIAGNOSIS — R262 Difficulty in walking, not elsewhere classified: Secondary | ICD-10-CM | POA: Diagnosis present

## 2017-02-25 MED ORDER — ESOMEPRAZOLE MAGNESIUM 40 MG PO CPDR: 40.0000 mg | DELAYED_RELEASE_CAPSULE | Freq: Every day | ORAL | 0 refills | Status: DC

## 2017-02-25 NOTE — Therapy (Signed)
[place] MAIN [facility] SERVICES [address] [place], [place], [address] Phone: [phone]   Fax:  [phone]  Physical Therapy Treatment  Patient Details  Name: Christina Salas MRN: [id number] Date of Birth: [birth year] Referring Provider: Willodean Rosenthal MD  Encounter Date: 02/25/2017      PT End of Session - 02/25/17 0941    Visit Number 11   Number of Visits 16   Date for PT Re-Evaluation 03/18/17   Authorization Type 1/10 G Code   PT Start Time 0930   PT Stop Time 1015   PT Time Calculation (min) 45 min   Equipment Utilized During Treatment Gait belt   Activity Tolerance Patient tolerated treatment well   Behavior During Therapy [facility] for tasks assessed/performed      Past Medical History:  Diagnosis Date  . Abnormal glucose   . Arthritis   . Breast cancer ([place])   . Cancer ([place])   . Chronic headaches   . GERD (gastroesophageal reflux disease)   . H/O melanoma excision   . History of diverticulitis   . History of recurrent UTIs   . Osteoporosis    pre-osetoporosis per patient history form 03/18/12  . Vitamin D deficiency     Past Surgical History:  Procedure Laterality Date  . ABDOMINAL HYSTERECTOMY  2001  . COLONOSCOPY  04/02/2014  . CYSTOSCOPY  11/2014  . MASTECTOMY  1996   Left  . MELANOMA EXCISION  1995   Head    There were no vitals filed for this visit.      Subjective Assessment - 02/25/17 0937    Subjective Patient reports no new symptoms and states no changes since the previous visit.    Pertinent History Resting angina, GERD, HAs, patient reports bilateral swelling in Lower LEs B, Patient reports R sided sciatic pain with symptoms radiaiting down R LE into her knee.   Limitations Standing;Lifting;Walking;Other (comment)  Stairs   How long can you walk comfortably? 53min   Currently in Pain? No/denies   Pain Onset More than a month ago      TREATMENT:  Nustep seat position at 8 - resistance  level 5 - 5 min with cueing on speed of performance  Leg Press at Quantum with cueing on speed and knee positioning - x20 150#, Weight shifts on black side of the bosu ball - x20 laterally, ant/post Feet together balancing on black side of Bosu - 2 min with intermittent UE support Side stepping over airex beam - x10 without UE support Single leg stance B on airex beam - 2 x 30sec Monster walks with GTB - 2 x 91ft 2 flight of steps with Unilateral UE support - x 2 step over step       PT Education - 02/25/17 0938    Education provided Yes   Education Details Form/technique throughout exercise   Person(s) Educated Patient   Methods Explanation;Demonstration   Comprehension Verbalized understanding;Returned demonstration             PT Long Term Goals - 01/21/17 1627      PT LONG TERM GOAL #1   Title Patient will be independent with HEP to continue benefits of therapy after discharge.   Baseline max cues to perform exercises   Time 8   Period Weeks   Status New     PT LONG TERM GOAL #2   Title Patient will score <16sec when performing 5xSTS to indicate decreased fall risk and better be  able to perform sitting to standing activities   Baseline 5xSTS: 20sec   Time 8   Period Weeks   Status New     PT LONG TERM GOAL #3   Title Patient will score over 1273ft when performing 6 min walk test indicating decreased fall risk and becoming a safer community ambulator.    Baseline 73minWT: 101ft   Time 8   Period Weeks   Status New     PT LONG TERM GOAL #4   Title Patient will improve DGI to 24 to demonstrate decreased fall risk and improved ability to ambulate in the community safely   Baseline 16/24   Time 8   Period Weeks   Status New               Plan - 02/25/17 [id number]    Clinical Impression Statement Patient  demonstrates improvement in muscular strength in LEs with ability to performer greater amount of repetitions with leg press exercises. Although patient is  improving, she continues to demonstrate decreased muscular strength and balance;  pt will benefit from further skilled therapy to return to prior level of function.    Rehab Potential Good   Clinical Impairments Affecting Rehab Potential (+) family support, highly motivated (-) age, chronicity of condition   PT Frequency 2x / week   PT Duration 8 weeks   PT Treatment/Interventions ADLs/Self Care Home Management;Moist Heat;Ultrasound;Therapeutic exercise;Therapeutic activities;Balance training;Stair training;Gait training;Neuromuscular re-education;Patient/family education;Passive range of motion;Manual techniques   PT Next Visit Plan Progress strengthening and balance activities   PT Home Exercise Plan Sit to stands   Consulted and Agree with Plan of Care Patient      Patient will benefit from skilled therapeutic intervention in order to improve the following deficits and impairments:  Abnormal gait, Decreased strength, Decreased endurance, Decreased activity tolerance, Decreased balance, Difficulty walking, Impaired flexibility, Decreased range of motion, Increased muscle spasms, Decreased mobility, Decreased coordination, Pain, Increased fascial restricitons  Visit Diagnosis: Muscle weakness (generalized)  Difficulty in walking, not elsewhere classified  Other abnormalities of gait and mobility     Problem List Patient Active Problem List   Diagnosis Date Noted  . Prediabetes 06/08/2016  . Obesity (BMI 30.0-34.9) 06/08/2016  . Vitamin D deficiency 06/08/2016  . Post-menopausal 12/11/2015  . DD (diverticular disease) 06/19/2015  . Edema extremities 06/19/2015  . Esophageal reflux 06/19/2015  . HLD (hyperlipidemia) 06/19/2015  . Varicose veins 06/19/2015  . Recurrent UTI 02/08/2013  . Mixed incontinence 02/08/2013  . History of breast cancer, left, T1, N0, Left MRM - Dec 1996 03/18/2012  . Tension headache 05/24/2007    Blythe Stanford, PT DPT 02/25/2017, 10:37 AM  [place] MAIN [facility] SERVICES [address] [place], [place], [address] Phone: [phone]   Fax:  [phone]  Name: Christina Salas MRN: [id number] Date of Birth: 1933-04-10

## 2017-03-02 ENCOUNTER — Ambulatory Visit: Payer: Medicare Other

## 2017-03-02 DIAGNOSIS — R262 Difficulty in walking, not elsewhere classified: Secondary | ICD-10-CM

## 2017-03-02 DIAGNOSIS — M6281 Muscle weakness (generalized): Secondary | ICD-10-CM

## 2017-03-02 DIAGNOSIS — R2689 Other abnormalities of gait and mobility: Secondary | ICD-10-CM

## 2017-03-02 NOTE — Therapy (Signed)
[name] [facility] SERVICES [address] [place], [place], [address] Phone: [phone]   Fax:  [phone]  Physical Therapy Treatment  Patient Details  Name: Christina Salas MRN: [id number] Date of Birth: 01-03-1933 Referring Provider: Willodean Rosenthal MD  Encounter Date: 03/02/2017      PT End of Session - 03/02/17 0954    Visit Number 12   Number of Visits 16   Date for PT Re-Evaluation 03/18/17   Authorization Type 2/10 G Code   PT Start Time 0900   PT Stop Time 0945   PT Time Calculation (min) 45 min   Equipment Utilized During Treatment Gait belt   Activity Tolerance Patient tolerated treatment well   Behavior During Therapy [facility] for tasks assessed/performed      Past Medical History:  Diagnosis Date  . Abnormal glucose   . Arthritis   . Breast cancer ([place])   . Cancer ([place])   . Chronic headaches   . GERD (gastroesophageal reflux disease)   . H/O melanoma excision   . History of diverticulitis   . History of recurrent UTIs   . Osteoporosis    pre-osetoporosis per patient history form 03/18/12  . Vitamin D deficiency     Past Surgical History:  Procedure Laterality Date  . ABDOMINAL HYSTERECTOMY  2001  . COLONOSCOPY  04/02/2014  . CYSTOSCOPY  11/2014  . MASTECTOMY  1996   Left  . MELANOMA EXCISION  1995   Head    There were no vitals filed for this visit.      Subjective Assessment - 03/02/17 0942    Subjective Patient reports no major changes since the previous visit. Patient reports her hip feels uncomfortable but not painful.    Pertinent History Resting angina, GERD, HAs, patient reports bilateral swelling in Lower LEs B, Patient reports R sided sciatic pain with symptoms radiaiting down R LE into her knee.   Limitations Standing;Lifting;Walking;Other (comment)  Stairs   How long can you walk comfortably? 40min   Currently in Pain? No/denies   Pain Onset More than a month ago      TREATMENT:   Nustep seat position at 8 - resistance level 5 - 5 min with cueing on speed of performance  Leg Press at Quantum with cueing on speed and knee positioning - 2x20 150# 2 flight of steps with Unilateral UE support - x 1 step over step Hip abduction with GTB - 2 x 20  Call out numbers with GTB around the knees - x5 mins Hip drops with frequent tactile and verbal cueing for performance - 2 x 20 Hip extension in standing - 2 x 20 with UE support Step taps onto second step from airex - 2 x 20 with fingertip support       PT Education - 03/02/17 0953    Education provided Yes   Education Details Form/technique with hip mobility exercises   Person(s) Educated Patient   Methods Explanation;Demonstration   Comprehension Verbalized understanding;Returned demonstration             PT Long Term Goals - 01/21/17 1627      PT LONG TERM GOAL #1   Title Patient will be independent with HEP to continue benefits of therapy after discharge.   Baseline max cues to perform exercises   Time 8   Period Weeks   Status New     PT LONG TERM GOAL #2   Title Patient will score <16sec when performing  5xSTS to indicate decreased fall risk and better be able to perform sitting to standing activities   Baseline 5xSTS: 20sec   Time 8   Period Weeks   Status New     PT LONG TERM GOAL #3   Title Patient will score over 1247ft when performing 6 min walk test indicating decreased fall risk and becoming a safer community ambulator.    Baseline 4minWT: 1050ft   Time 8   Period Weeks   Status New     PT LONG TERM GOAL #4   Title Patient will improve DGI to 24 to demonstrate decreased fall risk and improved ability to ambulate in the community safely   Baseline 16/24   Time 8   Period Weeks   Status New               Plan - 03/02/17 1018    Clinical Impression Statement Patient demonstrates decreased hip strength and coordination most noteably in hip rotations and focused therapy on  improving cooridnation. Patient demonstrates improvement in LE strength as indicated by ability to perform greater amount of resistance with leg press machine. Patient will benefit from furtther skilled therapy focused on improving strength and coordination to return to prior level of function.    Rehab Potential Good   Clinical Impairments Affecting Rehab Potential (+) family support, highly motivated (-) age, chronicity of condition   PT Frequency 2x / week   PT Duration 8 weeks   PT Treatment/Interventions ADLs/Self Care Home Management;Moist Heat;Ultrasound;Therapeutic exercise;Therapeutic activities;Balance training;Stair training;Gait training;Neuromuscular re-education;Patient/family education;Passive range of motion;Manual techniques   PT Next Visit Plan Progress strengthening and balance activities   PT Home Exercise Plan Sit to stands   Consulted and Agree with Plan of Care Patient      Patient will benefit from skilled therapeutic intervention in order to improve the following deficits and impairments:  Abnormal gait, Decreased strength, Decreased endurance, Decreased activity tolerance, Decreased balance, Difficulty walking, Impaired flexibility, Decreased range of motion, Increased muscle spasms, Decreased mobility, Decreased coordination, Pain, Increased fascial restricitons  Visit Diagnosis: Muscle weakness (generalized)  Difficulty in walking, not elsewhere classified  Other abnormalities of gait and mobility     Problem List Patient Active Problem List   Diagnosis Date Noted  . Prediabetes 06/08/2016  . Obesity (BMI 30.0-34.9) 06/08/2016  . Vitamin D deficiency 06/08/2016  . Post-menopausal 12/11/2015  . DD (diverticular disease) 06/19/2015  . Edema extremities 06/19/2015  . Esophageal reflux 06/19/2015  . HLD (hyperlipidemia) 06/19/2015  . Varicose veins 06/19/2015  . Recurrent UTI 02/08/2013  . Mixed incontinence 02/08/2013  . History of breast cancer, left,  T1, N0, Left MRM - Dec 1996 03/18/2012  . Tension headache 05/24/2007    Blythe Stanford, PT DPT 03/02/2017, 10:34 AM  [place] MAIN [facility] SERVICES [address] [place], [place], [address] Phone: [phone]   Fax:  [phone]  Name: Christina Salas MRN: [id number] Date of Birth: 08-Sep-1933

## 2017-03-04 ENCOUNTER — Ambulatory Visit: Payer: Medicare Other

## 2017-03-04 DIAGNOSIS — M6281 Muscle weakness (generalized): Secondary | ICD-10-CM | POA: Diagnosis not present

## 2017-03-04 DIAGNOSIS — R262 Difficulty in walking, not elsewhere classified: Secondary | ICD-10-CM

## 2017-03-04 DIAGNOSIS — R2689 Other abnormalities of gait and mobility: Secondary | ICD-10-CM

## 2017-03-04 NOTE — Therapy (Signed)
[place] MAIN [facility] SERVICES [address] [place], [place], [address] Phone: [phone]   Fax:  [phone]  Physical Therapy Treatment  Patient Details  Name: Christina Salas MRN: [id number] Date of Birth: Sep 07, 1933 Referring Provider: Willodean Rosenthal MD  Encounter Date: 03/04/2017      PT End of Session - 03/04/17 1309    Visit Number 13   Number of Visits 16   Date for PT Re-Evaluation 03/18/17   Authorization Type 3/10 G Code   PT Start Time 1300   PT Stop Time 1345   PT Time Calculation (min) 45 min   Equipment Utilized During Treatment Gait belt   Activity Tolerance Patient tolerated treatment well   Behavior During Therapy [facility] for tasks assessed/performed      Past Medical History:  Diagnosis Date  . Abnormal glucose   . Arthritis   . Breast cancer ([place])   . Cancer ([place])   . Chronic headaches   . GERD (gastroesophageal reflux disease)   . H/O melanoma excision   . History of diverticulitis   . History of recurrent UTIs   . Osteoporosis    pre-osetoporosis per patient history form 03/18/12  . Vitamin D deficiency     Past Surgical History:  Procedure Laterality Date  . ABDOMINAL HYSTERECTOMY  2001  . COLONOSCOPY  04/02/2014  . CYSTOSCOPY  11/2014  . MASTECTOMY  1996   Left  . MELANOMA EXCISION  1995   Head    There were no vitals filed for this visit.      Subjective Assessment - 03/04/17 1307    Subjective Patient reports no major changes since the previous visit. Patient reports minor R hip pain today.    Pertinent History Resting angina, GERD, HAs, patient reports bilateral swelling in Lower LEs B, Patient reports R sided sciatic pain with symptoms radiaiting down R LE into her knee.   Limitations Standing;Lifting;Walking;Other (comment)  Stairs   How long can you walk comfortably? 19min   Currently in Pain? Yes   Pain Score 3    Pain Location Hip   Pain Orientation Right   Pain Descriptors /  Indicators Aching   Pain Type Chronic pain   Pain Onset More than a month ago      TREATMENT:  Nustep seat position at 8 - resistance level 5 - 5 min with cueing on speed of performance  2 flight of steps with Unilateral UE support - x 1 step over step Clamshells in sidelying - 2 x 20  Hip Abduction in sidelying - 2 x 20  Numbers sidestepping/walking in square - x10 cw/ccw Wobbleboard Lateral tapping - x20 with intermittent  Wobbleboard with balancing on the top position - 3 x 30sec Rockerboard balance at top position - 2 x 30sec Hip abduction with GTB - 2 x 20        PT Education - 03/04/17 1308    Education provided Yes   Education Details Form/technique with exercises   Person(s) Educated Patient   Methods Explanation;Demonstration   Comprehension Verbalized understanding;Returned demonstration             PT Long Term Goals - 01/21/17 1627      PT LONG TERM GOAL #1   Title Patient will be independent with HEP to continue benefits of therapy after discharge.   Baseline max cues to perform exercises   Time 8   Period Weeks   Status New     PT  LONG TERM GOAL #2   Title Patient will score <16sec when performing 5xSTS to indicate decreased fall risk and better be able to perform sitting to standing activities   Baseline 5xSTS: 20sec   Time 8   Period Weeks   Status New     PT LONG TERM GOAL #3   Title Patient will score over 124ft when performing 6 min walk test indicating decreased fall risk and becoming a safer community ambulator.    Baseline 58minWT: 1064ft   Time 8   Period Weeks   Status New     PT LONG TERM GOAL #4   Title Patient will improve DGI to 24 to demonstrate decreased fall risk and improved ability to ambulate in the community safely   Baseline 16/24   Time 8   Period Weeks   Status New               Plan - 03/04/17 1327    Clinical Impression Statement Patient demonstrates increased pain and weakness over the glute med most  noteable when performing hip ER and abduction in sidelying. Patient continues to demonstrate decreased LE strength/balance and will benefit from further skilled therapy to return to prior level of function.    Rehab Potential Good   Clinical Impairments Affecting Rehab Potential (+) family support, highly motivated (-) age, chronicity of condition   PT Frequency 2x / week   PT Duration 8 weeks   PT Treatment/Interventions ADLs/Self Care Home Management;Moist Heat;Ultrasound;Therapeutic exercise;Therapeutic activities;Balance training;Stair training;Gait training;Neuromuscular re-education;Patient/family education;Passive range of motion;Manual techniques   PT Next Visit Plan Progress strengthening and balance activities   PT Home Exercise Plan Sit to stands   Consulted and Agree with Plan of Care Patient      Patient will benefit from skilled therapeutic intervention in order to improve the following deficits and impairments:  Abnormal gait, Decreased strength, Decreased endurance, Decreased activity tolerance, Decreased balance, Difficulty walking, Impaired flexibility, Decreased range of motion, Increased muscle spasms, Decreased mobility, Decreased coordination, Pain, Increased fascial restricitons  Visit Diagnosis: Muscle weakness (generalized)  Difficulty in walking, not elsewhere classified  Other abnormalities of gait and mobility     Problem List Patient Active Problem List   Diagnosis Date Noted  . Prediabetes 06/08/2016  . Obesity (BMI 30.0-34.9) 06/08/2016  . Vitamin D deficiency 06/08/2016  . Post-menopausal 12/11/2015  . DD (diverticular disease) 06/19/2015  . Edema extremities 06/19/2015  . Esophageal reflux 06/19/2015  . HLD (hyperlipidemia) 06/19/2015  . Varicose veins 06/19/2015  . Recurrent UTI 02/08/2013  . Mixed incontinence 02/08/2013  . History of breast cancer, left, T1, N0, Left MRM - Dec 1996 03/18/2012  . Tension headache 05/24/2007    Blythe Stanford, PT DPT 03/04/2017, 1:50 PM  [place] MAIN [facility] SERVICES [address] [place], [place], [address] Phone: [phone]   Fax:  [phone]  Name: Christina Salas MRN: [id number] Date of Birth: 03-10-1933

## 2017-03-08 ENCOUNTER — Ambulatory Visit: Payer: Medicare Other

## 2017-03-10 ENCOUNTER — Ambulatory Visit: Payer: Medicare Other

## 2017-03-10 DIAGNOSIS — R2689 Other abnormalities of gait and mobility: Secondary | ICD-10-CM

## 2017-03-10 DIAGNOSIS — M6281 Muscle weakness (generalized): Secondary | ICD-10-CM | POA: Diagnosis not present

## 2017-03-10 DIAGNOSIS — R262 Difficulty in walking, not elsewhere classified: Secondary | ICD-10-CM

## 2017-03-10 NOTE — Therapy (Signed)
[place] MAIN [facility] SERVICES [address] [place], [place], [address] Phone: [phone]   Fax:  [phone]  Physical Therapy Treatment  Patient Details  Name: Christina Salas MRN: [id number] Date of Birth: 1933-12-02 Referring Provider: Willodean Rosenthal MD  Encounter Date: 03/10/2017    Patient attended 14 out of 14 visits and has achieved and parietally achieved all goals. Patient to be discharged from PT       PT End of Session - 03/10/17 1341    Visit Number 14   Number of Visits 16   Date for PT Re-Evaluation 03/18/17   Authorization Type 4/10 G Code   PT Start Time 1300   PT Stop Time 1345   PT Time Calculation (min) 45 min   Equipment Utilized During Treatment Gait belt   Activity Tolerance Patient tolerated treatment well   Behavior During Therapy WFL for tasks assessed/performed      Past Medical History:  Diagnosis Date  . Abnormal glucose   . Arthritis   . Breast cancer ([place])   . Cancer ([place])   . Chronic headaches   . GERD (gastroesophageal reflux disease)   . H/O melanoma excision   . History of diverticulitis   . History of recurrent UTIs   . Osteoporosis    pre-osetoporosis per patient history form 03/18/12  . Vitamin D deficiency     Past Surgical History:  Procedure Laterality Date  . ABDOMINAL HYSTERECTOMY  2001  . COLONOSCOPY  04/02/2014  . CYSTOSCOPY  11/2014  . MASTECTOMY  1996   Left  . MELANOMA EXCISION  1995   Head    There were no vitals filed for this visit.      Subjective Assessment - 03/10/17 1309    Subjective Patient reports no major changes and states she is prepared from discharge.    Pertinent History Resting angina, GERD, HAs, patient reports bilateral swelling in Lower LEs B, Patient reports R sided sciatic pain with symptoms radiaiting down R LE into her knee.   Limitations Standing;Lifting;Walking;Other (comment)  Stairs   How long can you walk comfortably? 37mn   Currently in Pain? No/denies   Pain Onset More than a month ago            [facility] - 03/10/17 0001      6 minute walk test results    Aerobic Endurance Distance Walked 1200     Dynamic Gait Index   Level Surface Normal   Change in Gait Speed Normal   Gait with Horizontal Head Turns Mild Impairment   Gait with Vertical Head Turns Normal   Gait and Pivot Turn Normal   Step Over Obstacle Normal   Step Around Obstacles Normal   Steps Mild Impairment   Total Score 22       TREATMENT:  Nustep seat position at 8 - resistance level 5 - 5 min with cueing on speed of performance  Single stair step ups - 2 x 15 B with UE support Mini Squats with UE support - 2 x 20 UE support Hip abduction in standing - 2 x 20  Single leg stance - 3 x 30sec with intermittent UE support   Ambulation forward with cueing on speed - x 11030f       PT Education - 03/10/17 1333    Education provided Yes   Education Details Update HEP; exercise performance and form   Person(s) Educated Patient   Methods Explanation;Demonstration  Comprehension Verbalized understanding;Returned demonstration             PT Long Term Goals - 03-29-17 1320      PT LONG TERM GOAL #1   Title Patient will be independent with HEP to continue benefits of therapy after discharge.   Baseline max cues to perform exercises; 03-29-17: independent with exercise    Time 8   Period Weeks   Status Achieved     PT LONG TERM GOAL #2   Title Patient will score <16sec when performing 5xSTS to indicate decreased fall risk and better be able to perform sitting to standing activities   Baseline 5xSTS: 20sec 02/23/17: 11.65sec   Time 8   Period Weeks   Status Achieved     PT LONG TERM GOAL #3   Title Patient will score over 1249f when performing 6 min walk test indicating decreased fall risk and becoming a safer community ambulator.    Baseline 671mWT: [id number]/12018/04/02200f18fTime 8   Period Weeks    Status Achieved     PT LONG TERM GOAL #4   Title Patient will improve DGI to 24 to demonstrate decreased fall risk and improved ability to ambulate in the community safely   Baseline 16/24; 02/1403/01/2017/24   Time 8   Period Weeks   Status Partially Met               Plan - 03/104/02/182    Clinical Impression Statement Patient demonstrates singificant improvement in LE function and balance indicated by improvement in DGI, 5xSTS, and 6min54m Patient has met or partially met all long term goals and is to be discharged from therapy. Patient verbally agrees with POC.    Rehab Potential Good   Clinical Impairments Affecting Rehab Potential (+) family support, highly motivated (-) age, chronicity of condition   PT Frequency 2x / week   PT Duration 8 weeks   PT Treatment/Interventions ADLs/Self Care Home Management;Moist Heat;Ultrasound;Therapeutic exercise;Therapeutic activities;Balance training;Stair training;Gait training;Neuromuscular re-education;Patient/family education;Passive range of motion;Manual techniques   PT Next Visit Plan Progress strengthening and balance activities   PT Home Exercise Plan Sit to stands   Consulted and Agree with Plan of Care Patient      Patient will benefit from skilled therapeutic intervention in order to improve the following deficits and impairments:  Abnormal gait, Decreased strength, Decreased endurance, Decreased activity tolerance, Decreased balance, Difficulty walking, Impaired flexibility, Decreased range of motion, Increased muscle spasms, Decreased mobility, Decreased coordination, Pain, Increased fascial restricitons  Visit Diagnosis: Muscle weakness (generalized)  Difficulty in walking, not elsewhere classified  Other abnormalities of gait and mobility       G-Codes - 02/1403/01/2017    Functional Assessment Tool Used (Outpatient Only) DGI, 6minW28mclinical judgement   Functional Limitation Mobility: Walking and moving around    Mobility: Walking and Moving Around Current Status (G8978([id number] 1 percent but less than 20 percent impaired, limited or restricted   Mobility: Walking and Moving Around Goal Status ([phone] 1 percent but less than 20 percent impaired, limited or restricted      Problem List Patient Active Problem List   Diagnosis Date Noted  . Prediabetes 06/08/2016  . Obesity (BMI 30.0-34.9) 06/08/2016  . Vitamin D deficiency 06/08/2016  . Post-menopausal 12/11/2015  . DD (diverticular disease) 06/19/2015  . Edema extremities 06/19/2015  . Esophageal reflux 06/19/2015  . HLD (hyperlipidemia) 06/19/2015  . Varicose veins 06/19/2015  . Recurrent UTI 02/08/2013  .  Mixed incontinence 02/08/2013  . History of breast cancer, left, T1, N0, Left MRM - Dec 1996 03/18/2012  . Tension headache 05/24/2007    Blythe Stanford, PT DPT 03/10/2017, 1:56 PM  [place] MAIN [facility] SERVICES [address] [place], [place], [address] Phone: [phone]   Fax:  [phone]  Name: Christina Salas MRN: [id number] Date of Birth: 1933/06/24

## 2017-03-17 ENCOUNTER — Other Ambulatory Visit: Payer: Self-pay | Admitting: Family Medicine

## 2017-03-17 ENCOUNTER — Other Ambulatory Visit: Payer: Self-pay | Admitting: Surgery

## 2017-03-17 DIAGNOSIS — Z1231 Encounter for screening mammogram for malignant neoplasm of breast: Secondary | ICD-10-CM

## 2017-03-26 ENCOUNTER — Other Ambulatory Visit: Payer: Self-pay | Admitting: Family Medicine

## 2017-03-29 ENCOUNTER — Ambulatory Visit: Payer: Medicare Other | Admitting: Family Medicine

## 2017-03-29 NOTE — Telephone Encounter (Signed)
Sent in a 30 day supply since patient is coming in to see Dr. Ancil Boozer on 04/14/17

## 2017-04-14 ENCOUNTER — Encounter: Payer: Self-pay | Admitting: Family Medicine

## 2017-04-14 ENCOUNTER — Ambulatory Visit (INDEPENDENT_AMBULATORY_CARE_PROVIDER_SITE_OTHER): Payer: Medicare Other | Admitting: Family Medicine

## 2017-04-14 VITALS — BP 116/64 | HR 81 | Temp 97.9°F | Resp 16 | Ht 62.0 in | Wt 179.2 lb

## 2017-04-14 DIAGNOSIS — E784 Other hyperlipidemia: Secondary | ICD-10-CM | POA: Diagnosis not present

## 2017-04-14 DIAGNOSIS — E7849 Other hyperlipidemia: Secondary | ICD-10-CM

## 2017-04-14 DIAGNOSIS — N3946 Mixed incontinence: Secondary | ICD-10-CM

## 2017-04-14 DIAGNOSIS — N390 Urinary tract infection, site not specified: Secondary | ICD-10-CM

## 2017-04-14 DIAGNOSIS — R739 Hyperglycemia, unspecified: Secondary | ICD-10-CM

## 2017-04-14 DIAGNOSIS — Z853 Personal history of malignant neoplasm of breast: Secondary | ICD-10-CM

## 2017-04-14 DIAGNOSIS — G44209 Tension-type headache, unspecified, not intractable: Secondary | ICD-10-CM

## 2017-04-14 DIAGNOSIS — I208 Other forms of angina pectoris: Secondary | ICD-10-CM | POA: Diagnosis not present

## 2017-04-14 DIAGNOSIS — K219 Gastro-esophageal reflux disease without esophagitis: Secondary | ICD-10-CM | POA: Diagnosis not present

## 2017-04-14 DIAGNOSIS — M170 Bilateral primary osteoarthritis of knee: Secondary | ICD-10-CM | POA: Diagnosis not present

## 2017-04-14 DIAGNOSIS — R600 Localized edema: Secondary | ICD-10-CM

## 2017-04-14 DIAGNOSIS — I2089 Other forms of angina pectoris: Secondary | ICD-10-CM

## 2017-04-14 DIAGNOSIS — M5431 Sciatica, right side: Secondary | ICD-10-CM

## 2017-04-14 MED ORDER — ESOMEPRAZOLE MAGNESIUM 40 MG PO CPDR: 40.0000 mg | DELAYED_RELEASE_CAPSULE | Freq: Every day | ORAL | 1 refills | Status: DC

## 2017-04-14 MED ORDER — BUTALBITAL-APAP-CAFFEINE 50-325-40 MG PO TABS: 1.0000 | ORAL_TABLET | Freq: Four times a day (QID) | ORAL | 0 refills | Status: DC | PRN

## 2017-04-14 NOTE — Progress Notes (Signed)
Name: Christina Salas   MRN: [id number]    DOB: [birth year]   Date:04/14/2017       Progress Note  Subjective  Chief Complaint  Chief Complaint  Patient presents with  . Medication Refill    6 month F/U  . Gastroesophageal Reflux    Well controlled with medication  . Edema    Has seen Dr. Clayborn Bigness and he is sending her to [facility] for follow up-April 06, 2017 and going to see Dr. Lucky Cowboy beginning of May  . Headache    Stable    HPI  Angina at rest: she states symptoms were worse last Spring, went to Dr. Clayborn Bigness and had a normal stress test. She states her heart gurgles intermittently, she has SOB with activity. She is doing better now, on aspirin daily and fish oil. Not in statin therapy. She prefers not starting statin  Pre-diabetes: she denies polyphagia, polyuria or polydipsias. We will recheck labs next visit  Edema extremities: she is trying to walk more, wearing compression stocking hoses, but sometimes does not seem to help with swelling. Denies claudication but has appointment scheduled with Dr. Clayborn Bigness  Right sciatica/OA knee:she states she went to PT, and it helped with knee pain, but still has intermittent right lower back pain that radiates to right thigh. She is taking Tylenol prn for pain and occasionally Aleve and more active  GERD: on Nexium, reviewed with patient long term use of PPI, but states lower dose does not control her symptoms, and she develops heartburn and regurgitation. Previous history of esophageal stricture and status post-dilation three times.   Headaches: dull aching pain usually frontal, occasionally temporal and sharp pain, intermittently and only takes medications prn. No photophobia or phonophobia  History of breast cancer: she still gets mammograms, no lumps or nipple discharge at this time  Patient Active Problem List   Diagnosis Date Noted  . Prediabetes 06/08/2016  . Obesity (BMI 30.0-34.9) 06/08/2016  . Vitamin D deficiency  06/08/2016  . Post-menopausal 12/11/2015  . DD (diverticular disease) 06/19/2015  . Edema extremities 06/19/2015  . Esophageal reflux 06/19/2015  . HLD (hyperlipidemia) 06/19/2015  . Varicose veins 06/19/2015  . Recurrent UTI 02/08/2013  . Mixed incontinence 02/08/2013  . History of breast cancer, left, T1, N0, Left MRM - Dec 1996 03/18/2012  . Tension headache 05/24/2007    Past Surgical History:  Procedure Laterality Date  . ABDOMINAL HYSTERECTOMY  2001  . COLONOSCOPY  04/02/2014  . CYSTOSCOPY  11/2014  . MASTECTOMY  1996   Left  . MELANOMA EXCISION  1995   Head    Family History  Problem Relation Age of Onset  . Breast cancer Mother   . Diabetes Father   . CAD Father   . Breast cancer      Niece  . Cirrhosis Brother     Social History   Social History  . Marital status: Single    Spouse name: N/A  . Number of children: 0  . Years of education: N/A   Occupational History  . Retired    Social History Main Topics  . Smoking status: Never Smoker  . Smokeless tobacco: Never Used  . Alcohol use No  . Drug use: No  . Sexual activity: Not Currently   Other Topics Concern  . Not on file   Social History Narrative  . No narrative on file     Current Outpatient Prescriptions:  .  aspirin EC 81 MG tablet, Take 1 tablet (  81 mg total) by mouth daily., Disp: 30 tablet, Rfl: 0 .  butalbital-acetaminophen-caffeine (FIORICET, ESGIC) 50-325-40 MG tablet, Take 1 tablet by mouth every 6 (six) hours as needed (as needed)., Disp: 20 tablet, Rfl: 0 .  Calcium-Magnesium 500-250 MG TABS, Take 2 tablets by mouth 2 (two) times daily., Disp: , Rfl:  .  Cholecalciferol (VITAMIN D3) 2000 units capsule, Take 1 capsule by mouth daily., Disp: , Rfl:  .  Cinnamon 500 MG capsule, Take 1 capsule by mouth daily., Disp: , Rfl:  .  Cranberry ([employer] CRANBERRY) 500 MG CAPS, Take 1 tablet by mouth daily., Disp: , Rfl:  .  esomeprazole (NEXIUM) 40 MG capsule, Take 1 capsule (40 mg total) by  mouth daily., Disp: 90 capsule, Rfl: 1 .  Multiple Vitamins-Calcium (ESSENTIAL ONE DAILY MULTIVIT) TABS, Take by mouth., Disp: , Rfl:  .  nitrofurantoin (MACRODANTIN) 100 MG capsule, Take 100 mg by mouth as needed.  , Disp: , Rfl:  .  Omega-3 1000 MG CAPS, Take 1 capsule by mouth 2 (two) times daily., Disp: , Rfl:   No Known Allergies   ROS  Constitutional: Negative for fever or weight change.  Respiratory: Negative for cough and shortness of breath.   Cardiovascular: Negative for chest pain or palpitations.  Gastrointestinal: Negative for abdominal pain, no bowel changes.  Musculoskeletal: Positive or gait problem and intermittent joint swelling.  Skin: Negative for rash.  Neurological: Negative for dizziness or headache.  No other specific complaints in a complete review of systems (except as listed in HPI above).  Objective  Vitals:   04/14/17 1030  BP: 116/64  Pulse: 81  Resp: 16  Temp: 97.9 F (36.6 C)  TempSrc: Oral  SpO2: 93%  Weight: 179 lb 3.2 oz (81.3 kg)  Height: 5\' 2"  (1.575 m)    Body mass index is 32.78 kg/m.  Physical Exam  Constitutional: Patient appears well-developed and well-nourished. Obese No distress.  HEENT: head atraumatic, normocephalic,  neck supple, throat within normal limits Cardiovascular: Normal rate, regular rhythm and normal heart sounds.  No murmur heard. 1 plus  BLE edema, very faint erythema of left lower leg, pending evaluation by Dr. Lucky Cowboy Pulmonary/Chest: Effort normal and breath sounds normal. No respiratory distress. Abdominal: Soft.  There is no tenderness. Psychiatric: Patient has a normal mood and affect. behavior is normal. Judgment and thought content normal. Muscular Skeletal: some kyphosis, decrease in rom of both hips but no pain, crepitus with extension of both knees, decrease extension and lateral bending but normal flexion and negative straight leg raise  PHQ2/9: Depression screen [facility] 2/9 04/14/2017 09/28/2016 06/08/2016  12/11/2015 10/21/2015  Decreased Interest 0 0 0 0 0  Down, Depressed, Hopeless 0 0 0 0 0  PHQ - 2 Score 0 0 0 0 0     Fall Risk: Fall Risk  04/14/2017 12/16/2016 09/28/2016 06/08/2016 12/11/2015  Falls in the past year? Yes Yes Yes No No  Number falls in past yr: 1 1 1  - -  Injury with Fall? No No No - -     Functional Status Survey: Is the patient deaf or have difficulty hearing?: No Does the patient have difficulty seeing, even when wearing glasses/contacts?: No Does the patient have difficulty concentrating, remembering, or making decisions?: No Does the patient have difficulty walking or climbing stairs?: No Does the patient have difficulty dressing or bathing?: No Does the patient have difficulty doing errands alone such as visiting a doctor's office or shopping?: No    Assessment &  Plan  1. Angina at rest [facility])  She sees Dr. Clayborn Bigness , had stress test in 05/2016  1. Lower extremity edema  Dr. Clayborn Bigness has placed a referral to Dr. Lucky Cowboy  2. Bilateral primary osteoarthritis of knee  stable  3. Other hyperlipidemia  Recheck labs next visit  4. Gastroesophageal reflux disease without esophagitis  Stable, states when she tries to go down on dose symptoms returns - esomeprazole (NEXIUM) 40 MG capsule; Take 1 capsule (40 mg total) by mouth daily.  Dispense: 90 capsule; Refill: 1  5. Recurrent UTI  Continue follow up with Dr. Jacqlyn Larsen  6. Hyperglycemia  Last hgbA1C was stable  7. History of breast cancer, left, T1, N0, Left MRM - Dec 1996  She will have mammogram this week   8. Mixed incontinence  Continue follow up with Dr. Jacqlyn Larsen  9. Tension headache  - butalbital-acetaminophen-caffeine (FIORICET, ESGIC) 50-325-40 MG tablet; Take 1 tablet by mouth every 6 (six) hours as needed (as needed).  Dispense: 20 tablet; Refill: 0  10. Right sciatic nerve pain  Had PT, she said she has intermittent pain

## 2017-04-16 ENCOUNTER — Ambulatory Visit
Admission: RE | Admit: 2017-04-16 | Discharge: 2017-04-16 | Disposition: A | Discharge status: Home / Self Care | Payer: Medicare Other | Source: Ambulatory Visit | Attending: Family Medicine | Admitting: Family Medicine

## 2017-04-16 DIAGNOSIS — Z1231 Encounter for screening mammogram for malignant neoplasm of breast: Secondary | ICD-10-CM

## 2017-04-27 ENCOUNTER — Ambulatory Visit (INDEPENDENT_AMBULATORY_CARE_PROVIDER_SITE_OTHER): Payer: Medicare Other | Admitting: Vascular Surgery

## 2017-04-27 ENCOUNTER — Encounter (INDEPENDENT_AMBULATORY_CARE_PROVIDER_SITE_OTHER): Payer: Self-pay | Admitting: Vascular Surgery

## 2017-04-27 VITALS — BP 140/66 | HR 62 | Resp 16 | Ht 61.5 in | Wt 178.0 lb

## 2017-04-27 DIAGNOSIS — E7849 Other hyperlipidemia: Secondary | ICD-10-CM

## 2017-04-27 DIAGNOSIS — E784 Other hyperlipidemia: Secondary | ICD-10-CM | POA: Diagnosis not present

## 2017-04-27 DIAGNOSIS — I83893 Varicose veins of bilateral lower extremities with other complications: Secondary | ICD-10-CM | POA: Diagnosis not present

## 2017-04-27 DIAGNOSIS — M7989 Other specified soft tissue disorders: Secondary | ICD-10-CM | POA: Insufficient documentation

## 2017-04-27 NOTE — Progress Notes (Signed)
MRN : [id number]  Christina Salas is a 83 y.o. (28-Nov-1933) female who presents with chief complaint of  Chief Complaint  Patient presents with  . New Patient (Initial Visit)  .  History of Present Illness:  I am asked to see the patient by Dr. Clayborn Bigness for evaluation of leg pain and swelling.  The patient reports For the past year or so, she has had prominent swelling that is causing discomfort and heaviness in the legs pre-much every day. This is worse on the left than the right leg. She has had limited improvement with recent initiation of the zipper compression stockings and increasing her activity with physical therapy. She has some swelling in the right leg as well although not as severe as the left. She denies previous history of DVT or superficial thrombophlebitis to her knowledge. She has a niece with lymphedema and is familiar with this disease process. She denies ulceration or infection. She has no fever or chills. There is no clear inciting event or causative factor that started the swelling. She says she may have had this many years ago but was reasonably mild and only in the last year or 2 has become noticeable. The progression has been gradual. The swelling affects largely the lower leg and ankle area particularly on the left leg.   Current Outpatient Prescriptions  Medication Sig Dispense Refill  . aspirin EC 81 MG tablet Take 1 tablet (81 mg total) by mouth daily. 30 tablet 0  . butalbital-acetaminophen-caffeine (FIORICET, ESGIC) 50-325-40 MG tablet Take 1 tablet by mouth every 6 (six) hours as needed (as needed). 20 tablet 0  . Calcium-Magnesium 500-250 MG TABS Take 2 tablets by mouth 2 (two) times daily.    . Cholecalciferol (VITAMIN D3) 2000 units capsule Take 1 capsule by mouth daily.    . Cinnamon 500 MG capsule Take 1 capsule by mouth daily.    . Cranberry ([employer] CRANBERRY) 500 MG CAPS Take 1 tablet by mouth daily.    [name] esomeprazole (NEXIUM) 40 MG capsule Take 1  capsule (40 mg total) by mouth daily. 90 capsule 1  . Multiple Vitamins-Calcium (ESSENTIAL ONE DAILY MULTIVIT) TABS Take by mouth.    . nitrofurantoin (MACRODANTIN) 100 MG capsule Take 100 mg by mouth as needed.      . Omega-3 1000 MG CAPS Take 1 capsule by mouth 2 (two) times daily.     No current facility-administered medications for this visit.     Past Medical History:  Diagnosis Date  . Abnormal glucose   . Arthritis   . Breast cancer ([place])   . Cancer ([place])   . Chronic headaches   . GERD (gastroesophageal reflux disease)   . H/O melanoma excision   . History of diverticulitis   . History of recurrent UTIs   . Osteoporosis    pre-osetoporosis per patient history form 03/18/12  . Vitamin D deficiency     Past Surgical History:  Procedure Laterality Date  . ABDOMINAL HYSTERECTOMY  2001  . BREAST BIOPSY  2010  . BREAST EXCISIONAL BIOPSY Right 1997   Benign excisional biopsy   . COLONOSCOPY  04/02/2014  . CYSTOSCOPY  11/2014  . MASTECTOMY  1996   Left  . [place]   Head    Social History Social History  Substance Use Topics  . Smoking status: Never Smoker  . Smokeless tobacco: Never Used  . Alcohol use No  No IV drug use  Family History Family History  Problem Relation Age of Onset  . Breast cancer Mother   . Diabetes Father   . CAD Father   . Breast cancer      Niece  . Cirrhosis Brother   No bleeding or clotting disorders  No Known Allergies   REVIEW OF SYSTEMS (Negative unless checked)  Constitutional: [] Weight loss  [] Fever  [] Chills Cardiac: [] Chest pain   [] Chest pressure   [] Palpitations   [] Shortness of breath when laying flat   [] Shortness of breath at rest   [x] Shortness of breath with exertion. Vascular:  [x] Pain in legs with walking   [x] Pain in legs at rest   [] Pain in legs when laying flat   [] Claudication   [] Pain in feet when walking  [] Pain in feet at rest  [] Pain in feet when laying flat   [] History of DVT   [] Phlebitis    [x] Swelling in legs   [x] Varicose veins   [] Non-healing ulcers Pulmonary:   [] Uses home oxygen   [] Productive cough   [] Hemoptysis   [] Wheeze  [] COPD   [] Asthma Neurologic:  [] Dizziness  [] Blackouts   [] Seizures   [] History of stroke   [] History of TIA  [] Aphasia   [] Temporary blindness   [] Dysphagia   [] Weakness or numbness in arms   [] Weakness or numbness in legs Musculoskeletal:  [x] Arthritis   [] Joint swelling   [] Joint pain   [] Low back pain Hematologic:  [] Easy bruising  [] Easy bleeding   [] Hypercoagulable state   [] Anemic  [] Hepatitis Gastrointestinal:  [] Blood in stool   [] Vomiting blood  [] Gastroesophageal reflux/heartburn   [] Difficulty swallowing. Genitourinary:  [] Chronic kidney disease   [] Difficult urination  [] Frequent urination  [] Burning with urination   [] Blood in urine Skin:  [] Rashes   [] Ulcers   [] Wounds Psychological:  [] History of anxiety   []  History of major depression.  Physical Examination  Vitals:   04/27/17 1025  BP: 140/66  Pulse: 62  Resp: 16  Weight: 178 lb (80.7 kg)  Height: 5' 1.5" (1.562 m)   Body mass index is 33.09 kg/m. Gen:  WD/WN, NAD. Appears younger than stated age Head: [place]/AT, No temporalis wasting. Ear/Nose/Throat: Hearing grossly intact, nares w/o erythema or drainage, trachea midline Eyes: Conjunctiva clear. Sclera non-icteric Neck: Supple.  No JVD.  Pulmonary:  Good air movement, no use of accessory muscles, respirations not labored Cardiac: RRR Vascular: Scattered varicosities bilaterally, a little more on the left than the right Vessel Right Left  Radial Palpable Palpable  Ulnar Palpable Palpable  Brachial Palpable Palpable  Carotid Palpable, without bruit Palpable, without bruit  Aorta Not palpable N/A  Femoral Palpable Palpable  Popliteal Palpable Palpable  PT 1+ Palpable Not Palpable  DP Palpable Palpable   Gastrointestinal: soft, non-tender/non-distended. Musculoskeletal: M/S 5/5 throughout.  No deformity or atrophy. 1+  right lower extremity edema, 2+ left lower extremity edema. Neurologic: CN 2-12 intact. Sensation grossly intact in extremities.  Symmetrical.  Speech is fluent. Motor exam as listed above. Psychiatric: Judgment intact, Mood & affect appropriate for pt's clinical situation. Dermatologic: No rashes or ulcers noted.  No cellulitis or open wounds. Lymph : No Cervical, Axillary, or Inguinal lymphadenopathy.     CBC Lab Results  Component Value Date   WBC 5.9 06/19/2016   HGB 12.9 01/01/2015   HCT 37.0 06/19/2016   MCV 82 06/19/2016   PLT 228 06/19/2016    BMET    Component Value Date/Time   NA 142 06/19/2016 0744   NA 143 02/24/2013 0639   K 4.1 06/19/2016  0744   K 3.8 02/24/2013 0639   CL 104 06/19/2016 0744   CL 110 (H) 02/24/2013 0639   CO2 19 06/19/2016 0744   CO2 27 02/24/2013 0639   GLUCOSE 99 06/19/2016 0744   GLUCOSE 90 02/24/2013 0639   BUN 27 06/19/2016 0744   BUN 11 02/24/2013 0639   CREATININE 0.92 06/19/2016 0744   CREATININE 0.73 02/24/2013 0639   CALCIUM 9.4 06/19/2016 0744   CALCIUM 8.3 (L) 02/24/2013 0639   GFRNONAA 59 (L) 06/19/2016 0744   GFRNONAA >60 02/24/2013 0639   GFRAA 68 06/19/2016 0744   GFRAA >60 02/24/2013 0639   CrCl cannot be calculated (Patient's most recent lab result is older than the maximum 21 days allowed.).  COAG Lab Results  Component Value Date   INR 1.1 02/23/2013    Radiology Mm Screening Breast Tomo Uni R  Result Date: 04/16/2017 CLINICAL DATA:  Screening. History of left breast cancer status post mastectomy in 1996. History of benign right breast biopsy in 2010. EXAM: 2D DIGITAL SCREENING UNILATERAL RIGHT MAMMOGRAM WITH CAD AND ADJUNCT TOMO COMPARISON:  Previous exam(s). ACR Breast Density Category b: There are scattered areas of fibroglandular density. FINDINGS: Biopsy site marker within the right breast is stable in position. There are no findings suspicious for malignancy within either breast. Images were processed with  CAD. IMPRESSION: No mammographic evidence of malignancy. A result letter of this screening mammogram will be mailed directly to the patient. RECOMMENDATION: Screening mammogram in one year. (Code:SM-B-01Y) BI-RADS CATEGORY  2: Benign. Electronically Signed   By: Franki Cabot M.D.   On: 04/16/2017 13:43     Assessment/Plan HLD (hyperlipidemia) lipid control important in reducing the progression of atherosclerotic disease.    Varicose veins of leg with swelling, bilateral see treatment plan as below.  Swelling of limb I have had a long discussion with the patient regarding swelling and why it  causes symptoms.  Patient will begin wearing graduated compression stockings class 1 (20-30 mmHg) on a daily basis a prescription was given. The patient will  beginning wearing the stockings first thing in the morning and removing them in the evening. The patient is instructed specifically not to sleep in the stockings.   In addition, behavioral modification will be initiated.  This will include frequent elevation, use of over the counter pain medications and exercise such as walking.  I have reviewed systemic causes for chronic edema such as liver, kidney and cardiac etiologies.  The patient denies problems with these organ systems.    Consideration for a lymph pump will also be made based upon the effectiveness of conservative therapy.  This would help to improve the edema control and prevent sequela such as ulcers and infections   Patient should undergo duplex ultrasound of the venous system to ensure that DVT or reflux is not present.  The patient will follow-up with me after the ultrasound.      Leotis Pain, MD  04/27/2017 11:24 AM    This note was created with Dragon medical transcription system.  Any errors from dictation are purely unintentional

## 2017-04-27 NOTE — Assessment & Plan Note (Signed)
lipid control important in reducing the progression of atherosclerotic disease.   

## 2017-04-27 NOTE — Assessment & Plan Note (Signed)
see treatment plan as below.

## 2017-04-27 NOTE — Assessment & Plan Note (Signed)

## 2017-04-27 NOTE — Patient Instructions (Signed)
Edema Edema is an abnormal buildup of fluids in your bodytissues. Edema is somewhatdependent on gravity to pull the fluid to the lowest place in your body. That makes the condition more common in the legs and thighs (lower extremities). Painless swelling of the feet and ankles is common and becomes more likely as you get older. It is also common in looser tissues, like around your eyes. When the affected area is squeezed, the fluid may move out of that spot and leave a dent for a few moments. This dent is called pitting. What are the causes? There are many possible causes of edema. Eating too much salt and being on your feet or sitting for a long time can cause edema in your legs and ankles. Hot weather may make edema worse. Common medical causes of edema include:  Heart failure.  Liver disease.  Kidney disease.  Weak blood vessels in your legs.  Cancer.  An injury.  Pregnancy.  Some medications.  Obesity.  What are the signs or symptoms? Edema is usually painless.Your skin may look swollen or shiny. How is this diagnosed? Your health care provider may be able to diagnose edema by asking about your medical history and doing a physical exam. You may need to have tests such as X-rays, an electrocardiogram, or blood tests to check for medical conditions that may cause edema. How is this treated? Edema treatment depends on the cause. If you have heart, liver, or kidney disease, you need the treatment appropriate for these conditions. General treatment may include:  Elevation of the affected body part above the level of your heart.  Compression of the affected body part. Pressure from elastic bandages or support stockings squeezes the tissues and forces fluid back into the blood vessels. This keeps fluid from entering the tissues.  Restriction of fluid and salt intake.  Use of a water pill (diuretic). These medications are appropriate only for some types of edema. They pull fluid  out of your body and make you urinate more often. This gets rid of fluid and reduces swelling, but diuretics can have side effects. Only use diuretics as directed by your health care provider.  Follow these instructions at home:  Keep the affected body part above the level of your heart when you are lying down.  Do not sit still or stand for prolonged periods.  Do not put anything directly under your knees when lying down.  Do not wear constricting clothing or garters on your upper legs.  Exercise your legs to work the fluid back into your blood vessels. This may help the swelling go down.  Wear elastic bandages or support stockings to reduce ankle swelling as directed by your health care provider.  Eat a low-salt diet to reduce fluid if your health care provider recommends it.  Only take medicines as directed by your health care provider. Contact a health care provider if:  Your edema is not responding to treatment.  You have heart, liver, or kidney disease and notice symptoms of edema.  You have edema in your legs that does not improve after elevating them.  You have sudden and unexplained weight gain. Get help right away if:  You develop shortness of breath or chest pain.  You cannot breathe when you lie down.  You develop pain, redness, or warmth in the swollen areas.  You have heart, liver, or kidney disease and suddenly get edema.  You have a fever and your symptoms suddenly get worse. This information is   not intended to replace advice given to you by your health care provider. Make sure you discuss any questions you have with your health care provider. Document Released: 12/14/2005 Document Revised: 05/21/2016 Document Reviewed: 10/06/2013 Elsevier Interactive Patient Education  2017 [employer] Inc.  

## 2017-07-13 ENCOUNTER — Encounter (INDEPENDENT_AMBULATORY_CARE_PROVIDER_SITE_OTHER): Payer: Self-pay | Admitting: Vascular Surgery

## 2017-07-13 ENCOUNTER — Ambulatory Visit (INDEPENDENT_AMBULATORY_CARE_PROVIDER_SITE_OTHER): Payer: Medicare Other

## 2017-07-13 ENCOUNTER — Ambulatory Visit (INDEPENDENT_AMBULATORY_CARE_PROVIDER_SITE_OTHER): Payer: Medicare Other | Admitting: Vascular Surgery

## 2017-07-13 VITALS — BP 135/56 | HR 68 | Resp 15 | Ht 64.5 in | Wt 176.0 lb

## 2017-07-13 DIAGNOSIS — I83893 Varicose veins of bilateral lower extremities with other complications: Secondary | ICD-10-CM

## 2017-07-13 DIAGNOSIS — E7849 Other hyperlipidemia: Secondary | ICD-10-CM

## 2017-07-13 DIAGNOSIS — E784 Other hyperlipidemia: Secondary | ICD-10-CM | POA: Diagnosis not present

## 2017-07-13 DIAGNOSIS — M7989 Other specified soft tissue disorders: Secondary | ICD-10-CM

## 2017-07-13 NOTE — Assessment & Plan Note (Signed)
Her venous duplex today shows no evidence of deep venous thrombosis or superficial thrombophlebitis. She has venous reflux in both deep venous systems as well as in both great saphenous vein and small saphenous veins. So far, she has had a positive response to conservative therapy alone. With this, I would not recommend any intervention at this point. I will plan to see her back in about 6 months in follow-up. She have any worsening symptoms she should call my office in the interim.

## 2017-07-13 NOTE — Patient Instructions (Signed)

## 2017-07-13 NOTE — Progress Notes (Signed)
MRN : [id number]  Christina Salas is a 83 y.o. (09/21/1933) female who presents with chief complaint of  Chief Complaint  Patient presents with  . Re-evaluation    Reflux u/s  .  History of Present Illness: Patient returns today in follow up of leg swelling and pain. Her legs feel a fair bit better than they did when she was last seen a couple of months ago. She denies any fever or chills. No ulceration or infection. She reports not having been wearing her compression stockings for the last couple of weeks, but prior to that she had been wearing them regularly. Her venous duplex today shows no evidence of deep venous thrombosis or superficial thrombophlebitis. She has venous reflux in both deep venous systems as well as in both great saphenous vein and small saphenous veins.  Current Outpatient Prescriptions  Medication Sig Dispense Refill  . aspirin EC 81 MG tablet Take 1 tablet (81 mg total) by mouth daily. 30 tablet 0  . butalbital-acetaminophen-caffeine (FIORICET, ESGIC) 50-325-40 MG tablet Take 1 tablet by mouth every 6 (six) hours as needed (as needed). 20 tablet 0  . Calcium-Magnesium 500-250 MG TABS Take 2 tablets by mouth 2 (two) times daily.    . Cholecalciferol (VITAMIN D3) 2000 units capsule Take 1 capsule by mouth daily.    . Cinnamon 500 MG capsule Take 1 capsule by mouth daily.    . Cranberry ([employer] CRANBERRY) 500 MG CAPS Take 1 tablet by mouth daily.    [name] esomeprazole (NEXIUM) 40 MG capsule Take 1 capsule (40 mg total) by mouth daily. 90 capsule 1  . Multiple Vitamins-Calcium (ESSENTIAL ONE DAILY MULTIVIT) TABS Take by mouth.    . nitrofurantoin (MACRODANTIN) 100 MG capsule Take 100 mg by mouth as needed.      . Omega-3 1000 MG CAPS Take 1 capsule by mouth 2 (two) times daily.     No current facility-administered medications for this visit.         Past Medical History:  Diagnosis Date  . Abnormal glucose   . Arthritis   . Breast cancer ([place])    . Cancer ([place])   . Chronic headaches   . GERD (gastroesophageal reflux disease)   . H/O melanoma excision   . History of diverticulitis   . History of recurrent UTIs   . Osteoporosis    pre-osetoporosis per patient history form 03/18/12  . Vitamin D deficiency          Past Surgical History:  Procedure Laterality Date  . ABDOMINAL HYSTERECTOMY  2001  . BREAST BIOPSY  2010  . BREAST EXCISIONAL BIOPSY Right 1997   Benign excisional biopsy   . COLONOSCOPY  04/02/2014  . CYSTOSCOPY  11/2014  . MASTECTOMY  1996   Left  . [place]   Head    Social History     Social History  Substance Use Topics  . Smoking status: Never Smoker  . Smokeless tobacco: Never Used  . Alcohol use No  No IV drug use  Family History       Family History  Problem Relation Age of Onset  . Breast cancer Mother   . Diabetes Father   . CAD Father   . Breast cancer      Niece  . Cirrhosis Brother   No bleeding or clotting disorders  No Known Allergies   REVIEW OF SYSTEMS (Negative unless checked)  Constitutional: [] Weight loss  [] Fever  [] Chills Cardiac: [] Chest  pain   [] Chest pressure   [] Palpitations   [] Shortness of breath when laying flat   [] Shortness of breath at rest   [x] Shortness of breath with exertion. Vascular:  [x] Pain in legs with walking   [x] Pain in legs at rest   [] Pain in legs when laying flat   [] Claudication   [] Pain in feet when walking  [] Pain in feet at rest  [] Pain in feet when laying flat   [] History of DVT   [] Phlebitis   [x] Swelling in legs   [x] Varicose veins   [] Non-healing ulcers Pulmonary:   [] Uses home oxygen   [] Productive cough   [] Hemoptysis   [] Wheeze  [] COPD   [] Asthma Neurologic:  [] Dizziness  [] Blackouts   [] Seizures   [] History of stroke   [] History of TIA  [] Aphasia   [] Temporary blindness   [] Dysphagia   [] Weakness or numbness in arms   [] Weakness or numbness in legs Musculoskeletal:  [x] Arthritis    [] Joint swelling   [] Joint pain   [] Low back pain Hematologic:  [] Easy bruising  [] Easy bleeding   [] Hypercoagulable state   [] Anemic  [] Hepatitis Gastrointestinal:  [] Blood in stool   [] Vomiting blood  [] Gastroesophageal reflux/heartburn   [] Difficulty swallowing. Genitourinary:  [] Chronic kidney disease   [] Difficult urination  [] Frequent urination  [] Burning with urination   [] Blood in urine Skin:  [] Rashes   [] Ulcers   [] Wounds Psychological   Physical Examination  BP (!) 135/56 (BP Location: Right Arm)   Pulse 68   Resp 15   Ht 5' 4.5" (1.638 m)   Wt 176 lb (79.8 kg)   BMI 29.74 kg/m  Gen:  WD/WN, NAD. Appears younger than stated age. Head: [place]/AT, No temporalis wasting. Ear/Nose/Throat: Hearing grossly intact, nares w/o erythema or drainage, trachea midline Eyes: Conjunctiva clear. Sclera non-icteric Neck: Supple.  No JVD.  Pulmonary:  Good air movement, no use of accessory muscles.  Cardiac: RRR, normal S1, S2 Vascular:  Vessel Right Left  Radial Palpable Palpable                                    Musculoskeletal: M/S 5/5 throughout.  No deformity or atrophy. 1-2+ BLE edema. Neurologic: Sensation grossly intact in extremities.  Symmetrical.  Speech is fluent.  Psychiatric: Judgment intact, Mood & affect appropriate for pt's clinical situation. Dermatologic: No rashes or ulcers noted.  No cellulitis or open wounds.       Labs No results found for this or any previous visit (from the past 2160 hour(s)).  Radiology No results found.  Assessment/Plan HLD (hyperlipidemia) lipid control important in reducing the progression of atherosclerotic disease.   Varicose veins of leg with swelling, bilateral Her venous duplex today shows no evidence of deep venous thrombosis or superficial thrombophlebitis. She has venous reflux in both deep venous systems as well as in both great saphenous vein and small saphenous veins. So far, she has had a positive response to  conservative therapy alone. With this, I would not recommend any intervention at this point. I will plan to see her back in about 6 months in follow-up. She have any worsening symptoms she should call my office in the interim.    Leotis Pain, MD  07/13/2017 3:58 PM    This note was created with Dragon medical transcription system.  Any errors from dictation are purely unintentional

## 2017-10-25 ENCOUNTER — Encounter: Payer: Self-pay | Admitting: Family Medicine

## 2017-10-25 ENCOUNTER — Ambulatory Visit (INDEPENDENT_AMBULATORY_CARE_PROVIDER_SITE_OTHER): Payer: Medicare Other | Admitting: Family Medicine

## 2017-10-25 VITALS — BP 120/62 | HR 71 | Temp 97.7°F | Resp 14 | Ht 62.0 in | Wt 175.9 lb

## 2017-10-25 DIAGNOSIS — Z0000 Encounter for general adult medical examination without abnormal findings: Secondary | ICD-10-CM

## 2017-10-25 DIAGNOSIS — Z9012 Acquired absence of left breast and nipple: Secondary | ICD-10-CM

## 2017-10-25 DIAGNOSIS — E559 Vitamin D deficiency, unspecified: Secondary | ICD-10-CM

## 2017-10-25 DIAGNOSIS — Z85820 Personal history of malignant melanoma of skin: Secondary | ICD-10-CM

## 2017-10-25 DIAGNOSIS — N3946 Mixed incontinence: Secondary | ICD-10-CM

## 2017-10-25 DIAGNOSIS — Z23 Encounter for immunization: Secondary | ICD-10-CM | POA: Diagnosis not present

## 2017-10-25 DIAGNOSIS — R198 Other specified symptoms and signs involving the digestive system and abdomen: Secondary | ICD-10-CM | POA: Diagnosis not present

## 2017-10-25 DIAGNOSIS — K219 Gastro-esophageal reflux disease without esophagitis: Secondary | ICD-10-CM | POA: Diagnosis not present

## 2017-10-25 DIAGNOSIS — R600 Localized edema: Secondary | ICD-10-CM | POA: Diagnosis not present

## 2017-10-25 DIAGNOSIS — E2839 Other primary ovarian failure: Secondary | ICD-10-CM

## 2017-10-25 DIAGNOSIS — G44209 Tension-type headache, unspecified, not intractable: Secondary | ICD-10-CM | POA: Diagnosis not present

## 2017-10-25 DIAGNOSIS — M170 Bilateral primary osteoarthritis of knee: Secondary | ICD-10-CM | POA: Diagnosis not present

## 2017-10-25 DIAGNOSIS — Z853 Personal history of malignant neoplasm of breast: Secondary | ICD-10-CM

## 2017-10-25 DIAGNOSIS — I208 Other forms of angina pectoris: Secondary | ICD-10-CM | POA: Diagnosis not present

## 2017-10-25 DIAGNOSIS — R739 Hyperglycemia, unspecified: Secondary | ICD-10-CM

## 2017-10-25 MED ORDER — BUTALBITAL-APAP-CAFFEINE 50-325-40 MG PO TABS: 1.0000 | ORAL_TABLET | Freq: Four times a day (QID) | ORAL | 0 refills | Status: DC | PRN

## 2017-10-25 MED ORDER — ESOMEPRAZOLE MAGNESIUM 40 MG PO CPDR: 40.0000 mg | DELAYED_RELEASE_CAPSULE | Freq: Every day | ORAL | 1 refills | Status: DC

## 2017-10-25 NOTE — Patient Instructions (Signed)
Preventive Care 65 Years and Older, Female Preventive care refers to lifestyle choices and visits with your health care provider that can promote health and wellness. What does preventive care include?  A yearly physical exam. This is also called an annual well check.  Dental exams once or twice a year.  Routine eye exams. Ask your health care provider how often you should have your eyes checked.  Personal lifestyle choices, including: ? Daily care of your teeth and gums. ? Regular physical activity. ? Eating a healthy diet. ? Avoiding tobacco and drug use. ? Limiting alcohol use. ? Practicing safe sex. ? Taking low-dose aspirin every day. ? Taking vitamin and mineral supplements as recommended by your health care provider. What happens during an annual well check? The services and screenings done by your health care provider during your annual well check will depend on your age, overall health, lifestyle risk factors, and family history of disease. Counseling Your health care provider may ask you questions about your:  Alcohol use.  Tobacco use.  Drug use.  Emotional well-being.  Home and relationship well-being.  Sexual activity.  Eating habits.  History of falls.  Memory and ability to understand (cognition).  Work and work environment.  Reproductive health.  Screening You may have the following tests or measurements:  Height, weight, and BMI.  Blood pressure.  Lipid and cholesterol levels. These may be checked every 5 years, or more frequently if you are over 50 years old.  Skin check.  Lung cancer screening. You may have this screening every year starting at age 55 if you have a 30-pack-year history of smoking and currently smoke or have quit within the past 15 years.  Fecal occult blood test (FOBT) of the stool. You may have this test every year starting at age 50.  Flexible sigmoidoscopy or colonoscopy. You may have a sigmoidoscopy every 5 years or  a colonoscopy every 10 years starting at age 50.  Hepatitis C blood test.  Hepatitis B blood test.  Sexually transmitted disease (STD) testing.  Diabetes screening. This is done by checking your blood sugar (glucose) after you have not eaten for a while (fasting). You may have this done every 1-3 years.  Bone density scan. This is done to screen for osteoporosis. You may have this done starting at age 65.  Mammogram. This may be done every 1-2 years. Talk to your health care provider about how often you should have regular mammograms.  Talk with your health care provider about your test results, treatment options, and if necessary, the need for more tests. Vaccines Your health care provider may recommend certain vaccines, such as:  Influenza vaccine. This is recommended every year.  Tetanus, diphtheria, and acellular pertussis (Tdap, Td) vaccine. You may need a Td booster every 10 years.  Varicella vaccine. You may need this if you have not been vaccinated.  Zoster vaccine. You may need this after age 60.  Measles, mumps, and rubella (MMR) vaccine. You may need at least one dose of MMR if you were born in 1957 or later. You may also need a second dose.  Pneumococcal 13-valent conjugate (PCV13) vaccine. One dose is recommended after age 65.  Pneumococcal polysaccharide (PPSV23) vaccine. One dose is recommended after age 65.  Meningococcal vaccine. You may need this if you have certain conditions.  Hepatitis A vaccine. You may need this if you have certain conditions or if you travel or work in places where you may be exposed to hepatitis   A.  Hepatitis B vaccine. You may need this if you have certain conditions or if you travel or work in places where you may be exposed to hepatitis B.  Haemophilus influenzae type b (Hib) vaccine. You may need this if you have certain conditions.  Talk to your health care provider about which screenings and vaccines you need and how often you  need them. This information is not intended to replace advice given to you by your health care provider. Make sure you discuss any questions you have with your health care provider. Document Released: 01/10/2016 Document Revised: 09/02/2016 Document Reviewed: 10/15/2015 Elsevier Interactive Patient Education  2017 [employer].

## 2017-10-25 NOTE — Progress Notes (Signed)
Patient: Christina Salas, Female    DOB: 1933-09-01, 83 y.o.   MRN: [id number]  Visit Date: 10/25/2017  Today's Provider: Loistine Chance, MD   Chief Complaint  Patient presents with  . Medicare Wellness  . Hyperlipidemia  . Gastroesophageal Reflux    Subjective:    HPI Christina Salas is a 83 y.o. female who presents today for her Subsequent Annual Wellness Visit.  Patient/Caregiver input:  Change in bowel movements, regular follow up  Change in bowel movements: she states she states bowel movements never been daily but in the past no pain, however over the past few months has noticed increase in frequency of bowel movements, stool bristol scale 4-5 and also some cramping prior to bowel movements, no blood or mucus in stools. No change in appetite or weight. She is trying to eat one good meal a day. Advised to see GI but she would like to hold off , she states not bothering her enough, she will call back if she changes her mind. She will have hemoccult stools done at home  Angina at rest: she states symptoms were worse Spring of 2017  went to Dr. Clayborn Bigness and had a normal stress test. She states her heart gurgles intermittently, she has SOB with mild/moderate activity. She is doing better now, on aspirin daily and fish oil. Not in statin therapy. She prefers not starting statin  Pre-diabetes: she denies polyphagia, polydipsia, she states no change in urinary frequency. We will recheck labs today   Edema extremities: she is trying to walk more, wearing compression stocking hoses, but sometimes does not seem to help with swelling. Denies claudication , see by Dr. Lucky Cowboy.   GERD: on Nexium, reviewed with patient long term use of PPI, but states lower dose does not control her symptoms. Previous history of esophageal stricture and status post-dilation three times.   Headaches: dull aching pain usually frontal, occasionally temporal and sharp pain, intermittently and only takes  medications prn. No photophobia or phonophobia  History of breast cancer: she still gets mammograms, no lumps or nipple discharge at this time, [facility] [place]  Constitutional: Negative for fever or weight change.  Respiratory: Negative for cough positive for  shortness of breath with activity .   Cardiovascular: Negative for chest pain or palpitations.  Gastrointestinal: Negative for abdominal pain, positive for  bowel changes - over the past few months.  Musculoskeletal: Positive  for gait problem ( But doing better since PT) no  joint swelling.  Skin: Negative for rash.  Neurological: Negative for dizziness , positive for intermittent  headache.  No other specific complaints in a complete review of systems (except as listed in HPI above).  Past Medical History:  Diagnosis Date  . Abnormal glucose   . Arthritis   . Breast cancer ([place])   . Cancer ([place])   . Chronic headaches   . GERD (gastroesophageal reflux disease)   . H/O melanoma excision   . History of diverticulitis   . History of recurrent UTIs   . Osteoporosis    pre-osetoporosis per patient history form 03/18/12  . Vitamin D deficiency     Past Surgical History:  Procedure Laterality Date  . ABDOMINAL HYSTERECTOMY  2001  . BREAST BIOPSY  2010  . BREAST EXCISIONAL BIOPSY Right 1997   Benign excisional biopsy   . COLONOSCOPY  04/02/2014  . CYSTOSCOPY  11/2014  . MASTECTOMY  1996   Left  .  MELANOMA EXCISION  1995   Head    Family History  Problem Relation Age of Onset  . Breast cancer Mother   . Diabetes Father   . CAD Father   . Breast cancer Unknown        Niece  . Cirrhosis Brother     Social History   Social History  . Marital status: Single    Spouse name: N/A  . Number of children: 0  . Years of education: N/A   Occupational History  . Retired    Social History Main Topics  . Smoking status: Never Smoker  . Smokeless tobacco: Never Used   . Alcohol use No  . Drug use: No  . Sexual activity: Not Currently   Other Topics Concern  . Not on file   Social History Narrative   She lives by herself   She has nieces and nephews in the area   She was a Pharmacist, hospital for over 7 years, retired in [place] Encounter Prescriptions as of 10/25/2017  Medication Sig Note  . aspirin EC 81 MG tablet Take 1 tablet (81 mg total) by mouth daily.   . butalbital-acetaminophen-caffeine (FIORICET, ESGIC) 50-325-40 MG tablet Take 1 tablet by mouth every 6 (six) hours as needed (as needed).   . Calcium-Magnesium 500-250 MG TABS Take 2 tablets by mouth 2 (two) times daily.   . Cholecalciferol (VITAMIN D3) 2000 units capsule Take 1 capsule by mouth daily.   . Cinnamon 500 MG capsule Take 1 capsule by mouth daily. 06/19/2015: Received from: [facility]: Take by mouth.  . Cranberry ([employer] CRANBERRY) 500 MG CAPS Take 1 tablet by mouth daily. 06/19/2015: Received from: [place]: Take by mouth.  . esomeprazole (NEXIUM) 40 MG capsule Take 1 capsule (40 mg total) by mouth daily.   . Multiple Vitamins-Calcium (ESSENTIAL ONE DAILY MULTIVIT) TABS Take by mouth.   . nitrofurantoin (MACRODANTIN) 100 MG capsule Take 100 mg by mouth as needed.     . Omega-3 1000 MG CAPS Take 1 capsule by mouth 2 (two) times daily.   . [DISCONTINUED] butalbital-acetaminophen-caffeine (FIORICET, ESGIC) 50-325-40 MG tablet Take 1 tablet by mouth every 6 (six) hours as needed (as needed).   . [DISCONTINUED] esomeprazole (NEXIUM) 40 MG capsule Take 1 capsule (40 mg total) by mouth daily.   . Coenzyme Q10 (COQ10) 30 MG CAPS Take 1 capsule by mouth every other day.    No facility-administered encounter medications on file as of 10/25/2017.     No Known Allergies  Care Team Updated in EHR: Yes  Last Vision Exam: Wears corrective lenses: Yes, last week in [place] - she has cataract Last Dental Exam: every 6 months Last Hearing Exam: 2018   Wears Hearing Aids: Yes  Functional Ability / Safety Screening 1.  Was the timed Get Up and Go test shorter than 30 seconds?  yes 2.  Does the patient need help with the phone, transportation, shopping,      preparing meals, housework, laundry, medications, or managing money?  yes 3.  Is the patient's home free of loose throw rugs in walkways, pet beds, electrical cords, etc?   yes      Grab bars in the bathroom? yes      Handrails on the stairs?   yes      Adequate lighting?   yes 4.  Has the patient noticed any hearing difficulties?   yes - waiting for hearing aid  Diet Recall and Exercise Regimen:  Current Exercise Habits: The patient does not participate in regular exercise at present Exercise limited by: orthopedic condition(s)  She eats out a lot. Discussed increase fruit and vegetables.   Advanced Care Planning: A voluntary discussion about advance care planning including the explanation and discussion of advance directives.  Discussed health care proxy and Living will, and the patient was able to identify a health care proxy as sister or her niece Rogue Jury)   Patient does not have a living will at present time. If patient does have living will, I have requested they bring this to the clinic to be scanned in to their chart. Does patient have a HCPOA?    no If yes, name and contact information:  Does patient have a living will or MOST form?  no  Cancer Screenings:  Lung:  Low Dose CT Chest recommended if Age 65-80 years, 30 pack-year currently smoking OR have quit w/in 15years. Patient does not qualify. Breast:  Up to date on Mammogram? Yes  Up to date of Bone Density/Dexa? No Colon: aged out but discuss recheck because change of bowel movements     Objective:   Vitals: BP 120/62 (BP Location: Right Arm, Patient Position: Sitting, Cuff Size: Normal)   Pulse 71   Temp 97.7 F (36.5 C) (Oral)   Resp 14   Ht 5\' 2"  (1.575 m)   Wt 175 lb 14.4 oz (79.8 kg)   SpO2 97%    BMI 32.17 kg/m  Body mass index is 32.17 kg/m.  No exam data present  Physical Exam Constitutional: Patient appears well-developed and well-nourished. Obese  No distress.  HEENT: head atraumatic, normocephalic, pupils equal and reactive to light,  neck supple, throat within normal limits Cardiovascular: Normal rate, regular rhythm and normal heart sounds.  No murmur heard. No BLE edema. Pulmonary/Chest: Effort normal and breath sounds normal. No respiratory distress. Breast left mastectomy, normal right breast Abdominal: Soft.  There is no tenderness. Psychiatric: Patient has a normal mood and affect. behavior is normal. Judgment and thought content normal.  Cognitive Testing - 6-CIT  Correct? Score   What year is it? yes 0 Yes = 0    No = 4  What month is it? yes 0 Yes = 0    No = 3  Remember:     Pia Mau, [place], [place]     What time is it? yes 0 Yes = 0    No = 3  Count backwards from 20 to 1 yes 0 Correct = 0    1 error = 2   More than 1 error = 4  Say the months of the year in reverse. yes 0 Correct = 0    1 error = 2   More than 1 error = 4  What address did I ask you to remember? yes 0 Correct = 0  1 error = 2    2 error = 4    3 error = 6    4 error = 8    All wrong = 10       TOTAL SCORE  0/28   Interpretation:  Normal  Normal (0-7) Abnormal (8-28)   Fall Risk: Fall Risk  10/25/2017 04/14/2017 12/16/2016 09/28/2016 06/08/2016  Falls in the past year? No Yes Yes Yes No  Number falls in past yr: - 1 1 1  -  Injury with Fall? - No No No -  Depression Screen Depression screen [facility] 2/9 10/25/2017 04/14/2017 09/28/2016 06/08/2016 12/11/2015  Decreased Interest 0 0 0 0 0  Down, Depressed, Hopeless 0 0 0 0 0  PHQ - 2 Score 0 0 0 0 0    No results found for this or any previous visit (from the past 2160 hour(s)).  Assessment & Plan:    1. Medicare annual wellness visit, subsequent  Discussed importance of 150 minutes of physical activity weekly, eat two  servings of fish weekly, eat one serving of tree nuts ( cashews, pistachios, pecans, almonds.[name]) every other day, eat 6 servings of fruit/vegetables daily and drink plenty of water and avoid sweet beverages.   2. Flu vaccine need  - Flu vaccine HIGH DOSE PF  3. Bilateral primary osteoarthritis of knee  stable  4. Angina at rest [facility])  Seeing Dr. Clayborn Bigness once a year  5. Gastroesophageal reflux disease without esophagitis  - esomeprazole (NEXIUM) 40 MG capsule; Take 1 capsule (40 mg total) by mouth daily.  Dispense: 90 capsule; Refill: 1  6. Hyperglycemia  - Hemoglobin A1c  7. History of breast cancer, left, T1, N0, Left [facility] - Dec 1996  Mammogram yearly, no longer seeing surgeon   8. Mixed incontinence  Sees Dr. Jacqlyn Larsen  9. Tension headache  - butalbital-acetaminophen-caffeine (FIORICET, ESGIC) 50-325-40 MG tablet; Take 1 tablet by mouth every 6 (six) hours as needed (as needed).  Dispense: 20 tablet; Refill: 0  10. Vitamin D deficiency  - VITAMIN D 25 Hydroxy (Vit-D Deficiency, Fractures)  11. Lower extremity edema  - CBC with Differential/Platelet - COMPLETE METABOLIC PANEL WITH GFR  12. Change in bowel movement  - POC Hemoccult Bld/Stl (3-Cd Home Screen); Future  13. Ovarian failure  - DG Bone Density; Future  Immunization History  Administered Date(s) Administered  . Influenza, High Dose Seasonal PF 10/21/2015, 09/28/2016, 10/25/2017  . Pneumococcal Conjugate-13 12/03/2014  . Pneumococcal Polysaccharide-23 06/15/2012  . Tdap 12/03/2010    Health Maintenance  Topic Date Due  . Samul Dada  12/03/2020  . INFLUENZA VACCINE  Completed  . DEXA SCAN  Completed  . PNA vac Low Risk Adult  Completed    Meds ordered this encounter  Medications  . Coenzyme Q10 (COQ10) 30 MG CAPS    Sig: Take 1 capsule by mouth every other day.  . butalbital-acetaminophen-caffeine (FIORICET, ESGIC) 50-325-40 MG tablet    Sig: Take 1 tablet by mouth every 6 (six) hours as  needed (as needed).    Dispense:  20 tablet    Refill:  0  . esomeprazole (NEXIUM) 40 MG capsule    Sig: Take 1 capsule (40 mg total) by mouth daily.    Dispense:  90 capsule    Refill:  1    Current Outpatient Prescriptions:  .  aspirin EC 81 MG tablet, Take 1 tablet (81 mg total) by mouth daily., Disp: 30 tablet, Rfl: 0 .  butalbital-acetaminophen-caffeine (FIORICET, ESGIC) 50-325-40 MG tablet, Take 1 tablet by mouth every 6 (six) hours as needed (as needed)., Disp: 20 tablet, Rfl: 0 .  Calcium-Magnesium 500-250 MG TABS, Take 2 tablets by mouth 2 (two) times daily., Disp: , Rfl:  .  Cholecalciferol (VITAMIN D3) 2000 units capsule, Take 1 capsule by mouth daily., Disp: , Rfl:  .  Cinnamon 500 MG capsule, Take 1 capsule by mouth daily., Disp: , Rfl:  .  Cranberry ([employer] CRANBERRY) 500 MG CAPS, Take 1 tablet by mouth daily., Disp: , Rfl:  .  esomeprazole (NEXIUM) 40 MG capsule,  Take 1 capsule (40 mg total) by mouth daily., Disp: 90 capsule, Rfl: 1 .  Multiple Vitamins-Calcium (ESSENTIAL ONE DAILY MULTIVIT) TABS, Take by mouth., Disp: , Rfl:  .  nitrofurantoin (MACRODANTIN) 100 MG capsule, Take 100 mg by mouth as needed.  , Disp: , Rfl:  .  Omega-3 1000 MG CAPS, Take 1 capsule by mouth 2 (two) times daily., Disp: , Rfl:  .  Coenzyme Q10 (COQ10) 30 MG CAPS, Take 1 capsule by mouth every other day., Disp: , Rfl:  Medications Discontinued During This Encounter  Medication Reason  . butalbital-acetaminophen-caffeine (FIORICET, ESGIC) 50-325-40 MG tablet Reorder  . esomeprazole (NEXIUM) 40 MG capsule Reorder    I have personally reviewed and addressed the Medicare Annual Wellness health risk assessment questionnaire and have noted the following in the patient's chart:  A.         Medical and social history & family history B.         Use of alcohol, tobacco, and illicit drugs  C.         Current medications and supplements D.         Functional and Cognitive ability and status E.          Nutritional status F.         Physical activity G.        Advance directives H.         List of other physicians I.          Hospitalizations, surgeries, and ER visits in previous 12 months J.         [place] such as hearing, vision, cognitive function, and depression L.         Referrals and appointments: mammogram and bone density next Spring  In addition, I have reviewed and discussed with patient certain preventive protocols, quality metrics, and best practice recommendations. A written personalized care plan for preventive services as well as general preventive health recommendations were provided to patient.  See attached scanned questionnaire for additional information.   Follow up in 6 months and prn

## 2017-11-08 LAB — CBC WITH DIFFERENTIAL/PLATELET
BASOS PCT: 0.3 %
Basophils Absolute: 17 cells/uL (ref 0–200)
EOS ABS: 99 {cells}/uL (ref 15–500)
Eosinophils Relative: 1.7 %
HEMATOCRIT: 35.2 % (ref 35.0–45.0)
Hemoglobin: 10.8 g/dL — ABNORMAL LOW (ref 11.7–15.5)
LYMPHS ABS: 2337 {cells}/uL (ref 850–3900)
MCH: 21.9 pg — ABNORMAL LOW (ref 27.0–33.0)
MCHC: 30.7 g/dL — ABNORMAL LOW (ref 32.0–36.0)
MCV: 71.3 fL — AB (ref 80.0–100.0)
MPV: 9.8 fL (ref 7.5–12.5)
Monocytes Relative: 5.9 %
Neutro Abs: 3004 cells/uL (ref 1500–7800)
Neutrophils Relative %: 51.8 %
Platelets: 230 10*3/uL (ref 140–400)
RBC: 4.94 10*6/uL (ref 3.80–5.10)
RDW: 18.1 % — ABNORMAL HIGH (ref 11.0–15.0)
Total Lymphocyte: 40.3 %
WBC: 5.8 10*3/uL (ref 3.8–10.8)
WBCMIX: 342 {cells}/uL (ref 200–950)

## 2017-11-08 LAB — COMPLETE METABOLIC PANEL WITH GFR
AG RATIO: 1.4 (calc) (ref 1.0–2.5)
ALBUMIN MSPROF: 4.3 g/dL (ref 3.6–5.1)
ALT: 13 U/L (ref 6–29)
AST: 16 U/L (ref 10–35)
Alkaline phosphatase (APISO): 75 U/L (ref 33–130)
BUN: 23 mg/dL (ref 7–25)
CALCIUM: 9.4 mg/dL (ref 8.6–10.4)
CO2: 27 mmol/L (ref 20–32)
Chloride: 103 mmol/L (ref 98–110)
Creat: 0.77 mg/dL (ref 0.60–0.88)
GFR, EST AFRICAN AMERICAN: 84 mL/min/{1.73_m2} (ref >=60)
GFR, EST NON AFRICAN AMERICAN: 72 mL/min/{1.73_m2} (ref >=60)
GLOBULIN: 3.1 g/dL (ref 1.9–3.7)
Glucose, Bld: 84 mg/dL (ref 65–99)
POTASSIUM: 4.2 mmol/L (ref 3.5–5.3)
SODIUM: 141 mmol/L (ref 135–146)
TOTAL PROTEIN: 7.4 g/dL (ref 6.1–8.1)
Total Bilirubin: 0.4 mg/dL (ref 0.2–1.2)

## 2017-11-08 LAB — HEMOGLOBIN A1C
Hgb A1c MFr Bld: 6 % of total Hgb — ABNORMAL HIGH (ref <5.7)
Mean Plasma Glucose: 126 (calc)
eAG (mmol/L): 7 (calc)

## 2017-11-08 LAB — IRON,TIBC AND FERRITIN PANEL
%SAT: 8 % (calc) — ABNORMAL LOW (ref 11–50)
FERRITIN: 8 ng/mL — AB (ref 20–288)
IRON: 41 ug/dL — AB (ref 45–160)
TIBC: 531 ug/dL — AB (ref 250–450)

## 2017-11-08 LAB — VITAMIN D 25 HYDROXY (VIT D DEFICIENCY, FRACTURES): VIT D 25 HYDROXY: 37 ng/mL (ref 30–100)

## 2017-11-08 LAB — TEST AUTHORIZATION

## 2017-11-26 ENCOUNTER — Telehealth: Payer: Self-pay

## 2017-11-26 DIAGNOSIS — G44209 Tension-type headache, unspecified, not intractable: Secondary | ICD-10-CM

## 2017-11-26 NOTE — Telephone Encounter (Signed)
It was supposed to last until her follow up in January.

## 2017-11-26 NOTE — Telephone Encounter (Signed)
Refill request for general medication: Fioricet  Last office visit: 10/25/2017  Last physical exam: 10/25/2017  Follow up visit: 04/25/2017  Prior Authorization needed for Medication or change to Ibuprofen , Naproxen or Fenoprofen per insurance

## 2017-11-29 NOTE — Telephone Encounter (Signed)
Please reply to bolded statement below in previous notes.

## 2017-11-29 NOTE — Telephone Encounter (Signed)
She can take Aleve otc occasionally only. Otherwise Tylenol for pain, no need for nsaid's rx

## 2017-11-29 NOTE — Telephone Encounter (Signed)
Called patient she said she would try Aleve or Tylenol, but would like prior authorization done on Fioricet because she likes to have it on hand if needed. Please initiate prior  Authorization for Fioricet.

## 2017-11-29 NOTE — Telephone Encounter (Signed)
Merri Ray (Key: [id number])   [employer] is reviewing your PA request. Typically an electronic response will be received within 72 hours.

## 2018-01-14 ENCOUNTER — Ambulatory Visit (INDEPENDENT_AMBULATORY_CARE_PROVIDER_SITE_OTHER): Payer: Medicare Other | Admitting: Vascular Surgery

## 2018-02-01 ENCOUNTER — Ambulatory Visit (INDEPENDENT_AMBULATORY_CARE_PROVIDER_SITE_OTHER): Payer: Medicare Other | Admitting: Vascular Surgery

## 2018-02-01 ENCOUNTER — Encounter (INDEPENDENT_AMBULATORY_CARE_PROVIDER_SITE_OTHER): Payer: Self-pay | Admitting: Vascular Surgery

## 2018-02-01 VITALS — BP 136/68 | HR 70 | Resp 16 | Ht 62.0 in | Wt 177.2 lb

## 2018-02-01 DIAGNOSIS — I83893 Varicose veins of bilateral lower extremities with other complications: Secondary | ICD-10-CM

## 2018-02-01 DIAGNOSIS — M7989 Other specified soft tissue disorders: Secondary | ICD-10-CM | POA: Diagnosis not present

## 2018-02-01 NOTE — Assessment & Plan Note (Signed)
Reasonably well controlled.  Not using her compression stockings as she does not like them.

## 2018-02-01 NOTE — Assessment & Plan Note (Signed)
The patient reports that her symptoms are currently doing reasonably well.  She is still troubled by some daily swelling and mild discomfort, but she has grown accustomed to it and she says at this point she is not interested in having anything done we had a pretty long talk about options for treatment.  As long as she is responding well to conservative therapy, no intervention will be planned.  I will see her back on an as-needed basis at this point she knows to contact my office with worsening pain and swelling in her lower extremities.

## 2018-02-01 NOTE — Progress Notes (Signed)
MRN : [id number]  Christina Salas is a 84 y.o. ([birth year]) female who presents with chief complaint of  Chief Complaint  Patient presents with  . Follow-up    6 month follow up  .  History of Present Illness: Patient returns today in follow up of leg swelling and pain.  She says she thinks her legs are doing slightly better.  She has stopped wearing her compression stockings as she says that help and she does not like wearing the she says she is just gotten used to the swelling and deals with it well.  No ulceration or infection.  No fever or chills.  Previously performed duplex shows known deep and superficial venous reflux bilaterally.  Current Outpatient Prescriptions  Medication Sig Dispense Refill  . aspirin EC 81 MG tablet Take 1 tablet (81 mg total) by mouth daily. 30 tablet 0  . butalbital-acetaminophen-caffeine (FIORICET, ESGIC) 50-325-40 MG tablet Take 1 tablet by mouth every 6 (six) hours as needed (as needed). 20 tablet 0  . Calcium-Magnesium 500-250 MG TABS Take 2 tablets by mouth 2 (two) times daily.    . Cholecalciferol (VITAMIN D3) 2000 units capsule Take 1 capsule by mouth daily.    . Cinnamon 500 MG capsule Take 1 capsule by mouth daily.    . Cranberry ([employer] CRANBERRY) 500 MG CAPS Take 1 tablet by mouth daily.    [name] esomeprazole (NEXIUM) 40 MG capsule Take 1 capsule (40 mg total) by mouth daily. 90 capsule 1  . Multiple Vitamins-Calcium (ESSENTIAL ONE DAILY MULTIVIT) TABS Take by mouth.    . nitrofurantoin (MACRODANTIN) 100 MG capsule Take 100 mg by mouth as needed.     . Omega-3 1000 MG CAPS Take 1 capsule by mouth 2 (two) times daily.     No current facility-administered medications for this visit.         Past Medical History:  Diagnosis Date  . Abnormal glucose   . Arthritis   . Breast cancer ([place])   . Cancer ([place])   . Chronic headaches   . GERD (gastroesophageal reflux disease)   . H/O melanoma excision   . History of  diverticulitis   . History of recurrent UTIs   . Osteoporosis    pre-osetoporosis per patient history form 03/18/12  . Vitamin D deficiency          Past Surgical History:  Procedure Laterality Date  . ABDOMINAL HYSTERECTOMY  2001  . BREAST BIOPSY  2010  . BREAST EXCISIONAL BIOPSY Right 1997   Benign excisional biopsy   . COLONOSCOPY  04/02/2014  . CYSTOSCOPY  11/2014  . MASTECTOMY  1996   Left  . [place]   Head    Social History     Social History  Substance Use Topics  . Smoking status: Never Smoker  . Smokeless tobacco: Never Used  . Alcohol use No  No IV drug use  Family History       Family History  Problem Relation Age of Onset  . Breast cancer Mother   . Diabetes Father   . CAD Father   . Breast cancer      Niece  . Cirrhosis Brother   No bleeding or clotting disorders  No Known Allergies   REVIEW OF SYSTEMS(Negative unless checked)  Constitutional: [] Weight loss[] Fever[] Chills Cardiac:[] Chest pain[] Chest pressure[] Palpitations [] Shortness of breath when laying flat [] Shortness of breath at rest [x] Shortness of breath with exertion. Vascular: [x] Pain in legs with walking[x] Pain in legsat rest[] Pain  in legs when laying flat [] Claudication [] Pain in feet when walking [] Pain in feet at rest [] Pain in feet when laying flat [] History of DVT [] Phlebitis [x] Swelling in legs [x] Varicose veins [] Non-healing ulcers Pulmonary: [] Uses home oxygen [] Productive cough[] Hemoptysis [] Wheeze [] COPD [] Asthma Neurologic: [] Dizziness [] Blackouts [] Seizures [] History of stroke [] History of TIA[] Aphasia [] Temporary blindness[] Dysphagia [] Weaknessor numbness in arms [] Weakness or numbnessin legs Musculoskeletal: [x] Arthritis [] Joint swelling [] Joint pain [] Low back pain Hematologic:[] Easy bruising[] Easy bleeding [] Hypercoagulable state  [] Anemic [] Hepatitis Gastrointestinal:[] Blood in stool[] Vomiting blood[x] Gastroesophageal reflux/heartburn[x] Difficulty swallowing. Genitourinary: [] Chronic kidney disease [] Difficulturination [] Frequenturination [] Burning with urination[] Blood in urine Skin: [] Rashes [] Ulcers [] Wounds Psychological No anxiety or depression      Physical Examination  BP 136/68 (BP Location: Right Arm)   Pulse 70   Resp 16   Ht 5\' 2"  (1.575 m)   Wt 80.4 kg (177 lb 3.2 oz)   BMI 32.41 kg/m  Gen:  WD/WN, NAD.  Appears younger than stated age  Head: [place]/AT, No temporalis wasting. Ear/Nose/Throat: Hearing grossly intact, nares w/o erythema or drainage, trachea midline Eyes: Conjunctiva clear. Sclera non-icteric Neck: Supple.  No JVD.  Pulmonary:  Good air movement, no use of accessory muscles.  Cardiac: RRR, normal S1, S2 Vascular: diffuse varicosities bilaterally measuring 1-2 mm Vessel Right Left  Radial Palpable Palpable                          PT Not Palpable 1+ Palpable  DP Palpable Palpable    Musculoskeletal: M/S 5/5 throughout.  No deformity or atrophy. 1+ BLE edema. Neurologic: Sensation grossly intact in extremities.  Symmetrical.  Speech is fluent.  Psychiatric: Judgment intact, Mood & affect appropriate for pt's clinical situation. Dermatologic: No rashes or ulcers noted.  No cellulitis or open wounds.       Labs Recent Results (from the past 2160 hour(s))  CBC with Differential/Platelet     Status: Abnormal   Collection Time: 11/04/17  9:23 AM  Result Value Ref Range   WBC 5.8 3.8 - 10.8 Thousand/uL   RBC 4.94 3.80 - 5.10 Million/uL   Hemoglobin 10.8 (L) 11.7 - 15.5 g/dL   HCT 35.2 35.0 - 45.0 %   MCV 71.3 (L) 80.0 - 100.0 fL   MCH 21.9 (L) 27.0 - 33.0 pg   MCHC 30.7 (L) 32.0 - 36.0 g/dL   RDW 18.1 (H) 11.0 - 15.0 %   Platelets 230 140 - 400 Thousand/uL   MPV 9.8 7.5 - 12.5 fL   Neutro Abs 3,004 1,500 - 7,800 cells/uL   Lymphs Abs  2,337 850 - 3,900 cells/uL   WBC mixed population 342 200 - 950 cells/uL   Eosinophils Absolute 99 15 - 500 cells/uL   Basophils Absolute 17 0 - 200 cells/uL   Neutrophils Relative % 51.8 %   Total Lymphocyte 40.3 %   Monocytes Relative 5.9 %   Eosinophils Relative 1.7 %   Basophils Relative 0.3 %  COMPLETE METABOLIC PANEL WITH GFR     Status: None   Collection Time: 11/04/17  9:23 AM  Result Value Ref Range   Glucose, Bld 84 65 - 99 mg/dL    Comment: .            Fasting reference interval .    BUN 23 7 - 25 mg/dL   Creat 0.77 0.60 - 0.88 mg/dL    Comment: For patients >66 years of age, the reference limit for Creatinine is approximately 13% higher for people identified as African-American. [name]  GFR, Est Non African American 72 > OR = 60 mL/min/1.33m2   GFR, Est African American 84 > OR = 60 mL/min/1.76m2   BUN/Creatinine Ratio NOT APPLICABLE 6 - 22 (calc)   Sodium 141 135 - 146 mmol/L   Potassium 4.2 3.5 - 5.3 mmol/L   Chloride 103 98 - 110 mmol/L   CO2 27 20 - 32 mmol/L   Calcium 9.4 8.6 - 10.4 mg/dL   Total Protein 7.4 6.1 - 8.1 g/dL   Albumin 4.3 3.6 - 5.1 g/dL   Globulin 3.1 1.9 - 3.7 g/dL (calc)   AG Ratio 1.4 1.0 - 2.5 (calc)   Total Bilirubin 0.4 0.2 - 1.2 mg/dL   Alkaline phosphatase (APISO) 75 33 - 130 U/L   AST 16 10 - 35 U/L   ALT 13 6 - 29 U/L  Hemoglobin A1c     Status: Abnormal   Collection Time: 11/04/17  9:23 AM  Result Value Ref Range   Hgb A1c MFr Bld 6.0 (H) <5.7 % of total Hgb    Comment: For someone without known diabetes, a hemoglobin  A1c value between 5.7% and 6.4% is consistent with prediabetes and should be confirmed with a  follow-up test. . For someone with known diabetes, a value <7% indicates that their diabetes is well controlled. A1c targets should be individualized based on duration of diabetes, age, comorbid conditions, and other considerations. . This assay result is consistent with an increased risk of  diabetes. . Currently, no consensus exists regarding use of hemoglobin A1c for diagnosis of diabetes for children. .    Mean Plasma Glucose 126 (calc)   eAG (mmol/L) 7.0 (calc)  VITAMIN D 25 Hydroxy (Vit-D Deficiency, Fractures)     Status: None   Collection Time: 11/04/17  9:23 AM  Result Value Ref Range   Vit D, 25-Hydroxy 37 30 - 100 ng/mL    Comment: Vitamin D Status         25-OH Vitamin D: . Deficiency:                    <20 ng/mL Insufficiency:             20 - 29 ng/mL Optimal:                 > or = 30 ng/mL . For 25-OH Vitamin D testing on patients on  D2-supplementation and patients for whom quantitation  of D2 and D3 fractions is required, the QuestAssureD(TM) 25-OH VIT D, (D2,D3), LC/MS/MS is recommended: order  code [id number] (patients >70yrs). . For more information on this test, go to: http://education.questdiagnostics.com/faq/FAQ163 (This link is being provided for  informational/educational purposes only.)   Iron, TIBC and Ferritin Panel     Status: Abnormal   Collection Time: 11/04/17  9:23 AM  Result Value Ref Range   Iron 41 (L) 45 - 160 mcg/dL   TIBC 531 (H) 250 - 450 mcg/dL (calc)   %SAT 8 (L) 11 - 50 % (calc)   Ferritin 8 (L) 20 - 288 ng/mL  TEST AUTHORIZATION     Status: None   Collection Time: 11/04/17  9:23 AM  Result Value Ref Range   TEST NAME: IRON, TIBC AND FERRITIN PANEL    TEST CODE: [id number]    CLIENT CONTACT: ASHLEY SMITH    REPORT ALWAYS MESSAGE SIGNATURE      Comment: . The laboratory testing on this patient was verbally requested or confirmed by the ordering physician or his  or her authorized representative after contact with an employee of [employer]. Federal regulations require that we maintain on file written authorization for all laboratory testing.  Accordingly we are asking that the ordering physician or his or her authorized representative sign a copy of this report and promptly return it to the client service  representative. . . Signature:____________________________________________________ . Please fax this signed page to [phone] or return it via your [employer] courier.     Radiology No results found.  Assessment/Plan  Varicose veins of leg with swelling, bilateral The patient reports that her symptoms are currently doing reasonably well.  She is still troubled by some daily swelling and mild discomfort, but she has grown accustomed to it and she says at this point she is not interested in having anything done we had a pretty long talk about options for treatment.  As long as she is responding well to conservative therapy, no intervention will be planned.  I will see her back on an as-needed basis at this point she knows to contact my office with worsening pain and swelling in her lower extremities.  Swelling of limb Reasonably well controlled.  Not using her compression stockings as she does not like them.    Leotis Pain, MD  02/01/2018 12:09 PM    This note was created with Dragon medical transcription system.  Any errors from dictation are purely unintentional

## 2018-03-28 ENCOUNTER — Telehealth: Payer: Self-pay | Admitting: Family Medicine

## 2018-03-28 NOTE — Telephone Encounter (Signed)
She is due for follow up in April, I can order during her visit.

## 2018-03-28 NOTE — Telephone Encounter (Signed)
Pt states that it is time for her mammogram and is needing a referral. Please send order to [facility].

## 2018-03-29 NOTE — Telephone Encounter (Signed)
Spoke with pt and she verbalized understanding.

## 2018-04-19 ENCOUNTER — Ambulatory Visit: Payer: Medicare Other | Admitting: Nurse Practitioner

## 2018-04-19 ENCOUNTER — Encounter: Payer: Self-pay | Admitting: Nurse Practitioner

## 2018-04-19 VITALS — BP 122/84 | HR 98 | Temp 98.1°F | Resp 16 | Ht 62.0 in | Wt 180.9 lb

## 2018-04-19 DIAGNOSIS — E7849 Other hyperlipidemia: Secondary | ICD-10-CM | POA: Diagnosis not present

## 2018-04-19 DIAGNOSIS — K2100 Gastro-esophageal reflux disease with esophagitis, without bleeding: Secondary | ICD-10-CM

## 2018-04-19 DIAGNOSIS — R195 Other fecal abnormalities: Secondary | ICD-10-CM | POA: Diagnosis not present

## 2018-04-19 DIAGNOSIS — E66811 Obesity, class 1: Secondary | ICD-10-CM

## 2018-04-19 DIAGNOSIS — R059 Cough, unspecified: Secondary | ICD-10-CM

## 2018-04-19 DIAGNOSIS — R05 Cough: Secondary | ICD-10-CM

## 2018-04-19 DIAGNOSIS — E669 Obesity, unspecified: Secondary | ICD-10-CM | POA: Diagnosis not present

## 2018-04-19 DIAGNOSIS — R7303 Prediabetes: Secondary | ICD-10-CM

## 2018-04-19 DIAGNOSIS — K5790 Diverticulosis of intestine, part unspecified, without perforation or abscess without bleeding: Secondary | ICD-10-CM

## 2018-04-19 DIAGNOSIS — E559 Vitamin D deficiency, unspecified: Secondary | ICD-10-CM

## 2018-04-19 DIAGNOSIS — K219 Gastro-esophageal reflux disease without esophagitis: Secondary | ICD-10-CM

## 2018-04-19 DIAGNOSIS — G44209 Tension-type headache, unspecified, not intractable: Secondary | ICD-10-CM

## 2018-04-19 DIAGNOSIS — I872 Venous insufficiency (chronic) (peripheral): Secondary | ICD-10-CM

## 2018-04-19 MED ORDER — BENZONATATE 100 MG PO CAPS: 100.0000 mg | ORAL_CAPSULE | Freq: Three times a day (TID) | ORAL | 0 refills | Status: DC | PRN

## 2018-04-19 MED ORDER — BUTALBITAL-APAP-CAFFEINE 50-325-40 MG PO TABS: 1.0000 | ORAL_TABLET | Freq: Four times a day (QID) | ORAL | 0 refills | Status: DC | PRN

## 2018-04-19 MED ORDER — ESOMEPRAZOLE MAGNESIUM 20 MG PO CPDR
20.0000 mg | DELAYED_RELEASE_CAPSULE | Freq: Every day | ORAL | 0 refills | Status: DC
Start: 2018-04-19 — End: 2018-07-27

## 2018-04-19 NOTE — Patient Instructions (Signed)
Please do call to schedule your mammogram; the number to schedule one at either [facility] or [facility] is [phone]  If you have not heard anything from my staff in a week about any orders/referrals/studies from today, please contact us here to follow-up [phone]   - start taking iron pills - follow up with GI

## 2018-04-19 NOTE — Progress Notes (Addendum)
Name: Christina Salas   MRN: [id number]    DOB: [birth year]   Date:04/19/2018       Progress Note  Subjective  Chief Complaint  Chief Complaint  Patient presents with  . Allergic Rhinitis   . Follow-up    6 month recheck    HPI  Headaches Combination of tension type headaches that is chronic with occasional migraines. Normally relieved by Excedrin but rarely require 1/2 tablet of Fioricet. [place] takes it 2 times a month.   GERD/ Diverticular Disease Reflux is fairly well controlled GERD with Nexium daily. Sts unknown food triggers- does avoid meat and spicy foods in general has small frequent BM at baseline but states BM are larger now but regular solid stools. Does not take OTC stool softners.   Hypercholesteremia/  Takes aspirin daily, omega-3 and coenzyme Q10 ; saw cardiologist [place] annually due to angina at rest. sts has resolved. Negative stress test.   Diet Banana sandwiches, fiber bars, salads, spaghetti, pizza, grilled chicken, green beans  Vitamin D deficiency  Stopped taking OTC vitamin D a few months ago. Endorses mild intermittent fatigue   Cough Has PND and seasonal allergies ongoing for the past few weeks. Takes allergra and delsym cough syrip at home. Denies fevers, chills, sore throat. Lots of sneezing   Patient Active Problem List   Diagnosis Date Noted  . Swelling of limb 04/27/2017  . Prediabetes 06/08/2016  . Obesity (BMI 30.0-34.9) 06/08/2016  . Vitamin D deficiency 06/08/2016  . Post-menopausal 12/11/2015  . DD (diverticular disease) 06/19/2015  . Edema extremities 06/19/2015  . Esophageal reflux 06/19/2015  . HLD (hyperlipidemia) 06/19/2015  . Varicose veins of leg with swelling, bilateral 06/19/2015  . Recurrent UTI 02/08/2013  . Mixed incontinence 02/08/2013  . History of breast cancer, left, T1, N0, Left MRM - Dec 1996 03/18/2012  . Tension headache 05/24/2007  . History of melanoma 08/28/1994    Past Medical History:  Diagnosis  Date  . Abnormal glucose   . Arthritis   . Breast cancer ([place])   . Cancer ([place])   . Chronic headaches   . GERD (gastroesophageal reflux disease)   . H/O melanoma excision   . History of diverticulitis   . History of recurrent UTIs   . Osteoporosis    pre-osetoporosis per patient history form 03/18/12  . Vitamin D deficiency     Past Surgical History:  Procedure Laterality Date  . ABDOMINAL HYSTERECTOMY  2001  . BREAST BIOPSY  2010  . BREAST EXCISIONAL BIOPSY Right 1997   Benign excisional biopsy   . COLONOSCOPY  04/02/2014  . CYSTOSCOPY  11/2014  . MASTECTOMY  1996   Left  . MELANOMA EXCISION  1995   Head    Social History   Tobacco Use  . Smoking status: Never Smoker  . Smokeless tobacco: Never Used  Substance Use Topics  . Alcohol use: No    Alcohol/week: 0.0 oz     Current Outpatient Medications:  .  aspirin EC 81 MG tablet, Take 1 tablet (81 mg total) by mouth daily., Disp: 30 tablet, Rfl: 0 .  butalbital-acetaminophen-caffeine (FIORICET, ESGIC) 50-325-40 MG tablet, Take 1 tablet by mouth every 6 (six) hours as needed (as needed)., Disp: 20 tablet, Rfl: 0 .  Calcium-Magnesium 500-250 MG TABS, Take 2 tablets by mouth 2 (two) times daily., Disp: , Rfl:  .  Cholecalciferol (VITAMIN D3) 2000 units capsule, Take 1 capsule by mouth daily., Disp: , Rfl:  .  Cinnamon 500 MG  capsule, Take 1 capsule by mouth daily., Disp: , Rfl:  .  Coenzyme Q10 (COQ10) 30 MG CAPS, Take 1 capsule by mouth every other day., Disp: , Rfl:  .  Cranberry ([employer]S CRANBERRY) 500 MG CAPS, Take 1 tablet by mouth daily., Disp: , Rfl:  .  esomeprazole (NEXIUM) 40 MG capsule, Take 1 capsule (40 mg total) by mouth daily., Disp: 90 capsule, Rfl: 1 .  Multiple Vitamins-Calcium (ESSENTIAL ONE DAILY MULTIVIT) TABS, Take by mouth., Disp: , Rfl:  .  nitrofurantoin (MACRODANTIN) 100 MG capsule, Take 100 mg by mouth as needed.  , Disp: , Rfl:  .  Omega-3 1000 MG CAPS, Take 1 capsule by mouth 2 (two) times daily.,  Disp: , Rfl:   No Known Allergies  ROS  Constitutional: Negative for fever or weight change.  Respiratory: Negative for cough and Positive for exertional shortness of breath-stable.   Cardiovascular: Negative for chest pain or palpitations.  Gastrointestinal: Negative for abdominal pain, no bowel changes.  Musculoskeletal: Negative for gait problem or joint swelling. States knee pain- stable; chronic  Skin: Negative for rash.  Neurological: Negative for dizziness Positive for headache and lightheadedness occasionally with position change.  No other specific complaints in a complete review of systems (except as listed in HPI above).  Objective  Vitals:   04/19/18 1034  BP: 122/84  Pulse: 98  Resp: 16  Temp: 98.1 F (36.7 C)  TempSrc: Oral  SpO2: 93%  Weight: 180 lb 14.4 oz (82.1 kg)  Height: 5\' 2"  (1.575 m)    Body mass index is 33.09 kg/m.  Nursing Note and Vital Signs reviewed.  Physical Exam  Constitutional: Patient appears well-developed and well-nourished. Obese No distress.  HEENT: head atraumatic, normocephalic, pupils equal and reactive to light, TM's without erythema or bulging,  no maxillary or frontal sinus tenderness , neck supple without lymphadenopathy, oropharynx pink and moist without exudate, no nasal discharge Cardiovascular: Normal rate, regular rhythm, S1/S2 present.  murmur present no rub heard. Pulses intact Pulmonary/Chest: Effort normal and breath sounds clear. No respiratory distress or retractions. Abdominal: Soft and non-tender, bowel sounds present  Psychiatric: Patient has a normal mood and affect. behavior is normal. Judgment and thought content normal.  No results found for this or any previous visit (from the past 72 hour(s)).  Assessment & Plan 1. Venous reflux -compression stocking; vascular followup   2. Gastroesophageal reflux disease with esophagitis - decrease PPI; patient states has been working well in the past   3. DD  (diverticular disease) - send to GI   4. Other hyperlipidemia -discussed diet   5. Obesity (BMI 30.0-34.9) - discussed importance of proper nutrition  6. Vitamin D deficiency  - Vitamin D (25 hydroxy)  7. Tension headache  - butalbital-acetaminophen-caffeine (FIORICET, ESGIC) 50-325-40 MG tablet; Take 1 tablet by mouth every 6 (six) hours as needed (as needed).  Dispense: 20 tablet; Refill: 0  8. Gastroesophageal reflux disease without esophagitis  - esomeprazole (NEXIUM) 20 MG capsule; Take 1 capsule (20 mg total) by mouth daily.  Dispense: 90 capsule; Refill: 0  9. Cough  - benzonatate (TESSALON) 100 MG capsule; Take 1 capsule (100 mg total) by mouth 3 (three) times daily as needed for cough.  Dispense: 20 capsule; Refill: 0  10. Prediabetes  - HgB A1c    -Red flags and when to present for emergency care or RTC including fever >101.30F, chest pain, shortness of breath, new/worsening/un-resolving symptoms,  reviewed with patient at time of visit. Follow up  and care instructions discussed and provided in AVS. -Reviewed Health Maintenance:   ----------------------------------------- I have reviewed this encounter including the documentation in this note and/or discussed this patient with the provider, Suezanne Cheshire DNP AGNP-C. I am certifying that I agree with the content of this note as supervising physician. Enid Derry, [facility] 05/17/2018, 8:05 PM

## 2018-04-20 LAB — HEMOGLOBIN A1C
Hgb A1c MFr Bld: 6.4 % of total Hgb — ABNORMAL HIGH (ref <5.7)
MEAN PLASMA GLUCOSE: 137 (calc)
eAG (mmol/L): 7.6 (calc)

## 2018-04-20 LAB — VITAMIN D 25 HYDROXY (VIT D DEFICIENCY, FRACTURES): Vit D, 25-Hydroxy: 25 ng/mL — ABNORMAL LOW (ref 30–100)

## 2018-04-25 ENCOUNTER — Ambulatory Visit: Payer: Medicare Other | Admitting: Nurse Practitioner

## 2018-05-06 ENCOUNTER — Other Ambulatory Visit: Payer: Self-pay | Admitting: Family Medicine

## 2018-05-06 DIAGNOSIS — Z1231 Encounter for screening mammogram for malignant neoplasm of breast: Secondary | ICD-10-CM

## 2018-05-11 ENCOUNTER — Ambulatory Visit
Admission: RE | Admit: 2018-05-11 | Discharge: 2018-05-11 | Disposition: A | Discharge status: Home / Self Care | Payer: Medicare Other | Source: Ambulatory Visit | Attending: Family Medicine | Admitting: Family Medicine

## 2018-05-11 DIAGNOSIS — Z1231 Encounter for screening mammogram for malignant neoplasm of breast: Secondary | ICD-10-CM | POA: Insufficient documentation

## 2018-07-27 ENCOUNTER — Other Ambulatory Visit: Payer: Self-pay | Admitting: Family Medicine

## 2018-07-27 DIAGNOSIS — K219 Gastro-esophageal reflux disease without esophagitis: Secondary | ICD-10-CM

## 2018-07-27 NOTE — Addendum Note (Signed)
Addended by: Inda Coke on: 07/27/2018 04:42 PM   Modules accepted: Orders

## 2018-07-27 NOTE — Telephone Encounter (Signed)
Will route to office for provider review. 

## 2018-07-27 NOTE — Telephone Encounter (Signed)
Copied from [facility] [phone]. Topic: Quick Communication - Rx Refill/Question >> Jul 27, 2018 11:02 AM Antonieta Iba C wrote: Medication: esomeprazole (NEXIUM) 20 MG capsule  Has the patient contacted their pharmacy? No  (Agent: If no, request that the patient contact the pharmacy for the refill.) (Agent: If yes, when and what did the pharmacy advise?)  Preferred Pharmacy (with phone number or street name): [id number] - [place], [place] [address]  Agent: Please be advised that RX refills may take up to 3 business days. We ask that you follow-up with your pharmacy.

## 2018-07-28 MED ORDER — ESOMEPRAZOLE MAGNESIUM 20 MG PO CPDR
20.0000 mg | DELAYED_RELEASE_CAPSULE | Freq: Every day | ORAL | 0 refills | Status: DC
Start: 2018-07-28 — End: 2018-09-26

## 2018-09-26 ENCOUNTER — Other Ambulatory Visit: Payer: Self-pay | Admitting: Nurse Practitioner

## 2018-09-26 DIAGNOSIS — K219 Gastro-esophageal reflux disease without esophagitis: Secondary | ICD-10-CM

## 2018-10-19 ENCOUNTER — Encounter: Payer: Self-pay | Admitting: Family Medicine

## 2018-10-19 ENCOUNTER — Ambulatory Visit: Payer: Medicare Other | Admitting: Family Medicine

## 2018-10-19 ENCOUNTER — Other Ambulatory Visit: Payer: Self-pay | Admitting: Family Medicine

## 2018-10-19 VITALS — BP 118/70 | HR 81 | Temp 97.7°F | Resp 16 | Ht 62.0 in | Wt 175.5 lb

## 2018-10-19 DIAGNOSIS — G44209 Tension-type headache, unspecified, not intractable: Secondary | ICD-10-CM

## 2018-10-19 DIAGNOSIS — K219 Gastro-esophageal reflux disease without esophagitis: Secondary | ICD-10-CM

## 2018-10-19 DIAGNOSIS — N390 Urinary tract infection, site not specified: Secondary | ICD-10-CM

## 2018-10-19 DIAGNOSIS — F32A Depression, unspecified: Secondary | ICD-10-CM

## 2018-10-19 DIAGNOSIS — F320 Major depressive disorder, single episode, mild: Secondary | ICD-10-CM

## 2018-10-19 DIAGNOSIS — R739 Hyperglycemia, unspecified: Secondary | ICD-10-CM

## 2018-10-19 DIAGNOSIS — R7303 Prediabetes: Secondary | ICD-10-CM

## 2018-10-19 DIAGNOSIS — D508 Other iron deficiency anemias: Secondary | ICD-10-CM

## 2018-10-19 DIAGNOSIS — Z23 Encounter for immunization: Secondary | ICD-10-CM | POA: Diagnosis not present

## 2018-10-19 DIAGNOSIS — I208 Other forms of angina pectoris: Secondary | ICD-10-CM | POA: Diagnosis not present

## 2018-10-19 DIAGNOSIS — N3946 Mixed incontinence: Secondary | ICD-10-CM

## 2018-10-19 DIAGNOSIS — I2089 Other forms of angina pectoris: Secondary | ICD-10-CM

## 2018-10-19 DIAGNOSIS — R600 Localized edema: Secondary | ICD-10-CM

## 2018-10-19 MED ORDER — CITALOPRAM HYDROBROMIDE 10 MG PO TABS: 10.0000 mg | ORAL_TABLET | Freq: Every day | ORAL | 1 refills | Status: DC

## 2018-10-19 MED ORDER — ESOMEPRAZOLE MAGNESIUM 20 MG PO CPDR: 20.0000 mg | DELAYED_RELEASE_CAPSULE | ORAL | 0 refills | Status: DC

## 2018-10-19 NOTE — Progress Notes (Signed)
Name: Christina Salas   MRN: [id number]    DOB: 1933-03-14   Date:10/19/2018       Progress Note  Subjective  Chief Complaint  Chief Complaint  Patient presents with  . Follow-up    6 mth f/u  . Gastroesophageal Reflux    Has been reducing her dosage and doing well- will only take the other half if she has symptom flair ups  . Edema  . Headache  . Hyperlipidemia  . OA  . Hyperglycemia  . Urinary Incontinence  . Sciatica  . Chest Pain  . hx of breast cancer    HPI  Iron deficiency anemia: found last year, she did not want to see GI and was given hemoccult cards but she never collected specimen. She states bowel changes have improved from last year, Change in bowel movements: she still has indigestion, bloating. She states last year her bowel movements was more frequent , multiple episodes a few times a week, but not as severe now. She denies blood in stools.   Angina at rest: she states symptoms were worse Spring of 2017  went to Dr. Clayborn Bigness and had a normal stress test. She states her heart gurgles intermittently, she has SOB with mild/moderate activity. She is doing better now, off  aspirin because she has been taking excedrin for migraine  Pre-diabetes: she denies polyphagia, polydipsia, she states no change in urinary frequency.last hgbA1C up to 6.4%  Edema extremities: she is trying to walk more, wearing compression stocking hoses, but sometimes does not seem to help with swelling. Denies claudication , still sees Dr. Lucky Cowboy, but now only on prn basis .   GERD: she is taking Nexium 20 mg most days but sometimes she needs to take two daily, she denies heartburn, but has indigestion   Headaches: dull aching pain usually frontal, occasionally temporal and sharp pain, intermittently and only takes medications prn either fioricet or excedrin . No photophobia or phonophobia  History of breast cancer: she still gets mammograms, no lumps or nipple discharge at this time, she  gets mammogram , currently going to [facility].   Depression: she has noticed lack of motivation, prefers getting out of the house, sometimes does not want to take showers or work on her projects. phq 9 is up to 7, she states many years ago she had to take valium and did not like it. She is willing to try SSRI. Denies suicidal thoughts or ideation   Urinary incontinence and recurrent UTI's: she used to see Dr. Jacqlyn Larsen, he used to give her macrobid 44 with refills to take prn, advised to follow up with another Urologist , she thinks she will keep going to [facility]  Patient Active Problem List   Diagnosis Date Noted  . Venous reflux 04/19/2018  . Swelling of limb 04/27/2017  . Prediabetes 06/08/2016  . Obesity (BMI 30.0-34.9) 06/08/2016  . Vitamin D deficiency 06/08/2016  . Post-menopausal 12/11/2015  . DD (diverticular disease) 06/19/2015  . Edema extremities 06/19/2015  . Esophageal reflux 06/19/2015  . HLD (hyperlipidemia) 06/19/2015  . Varicose veins of leg with swelling, bilateral 06/19/2015  . Recurrent UTI 02/08/2013  . Mixed incontinence 02/08/2013  . History of breast cancer, left, T1, N0, Left MRM - Dec 1996 03/18/2012  . Tension headache 05/24/2007  . History of melanoma 08/28/1994    Past Surgical History:  Procedure Laterality Date  . ABDOMINAL HYSTERECTOMY  2001  . BREAST BIOPSY  2010  . BREAST EXCISIONAL BIOPSY Right 1997  Benign excisional biopsy   . COLONOSCOPY  04/02/2014  . CYSTOSCOPY  11/2014  . MASTECTOMY  1996   Left  . MELANOMA EXCISION  1995   Head    Family History  Problem Relation Age of Onset  . Breast cancer Mother   . Diabetes Father   . CAD Father   . Breast cancer Unknown        Niece  . Cirrhosis Brother     Social History   Socioeconomic History  . Marital status: Single    Spouse name: Not on file  . Number of children: 0  . Years of education: Not on file  . Highest education level: Not on file  Occupational History  .  Occupation: Retired  Scientific laboratory technician  . Financial resource strain: Not on file  . Food insecurity:    Worry: Not on file    Inability: Not on file  . Transportation needs:    Medical: Not on file    Non-medical: Not on file  Tobacco Use  . Smoking status: Never Smoker  . Smokeless tobacco: Never Used  Substance and Sexual Activity  . Alcohol use: No    Alcohol/week: 0.0 standard drinks  . Drug use: No  . Sexual activity: Not Currently  Lifestyle  . Physical activity:    Days per week: Not on file    Minutes per session: Not on file  . Stress: Not on file  Relationships  . Social connections:    Talks on phone: Not on file    Gets together: Not on file    Attends religious service: Not on file    Active member of club or organization: Not on file    Attends meetings of clubs or organizations: Not on file    Relationship status: Not on file  . Intimate partner violence:    Fear of current or ex partner: Not on file    Emotionally abused: Not on file    Physically abused: Not on file    Forced sexual activity: Not on file  Other Topics Concern  . Not on file  Social History Narrative   She lives by herself   She has nieces and nephews in the area   She was a Pharmacist, hospital for over 5 years, retired in 1992     Current Outpatient Medications:  .  butalbital-acetaminophen-caffeine (FIORICET, ESGIC) 50-325-40 MG tablet, Take 1 tablet by mouth every 6 (six) hours as needed (as needed)., Disp: 20 tablet, Rfl: 0 .  Calcium-Magnesium 500-250 MG TABS, Take 2 tablets by mouth 2 (two) times daily., Disp: , Rfl:  .  Cholecalciferol (VITAMIN D3) 1000 units CAPS, Take 1 capsule by mouth daily. , Disp: , Rfl:  .  Cinnamon 500 MG capsule, Take 1 capsule by mouth daily., Disp: , Rfl:  .  Coenzyme Q10 (COQ10) 30 MG CAPS, Take 1 capsule by mouth every other day., Disp: , Rfl:  .  Cranberry ([employer] CRANBERRY) 500 MG CAPS, Take 1 tablet by mouth daily., Disp: , Rfl:  .  Elastic Bandages & Supports  (ULTRA SUPPORT SPINAL ORTHOSIS) MISC, Wear upper back (thoracic) support brace daily., Disp: , Rfl:  .  esomeprazole (NEXIUM) 20 MG capsule, Take 1-2 capsules (20-40 mg total) by mouth every morning., Disp: 100 capsule, Rfl: 0 .  Multiple Vitamins-Calcium (ESSENTIAL ONE DAILY MULTIVIT) TABS, Take by mouth., Disp: , Rfl:  .  nitrofurantoin (MACRODANTIN) 100 MG capsule, Take 100 mg by mouth as needed.  , Disp: ,  Rfl:  .  Omega-3 1000 MG CAPS, Take 1 capsule by mouth 2 (two) times daily., Disp: , Rfl:  .  citalopram (CELEXA) 10 MG tablet, Take 1 tablet (10 mg total) by mouth daily., Disp: 30 tablet, Rfl: 1  No Known Allergies  I personally reviewed active problem list, medication list, allergies, family history, social history with the patient/caregiver today.   ROS  Constitutional: Negative for fever , positive for weight change.  Respiratory: Negative for cough and shortness of breath.   Cardiovascular: Negative for chest pain or palpitations.  Gastrointestinal: Negative for abdominal pain,positive  bowel changes.  Musculoskeletal: Negative for gait problem or joint swelling.  Skin: Negative for rash.  Neurological: Negative for dizziness or headache.  No other specific complaints in a complete review of systems (except as listed in HPI above).  Objective  Vitals:   10/19/18 0909  BP: 118/70  Pulse: 81  Resp: 16  Temp: 97.7 F (36.5 C)  TempSrc: Oral  SpO2: 95%  Weight: 175 lb 8 oz (79.6 kg)  Height: 5\' 2"  (1.575 m)    Body mass index is 32.1 kg/m.  Physical Exam  Constitutional: Patient appears well-developed and well-nourished. Obese  No distress.  HEENT: head atraumatic, normocephalic, pupils equal and reactive to light,  neck supple, throat within normal limits Cardiovascular: Normal rate, regular rhythm and normal heart sounds.  No murmur heard. No BLE edema. Pulmonary/Chest: Effort normal and breath sounds normal. No respiratory distress. Abdominal: Soft.  There is  no tenderness. Psychiatric: Patient has a normal mood and affect. behavior is normal. Judgment and thought content normal.  PHQ2/9: Depression screen [facility] 2/9 10/19/2018 10/25/2017 04/14/2017 09/28/2016 06/08/2016  Decreased Interest 1 0 0 0 0  Down, Depressed, Hopeless 1 0 0 0 0  PHQ - 2 Score 2 0 0 0 0  Altered sleeping 1 - - - -  Tired, decreased energy 2 - - - -  Change in appetite 0 - - - -  Feeling bad or failure about yourself  1 - - - -  Trouble concentrating 0 - - - -  Moving slowly or fidgety/restless 1 - - - -  Suicidal thoughts 0 - - - -  PHQ-9 Score 7 - - - -  Difficult doing work/chores Somewhat difficult - - - -     Fall Risk: Fall Risk  10/19/2018 10/25/2017 04/14/2017 12/16/2016 09/28/2016  Falls in the past year? No No Yes Yes Yes  Number falls in past yr: - - 1 1 1   Injury with Fall? - - No No No     Functional Status Survey: Is the patient deaf or have difficulty hearing?: Yes(hearing aid in bilateral ears) Does the patient have difficulty seeing, even when wearing glasses/contacts?: Yes Does the patient have difficulty concentrating, remembering, or making decisions?: No Does the patient have difficulty walking or climbing stairs?: No Does the patient have difficulty dressing or bathing?: No Does the patient have difficulty doing errands alone such as visiting a doctor's office or shopping?: No    Assessment & Plan  1. Gastroesophageal reflux disease without esophagitis  - esomeprazole (NEXIUM) 20 MG capsule; Take 1-2 capsules (20-40 mg total) by mouth every morning.  Dispense: 100 capsule; Refill: 0 - Ambulatory referral to Gastroenterology  2. Need for immunization against influenza  - Flu vaccine HIGH DOSE PF (Fluzone High dose)  3. Prediabetes  - COMPLETE METABOLIC PANEL WITH GFR  4. Angina at rest [facility])  Stable   5. Mixed  incontinence  Needs to go back to Urologist   6. Lower extremity edema  stable  7. Tension  headache  stable  8. Recurrent UTI  She will decide who to see   9. Mild depression (HCC)  - citalopram (CELEXA) 10 MG tablet; Take 1 tablet (10 mg total) by mouth daily.  Dispense: 30 tablet; Refill: 1 - COMPLETE METABOLIC PANEL WITH GFR  10. Other iron deficiency anemia  - CBC with Differential/Platelet - Iron, TIBC and Ferritin Panel - Ambulatory referral to Gastroenterology  11. Hyperglycemia  - Hemoglobin A1c - COMPLETE METABOLIC PANEL WITH GFR

## 2018-10-20 LAB — COMPLETE METABOLIC PANEL WITH GFR
AG Ratio: 1.4 (calc) (ref 1.0–2.5)
ALT: 17 U/L (ref 6–29)
AST: 20 U/L (ref 10–35)
Albumin: 4.4 g/dL (ref 3.6–5.1)
Alkaline phosphatase (APISO): 73 U/L (ref 33–130)
BUN: 22 mg/dL (ref 7–25)
CALCIUM: 9.9 mg/dL (ref 8.6–10.4)
CHLORIDE: 103 mmol/L (ref 98–110)
CO2: 25 mmol/L (ref 20–32)
CREATININE: 0.82 mg/dL (ref 0.60–0.88)
GFR, EST AFRICAN AMERICAN: 77 mL/min/{1.73_m2} (ref >=60)
GFR, EST NON AFRICAN AMERICAN: 67 mL/min/{1.73_m2} (ref >=60)
Globulin: 3.1 g/dL (calc) (ref 1.9–3.7)
Glucose, Bld: 95 mg/dL (ref 65–99)
POTASSIUM: 4.1 mmol/L (ref 3.5–5.3)
SODIUM: 140 mmol/L (ref 135–146)
TOTAL PROTEIN: 7.5 g/dL (ref 6.1–8.1)
Total Bilirubin: 0.3 mg/dL (ref 0.2–1.2)

## 2018-10-20 LAB — CBC WITH DIFFERENTIAL/PLATELET
Basophils Absolute: 7 cells/uL (ref 0–200)
Basophils Relative: 0.1 %
EOS PCT: 0.6 %
Eosinophils Absolute: 43 cells/uL (ref 15–500)
HCT: 36.8 % (ref 35.0–45.0)
Hemoglobin: 11.6 g/dL — ABNORMAL LOW (ref 11.7–15.5)
LYMPHS ABS: 2606 {cells}/uL (ref 850–3900)
MCH: 23.4 pg — ABNORMAL LOW (ref 27.0–33.0)
MCHC: 31.5 g/dL — AB (ref 32.0–36.0)
MCV: 74.3 fL — ABNORMAL LOW (ref 80.0–100.0)
MPV: 10.1 fL (ref 7.5–12.5)
Monocytes Relative: 6.4 %
NEUTROS PCT: 56.7 %
Neutro Abs: 4082 cells/uL (ref 1500–7800)
Platelets: 219 10*3/uL (ref 140–400)
RBC: 4.95 10*6/uL (ref 3.80–5.10)
RDW: 17.2 % — AB (ref 11.0–15.0)
TOTAL LYMPHOCYTE: 36.2 %
WBC mixed population: 461 cells/uL (ref 200–950)
WBC: 7.2 10*3/uL (ref 3.8–10.8)

## 2018-10-20 LAB — IRON,TIBC AND FERRITIN PANEL
%SAT: 8 % (calc) — ABNORMAL LOW (ref 16–45)
Ferritin: 8 ng/mL — ABNORMAL LOW (ref 16–288)
Iron: 39 ug/dL — ABNORMAL LOW (ref 45–160)
TIBC: 485 mcg/dL (calc) — ABNORMAL HIGH (ref 250–450)

## 2018-10-20 LAB — HEMOGLOBIN A1C
EAG (MMOL/L): 7.4 (calc)
Hgb A1c MFr Bld: 6.3 % of total Hgb — ABNORMAL HIGH (ref <5.7)
Mean Plasma Glucose: 134 (calc)

## 2018-10-21 ENCOUNTER — Other Ambulatory Visit: Payer: Self-pay | Admitting: Family Medicine

## 2018-10-21 DIAGNOSIS — K219 Gastro-esophageal reflux disease without esophagitis: Secondary | ICD-10-CM

## 2018-11-15 ENCOUNTER — Encounter: Payer: Self-pay | Admitting: Gastroenterology

## 2018-11-15 ENCOUNTER — Ambulatory Visit: Payer: Medicare Other | Admitting: Gastroenterology

## 2018-11-15 ENCOUNTER — Other Ambulatory Visit: Payer: Self-pay

## 2018-11-15 VITALS — BP 127/75 | HR 70 | Ht 62.0 in | Wt 177.0 lb

## 2018-11-15 DIAGNOSIS — D500 Iron deficiency anemia secondary to blood loss (chronic): Secondary | ICD-10-CM | POA: Diagnosis not present

## 2018-11-15 NOTE — Progress Notes (Signed)
Gastroenterology Consultation  Referring Provider:     Steele Sizer, MD Primary Care Physician:  Steele Sizer, MD Primary Gastroenterologist:  Dr. Allen Norris     Reason for Consultation:     Iron deficiency anemia        HPI:   Christina Salas is a 84 y.o. y/o female referred for consultation & management of iron deficiency anemia by Dr. Ancil Boozer, Drue Stager, MD.  This patient comes in today after seeing me in the past for a colonoscopy in 2010 and by Dr. Gustavo Lah in 2014 for colonoscopy.  The patient was found to have a low iron with an iron level of 39 and a iron saturation of 8%.  The patient was found to have similar findings approximately 1 year ago on her iron studies.  The patient denies any black stools or bloody stools.  She also denies any abdominal pain nausea vomiting fevers chills or unexplained weight loss.  The patient has a history of reflux and reports that she has been reducing the dose of her medication and continues to be symptom-free. The patient reports that she had a history of anemia when she was younger but not recently.  She also reports that she was started on iron but did not take any of the iron because she wanted to consult with me prior to starting the iron.  Past Medical History:  Diagnosis Date  . Abnormal glucose   . Arthritis   . Breast cancer ([place])   . Cancer ([place])   . Chronic headaches   . GERD (gastroesophageal reflux disease)   . H/O melanoma excision   . History of diverticulitis   . History of recurrent UTIs   . Osteoporosis    pre-osetoporosis per patient history form 03/18/12  . Vitamin D deficiency     Past Surgical History:  Procedure Laterality Date  . ABDOMINAL HYSTERECTOMY  2001  . BREAST BIOPSY  2010  . BREAST EXCISIONAL BIOPSY Right 1997   Benign excisional biopsy   . COLONOSCOPY  04/02/2014  . CYSTOSCOPY  11/2014  . MASTECTOMY  1996   Left  . [place]    Prior to Admission medications   Medication  Sig Start Date End Date Taking? Authorizing Provider  butalbital-acetaminophen-caffeine (FIORICET, ESGIC) 50-325-40 MG tablet Take 1 tablet by mouth every 6 (six) hours as needed (as needed). 04/19/18  Yes Poulose, Bethel Born, NP  Calcium-Magnesium 500-250 MG TABS Take 2 tablets by mouth 2 (two) times daily.   Yes [provider]  Cholecalciferol (VITAMIN D3) 1000 units CAPS Take 1 capsule by mouth daily.    Yes [provider]  Cinnamon 500 MG capsule Take 1 capsule by mouth daily.   Yes [provider]  Coenzyme Q10 (COQ10) 30 MG CAPS Take 1 capsule by mouth every other day.   Yes [provider]  Cranberry ([employer] CRANBERRY) 500 MG CAPS Take 1 tablet by mouth daily.   Yes [provider]  Elastic Bandages & Supports (ULTRA SUPPORT SPINAL ORTHOSIS) MISC Wear upper back (thoracic) support brace daily. 04/18/18  Yes [provider]  esomeprazole (NEXIUM) 20 MG capsule TAKE 1 TO 2 CAPSULES(20 TO 40 MG) BY MOUTH EVERY MORNING 10/23/18  Yes Sowles, Drue Stager, MD  Multiple Vitamins-Calcium (ESSENTIAL ONE DAILY MULTIVIT) TABS Take by mouth.   Yes [provider]  Omega-3 1000 MG CAPS Take 1 capsule by mouth 2 (two) times daily.   Yes [provider]  citalopram (CELEXA) 10 MG tablet Take 1 tablet (10 mg total) by mouth daily. Patient not taking: Reported on 11/15/2018 10/19/18   Steele Sizer, MD  nitrofurantoin (MACRODANTIN) 100 MG capsule Take 100 mg by mouth as needed.      Murrell Redden, MD    Family History  Problem Relation Age of Onset  . Breast cancer Mother   . Diabetes Father   . CAD Father   . Breast cancer Unknown        Niece  . Cirrhosis Brother      Social History   Tobacco Use  . Smoking status: Never Smoker  . Smokeless tobacco: Never Used  Substance Use Topics  . Alcohol use: No    Alcohol/week: 0.0 standard drinks  . Drug use: No    Allergies as of 11/15/2018  . (No Known Allergies)    Review of  Systems:    All systems reviewed and negative except where noted in HPI.   Physical Exam:  BP 127/75   Pulse 70   Ht 5\' 2"  (1.575 m)   Wt 177 lb (80.3 kg)   BMI 32.37 kg/m  No LMP recorded. Patient has had a hysterectomy. General:   Alert,  Well-developed, well-nourished, pleasant and cooperative in NAD Head:  Normocephalic and atraumatic. Eyes:  Sclera clear, no icterus.   Conjunctiva pink. Ears:  Normal auditory acuity. Nose:  No deformity, discharge, or lesions. Mouth:  No deformity or lesions,oropharynx pink & moist. Neck:  Supple; no masses or thyromegaly. Lungs:  Respirations even and unlabored.  Clear throughout to auscultation.   No wheezes, crackles, or rhonchi. No acute distress. Heart:  Regular rate and rhythm; no murmurs, clicks, rubs, or gallops. Abdomen:  Normal bowel sounds.  No bruits.  Soft, non-tender and non-distended without masses, hepatosplenomegaly or hernias noted.  No guarding or rebound tenderness.  Negative Carnett sign.   Rectal:  Deferred.  Msk:  Symmetrical without gross deformities.  Good, equal movement & strength bilaterally. Pulses:  Normal pulses noted. Extremities:  No clubbing or edema.  No cyanosis. Neurologic:  Alert and oriented x3;  grossly normal neurologically. Skin:  Intact without significant lesions or rashes.  No jaundice. Lymph Nodes:  No significant cervical adenopathy. Psych:  Alert and cooperative. Normal mood and affect.  Imaging Studies: No results found.  Assessment and Plan:   Christina Salas is a 84 y.o. y/o female who comes in today with a history of iron deficiency anemia with iron studies being low this year and last year.  The patient has a history of GERD but denies any dysphasia.  She is now being managed well on her PPI.  The patient has been explained that the work-up of a patient with iron deficiency anemia would include an upper endoscopy and colonoscopy.  The patient is also been given the option of just taking  iron and seeing if her labs improve.  The patient reports that that would not give her an answer of what is causing the anemia and just mask it.  The patient would like to proceed with a GI work-up which will include an EGD and colonoscopy. I have discussed risks & benefits which include, but are not limited to, bleeding, infection, perforation & drug reaction.  The patient agrees with this plan & written consent will be obtained.     Lucilla Lame, MD. Marval Regal    Note: This dictation was prepared with Dragon dictation along with smaller phrase technology. Any transcriptional errors  that result from this process are unintentional.

## 2018-11-16 ENCOUNTER — Other Ambulatory Visit: Payer: Self-pay

## 2018-11-16 DIAGNOSIS — D500 Iron deficiency anemia secondary to blood loss (chronic): Secondary | ICD-10-CM

## 2018-12-06 ENCOUNTER — Ambulatory Visit: Payer: Medicare Other

## 2018-12-06 ENCOUNTER — Ambulatory Visit: Payer: Medicare Other | Admitting: Family Medicine

## 2018-12-27 ENCOUNTER — Ambulatory Visit
Admission: RE | Admit: 2018-12-27 | Discharge: 2018-12-27 | Disposition: A | Discharge status: Home / Self Care | Payer: Medicare Other | Attending: Gastroenterology | Admitting: Gastroenterology

## 2018-12-27 ENCOUNTER — Ambulatory Visit: Payer: Medicare Other | Admitting: Certified Registered"

## 2018-12-27 ENCOUNTER — Encounter
Admission: RE | Disposition: A | Discharge status: Home / Self Care | Payer: Self-pay | Source: Home / Self Care | Attending: Gastroenterology

## 2018-12-27 ENCOUNTER — Encounter: Payer: Self-pay | Admitting: *Deleted

## 2018-12-27 DIAGNOSIS — R7303 Prediabetes: Secondary | ICD-10-CM | POA: Insufficient documentation

## 2018-12-27 DIAGNOSIS — M1990 Unspecified osteoarthritis, unspecified site: Secondary | ICD-10-CM | POA: Diagnosis not present

## 2018-12-27 DIAGNOSIS — E559 Vitamin D deficiency, unspecified: Secondary | ICD-10-CM | POA: Diagnosis not present

## 2018-12-27 DIAGNOSIS — Z853 Personal history of malignant neoplasm of breast: Secondary | ICD-10-CM | POA: Diagnosis not present

## 2018-12-27 DIAGNOSIS — Z8249 Family history of ischemic heart disease and other diseases of the circulatory system: Secondary | ICD-10-CM | POA: Diagnosis not present

## 2018-12-27 DIAGNOSIS — D509 Iron deficiency anemia, unspecified: Secondary | ICD-10-CM | POA: Insufficient documentation

## 2018-12-27 DIAGNOSIS — K449 Diaphragmatic hernia without obstruction or gangrene: Secondary | ICD-10-CM | POA: Diagnosis not present

## 2018-12-27 DIAGNOSIS — Z8379 Family history of other diseases of the digestive system: Secondary | ICD-10-CM | POA: Insufficient documentation

## 2018-12-27 DIAGNOSIS — Z90710 Acquired absence of both cervix and uterus: Secondary | ICD-10-CM | POA: Diagnosis not present

## 2018-12-27 DIAGNOSIS — Z79899 Other long term (current) drug therapy: Secondary | ICD-10-CM | POA: Insufficient documentation

## 2018-12-27 DIAGNOSIS — Z833 Family history of diabetes mellitus: Secondary | ICD-10-CM | POA: Insufficient documentation

## 2018-12-27 DIAGNOSIS — D500 Iron deficiency anemia secondary to blood loss (chronic): Secondary | ICD-10-CM | POA: Diagnosis not present

## 2018-12-27 DIAGNOSIS — Z87440 Personal history of urinary (tract) infections: Secondary | ICD-10-CM | POA: Diagnosis not present

## 2018-12-27 DIAGNOSIS — R51 Headache: Secondary | ICD-10-CM | POA: Insufficient documentation

## 2018-12-27 DIAGNOSIS — K219 Gastro-esophageal reflux disease without esophagitis: Secondary | ICD-10-CM | POA: Insufficient documentation

## 2018-12-27 DIAGNOSIS — Z85820 Personal history of malignant melanoma of skin: Secondary | ICD-10-CM | POA: Insufficient documentation

## 2018-12-27 DIAGNOSIS — K641 Second degree hemorrhoids: Secondary | ICD-10-CM | POA: Insufficient documentation

## 2018-12-27 DIAGNOSIS — Z803 Family history of malignant neoplasm of breast: Secondary | ICD-10-CM | POA: Insufficient documentation

## 2018-12-27 DIAGNOSIS — M810 Age-related osteoporosis without current pathological fracture: Secondary | ICD-10-CM | POA: Diagnosis not present

## 2018-12-27 HISTORY — PX: COLONOSCOPY WITH PROPOFOL: SHX5780

## 2018-12-27 HISTORY — DX: Prediabetes: R73.03

## 2018-12-27 HISTORY — PX: ESOPHAGOGASTRODUODENOSCOPY (EGD) WITH PROPOFOL: SHX5813

## 2018-12-27 SURGERY — COLONOSCOPY WITH PROPOFOL
Anesthesia: General

## 2018-12-27 MED ORDER — SODIUM CHLORIDE 0.9 % IV SOLN
INTRAVENOUS | Status: DC
  Administered 2018-12-27: 1000 mL via INTRAVENOUS

## 2018-12-27 MED ORDER — PROPOFOL 500 MG/50ML IV EMUL
INTRAVENOUS | Status: DC | PRN
  Administered 2018-12-27: 150 ug/kg/min via INTRAVENOUS

## 2018-12-27 MED ORDER — PROPOFOL 10 MG/ML IV BOLUS
INTRAVENOUS | Status: DC | PRN
  Administered 2018-12-27: 40 mg via INTRAVENOUS

## 2018-12-27 MED ORDER — PROPOFOL 10 MG/ML IV BOLUS
INTRAVENOUS | Status: AC
  Filled 2018-12-27: qty 40

## 2018-12-27 NOTE — Anesthesia Post-op Follow-up Note (Signed)
Anesthesia QCDR form completed.        

## 2018-12-27 NOTE — H&P (Signed)
Lucilla Lame, MD [address] [place], [place] [address] Phone:[phone] Fax : [phone]  Primary Care Physician:  Steele Sizer, MD Primary Gastroenterologist:  Dr. Allen Norris  Pre-Procedure History & Physical: HPI:  Christina Salas is a 84 y.o. female is here for an endoscopy and colonoscopy.   Past Medical History:  Diagnosis Date  . Abnormal glucose   . Arthritis   . Breast cancer ([place])   . Cancer ([place])    melanoma  . Chronic headaches   . GERD (gastroesophageal reflux disease)   . H/O melanoma excision   . History of diverticulitis   . History of recurrent UTIs   . Osteoporosis    pre-osetoporosis per patient history form 03/18/12  . Pre-diabetes   . Vitamin D deficiency     Past Surgical History:  Procedure Laterality Date  . ABDOMINAL HYSTERECTOMY  2001  . BREAST BIOPSY  2010  . BREAST EXCISIONAL BIOPSY Right 1997   Benign excisional biopsy   . COLONOSCOPY  04/02/2014  . CYSTOSCOPY  11/2014  . MASTECTOMY  1996   Left  . [place]    Prior to Admission medications   Medication Sig Start Date End Date Taking? Authorizing Provider  Calcium-Magnesium 500-250 MG TABS Take 2 tablets by mouth 2 (two) times daily.   Yes [provider]  Cholecalciferol (VITAMIN D3) 1000 units CAPS Take 1 capsule by mouth daily.    Yes [provider]  Cinnamon 500 MG capsule Take 1 capsule by mouth daily.   Yes [provider]  Coenzyme Q10 (COQ10) 30 MG CAPS Take 1 capsule by mouth every other day.   Yes [provider]  Cranberry ([employer] CRANBERRY) 500 MG CAPS Take 1 tablet by mouth daily.   Yes [provider]  esomeprazole (NEXIUM) 20 MG capsule TAKE 1 TO 2 CAPSULES(20 TO 40 MG) BY MOUTH EVERY MORNING 10/23/18  Yes Sowles, Drue Stager, MD  Multiple Vitamins-Calcium (ESSENTIAL ONE DAILY MULTIVIT) TABS Take by mouth.   Yes [provider]  Omega-3 1000 MG CAPS Take 1 capsule by mouth 2 (two)  times daily.   Yes [provider]  butalbital-acetaminophen-caffeine (FIORICET, ESGIC) 50-325-40 MG tablet Take 1 tablet by mouth every 6 (six) hours as needed (as needed). 04/19/18   Poulose, Bethel Born, NP  citalopram (CELEXA) 10 MG tablet Take 1 tablet (10 mg total) by mouth daily. Patient not taking: Reported on 11/15/2018 10/19/18   Steele Sizer, MD  Elastic Bandages & Supports (ULTRA SUPPORT SPINAL ORTHOSIS) MISC Wear upper back (thoracic) support brace daily. 04/18/18   [provider]  nitrofurantoin (MACRODANTIN) 100 MG capsule Take 100 mg by mouth as needed.      Murrell Redden, MD    Allergies as of 11/16/2018  . (No Known Allergies)    Family History  Problem Relation Age of Onset  . Breast cancer Mother   . Diabetes Father   . CAD Father   . Breast cancer Other        Niece  . Cirrhosis Brother     Social History   Socioeconomic History  . Marital status: Single    Spouse name: Not on file  . Number of children: 0  . Years of education: Not on file  . Highest education level: Not on file  Occupational History  . Occupation: Retired  Scientific laboratory technician  . Financial resource strain: Not on file  . Food insecurity:  Worry: Not on file    Inability: Not on file  . Transportation needs:    Medical: Not on file    Non-medical: Not on file  Tobacco Use  . Smoking status: Never Smoker  . Smokeless tobacco: Never Used  Substance and Sexual Activity  . Alcohol use: No    Alcohol/week: 0.0 standard drinks  . Drug use: Never  . Sexual activity: Not Currently  Lifestyle  . Physical activity:    Days per week: Not on file    Minutes per session: Not on file  . Stress: Not on file  Relationships  . Social connections:    Talks on phone: Not on file    Gets together: Not on file    Attends religious service: Not on file    Active member of club or organization: Not on file    Attends meetings of clubs or organizations: Not on file     Relationship status: Not on file  . Intimate partner violence:    Fear of current or ex partner: Not on file    Emotionally abused: Not on file    Physically abused: Not on file    Forced sexual activity: Not on file  Other Topics Concern  . 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 Neck:  Supple; no masses or thyromegaly. Lungs:  Clear throughout to auscultation.    Heart:  Regular rate and rhythm. Abdomen:  Soft, nontender and nondistended. Normal bowel sounds, without guarding, and without rebound.   Neurologic:  Alert and  oriented x4;  grossly normal neurologically.  Impression/Plan: Christina Salas is here for an endoscopy and colonoscopy to be performed for IDA  Risks, benefits, limitations, and alternatives regarding  endoscopy and colonoscopy have been reviewed with the patient.  Questions have been answered.  All parties agreeable.   Lucilla Lame, MD  12/27/2018, 8:01 AM

## 2018-12-27 NOTE — Anesthesia Postprocedure Evaluation (Signed)
Anesthesia Post Note  Patient: Christina Salas  Procedure(s) Performed: COLONOSCOPY WITH PROPOFOL (N/A ) ESOPHAGOGASTRODUODENOSCOPY (EGD) WITH PROPOFOL (N/A )  Patient location during evaluation: Endoscopy Anesthesia Type: General Level of consciousness: awake and alert and oriented Pain management: pain level controlled Vital Signs Assessment: post-procedure vital signs reviewed and stable Respiratory status: spontaneous breathing, nonlabored ventilation and respiratory function stable Cardiovascular status: blood pressure returned to baseline and stable Postop Assessment: no signs of nausea or vomiting Anesthetic complications: no     Last Vitals:  Vitals:   12/27/18 0919 12/27/18 0929  BP: (!) 172/61 (!) 153/66  Pulse: 62 64  Resp: 15 18  Temp:    SpO2: 100% 100%    Last Pain:  Vitals:   12/27/18 0929  TempSrc:   PainSc: 0-No pain                 Daneli Butkiewicz

## 2018-12-27 NOTE — Op Note (Signed)
[facility] Gastroenterology Patient Name: Christina Salas Procedure Date: 12/27/2018 8:09 AM MRN: [id number] Account #: [id number] Date of Birth: 09-Sep-1933 Admit Type: Outpatient Age: 84 Room: [facility] ENDO ROOM 4 Gender: Female Note Status: Finalized Procedure:            Colonoscopy Indications:          Iron deficiency anemia Providers:            Lucilla Lame MD, MD Referring MD:         Bethena Roys. Sowles, MD (Referring MD) Medicines:            Propofol per Anesthesia Complications:        No immediate complications. Procedure:            Pre-Anesthesia Assessment:                       - Prior to the procedure, a History and Physical was                        performed, and patient medications and allergies were                        reviewed. The patient's tolerance of previous                        anesthesia was also reviewed. The risks and benefits of                        the procedure and the sedation options and risks were                        discussed with the patient. All questions were                        answered, and informed consent was obtained. Prior                        Anticoagulants: The patient has taken no previous                        anticoagulant or antiplatelet agents. ASA Grade                        Assessment: III - A patient with severe systemic                        disease. After reviewing the risks and benefits, the                        patient was deemed in satisfactory condition to undergo                        the procedure.                       After obtaining informed consent, the colonoscope was                        passed under direct vision. Throughout the procedure,  the patient's blood pressure, pulse, and oxygen                        saturations were monitored continuously. The                        Colonoscope was introduced through the anus and   advanced to the the cecum, identified by appendiceal                        orifice and ileocecal valve. The colonoscopy was                        performed without difficulty. The patient tolerated the                        procedure well. The quality of the bowel preparation                        was good. Findings:      The perianal and digital rectal examinations were normal.      Non-bleeding internal hemorrhoids were found during retroflexion. The       hemorrhoids were Grade II (internal hemorrhoids that prolapse but reduce       spontaneously). Impression:           - Non-bleeding internal hemorrhoids.                       - No specimens collected. Recommendation:       - Discharge patient to home.                       - Resume previous diet.                       - Continue present medications.                       - To visualize the small bowel, perform video capsule                        endoscopy. Procedure Code(s):    --- Professional ---                       [id number], Colonoscopy, flexible; diagnostic, including                        collection of specimen(s) by brushing or washing, when                        performed (separate procedure) Diagnosis Code(s):    --- Professional ---                       D50.9, Iron deficiency anemia, unspecified CPT copyright 2018 American Medical Association. All rights reserved. The codes documented in this report are preliminary and upon coder review may  be revised to meet current compliance requirements. Lucilla Lame MD, MD 12/27/2018 8:48:39 AM This report has been signed electronically. Number of Addenda: 0 Note Initiated On: 12/27/2018 8:09 AM Scope Withdrawal Time: 0 hours 7 minutes 9 seconds  Total Procedure Duration: 0 hours 21 minutes 19 seconds  [facility]

## 2018-12-27 NOTE — Transfer of Care (Signed)
Immediate Anesthesia Transfer of Care Note  Patient: Christina Salas  Procedure(s) Performed: COLONOSCOPY WITH PROPOFOL (N/A ) ESOPHAGOGASTRODUODENOSCOPY (EGD) WITH PROPOFOL (N/A )  Patient Location: Endoscopy Unit  Anesthesia Type:General  Level of Consciousness: awake, alert  and oriented  Airway & Oxygen Therapy: Patient Spontanous Breathing and Patient connected to nasal cannula oxygen  Post-op Assessment: Report given to RN and Post -op Vital signs reviewed and stable  Post vital signs: Reviewed and stable  Last Vitals:  Vitals Value Taken Time  BP    Temp    Pulse    Resp    SpO2      Last Pain:  Vitals:   12/27/18 0747  TempSrc: Tympanic  PainSc: 0-No pain         Complications: No apparent anesthesia complications

## 2018-12-27 NOTE — Op Note (Addendum)
[facility] Gastroenterology Patient Name: Christina Salas Procedure Date: 12/27/2018 8:09 AM MRN: [id number] Account #: [id number] Date of Birth: 1933/04/28 Admit Type: Outpatient Age: 84 Room: [facility] ENDO ROOM 4 Gender: Female Note Status: Finalized Procedure:            Upper GI endoscopy Indications:          Iron deficiency anemia Providers:            Lucilla Lame MD, MD Referring MD:         Bethena Roys. Sowles, MD (Referring MD) Medicines:            Propofol per Anesthesia Complications:        No immediate complications. Procedure:            Pre-Anesthesia Assessment:                       - Prior to the procedure, a History and Physical was                        performed, and patient medications and allergies were                        reviewed. The patient's tolerance of previous                        anesthesia was also reviewed. The risks and benefits of                        the procedure and the sedation options and risks were                        discussed with the patient. All questions were                        answered, and informed consent was obtained. Prior                        Anticoagulants: The patient has taken no previous                        anticoagulant or antiplatelet agents. ASA Grade                        Assessment: III - A patient with severe systemic                        disease. After reviewing the risks and benefits, the                        patient was deemed in satisfactory condition to undergo                        the procedure.                       After obtaining informed consent, the endoscope was                        passed under direct vision. Throughout the procedure,  the patient's blood pressure, pulse, and oxygen                        saturations were monitored continuously. The Endoscope                        was introduced through the mouth, and advanced to the                     second part of duodenum. The upper GI endoscopy was                        accomplished without difficulty. The patient tolerated                        the procedure well. Findings:      A large hiatal hernia was present.      The stomach was normal.      The examined duodenum was normal. Impression:           - Large hiatal hernia.                       - Normal stomach.                       - Normal examined duodenum.                       - No specimens collected. Recommendation:       - Discharge patient to home.                       - Resume previous diet.                       - Continue present medications.                       - Perform a colonoscopy today. Lucilla Lame MD, MD 12/27/2018 8:23:01 AM This report has been signed electronically. Number of Addenda: 0 Note Initiated On: 12/27/2018 8:09 AM      [facility]

## 2018-12-27 NOTE — Anesthesia Preprocedure Evaluation (Signed)
Anesthesia Evaluation  Patient identified by MRN, date of birth, ID band Patient awake    Reviewed: Allergy & Precautions, NPO status , Patient's Chart, lab work & pertinent test results  History of Anesthesia Complications Negative for: history of anesthetic complications  Airway Mallampati: II  TM Distance: >3 FB Neck ROM: Full    Dental no notable dental hx.    Pulmonary neg pulmonary ROS, neg sleep apnea, neg COPD,    breath sounds clear to auscultation- rhonchi (-) wheezing      Cardiovascular Exercise Tolerance: Good (-) hypertension(-) CAD, (-) Past MI, (-) Cardiac Stents and (-) CABG  Rhythm:Regular Rate:Normal - Systolic murmurs and - Diastolic murmurs    Neuro/Psych  Headaches, negative psych ROS   GI/Hepatic Neg liver ROS, GERD  ,  Endo/Other  negative endocrine ROSneg diabetes  Renal/GU negative Renal ROS     Musculoskeletal  (+) Arthritis ,   Abdominal (+) - obese,   Peds  Hematology negative hematology ROS (+)   Anesthesia Other Findings Past Medical History: No date: Abnormal glucose No date: Arthritis No date: Breast cancer (HCC) No date: Cancer ([place])     Comment:  melanoma No date: Chronic headaches No date: GERD (gastroesophageal reflux disease) No date: H/O melanoma excision No date: History of diverticulitis No date: History of recurrent UTIs No date: Osteoporosis     Comment:  pre-osetoporosis per patient history form 03/18/12 No date: Pre-diabetes No date: Vitamin D deficiency   Reproductive/Obstetrics                             Anesthesia Physical Anesthesia Plan  ASA: II  Anesthesia Plan: General   Post-op Pain Management:    Induction: Intravenous  PONV Risk Score and Plan: 2 and Propofol infusion  Airway Management Planned: Natural Airway  Additional Equipment:   Intra-op Plan:   Post-operative Plan:   Informed Consent: I have reviewed  the patients History and Physical, chart, labs and discussed the procedure including the risks, benefits and alternatives for the proposed anesthesia with the patient or authorized representative who has indicated his/her understanding and acceptance.   Dental advisory given  Plan Discussed with: CRNA and Anesthesiologist  Anesthesia Plan Comments:         Anesthesia Quick Evaluation

## 2018-12-29 ENCOUNTER — Encounter: Payer: Self-pay | Admitting: Gastroenterology

## 2019-01-24 ENCOUNTER — Ambulatory Visit (INDEPENDENT_AMBULATORY_CARE_PROVIDER_SITE_OTHER): Payer: Medicare Other

## 2019-01-24 ENCOUNTER — Ambulatory Visit: Payer: Medicare Other | Admitting: Family Medicine

## 2019-01-24 ENCOUNTER — Ambulatory Visit: Payer: Medicare Other

## 2019-01-24 ENCOUNTER — Encounter: Payer: Self-pay | Admitting: Family Medicine

## 2019-01-24 VITALS — BP 122/72 | HR 70 | Temp 97.7°F | Resp 16 | Ht 62.0 in | Wt 176.3 lb

## 2019-01-24 DIAGNOSIS — Z0000 Encounter for general adult medical examination without abnormal findings: Secondary | ICD-10-CM | POA: Diagnosis not present

## 2019-01-24 DIAGNOSIS — D508 Other iron deficiency anemias: Secondary | ICD-10-CM

## 2019-01-24 DIAGNOSIS — G44209 Tension-type headache, unspecified, not intractable: Secondary | ICD-10-CM

## 2019-01-24 DIAGNOSIS — I208 Other forms of angina pectoris: Secondary | ICD-10-CM | POA: Diagnosis not present

## 2019-01-24 DIAGNOSIS — F32A Depression, unspecified: Secondary | ICD-10-CM | POA: Insufficient documentation

## 2019-01-24 DIAGNOSIS — K449 Diaphragmatic hernia without obstruction or gangrene: Secondary | ICD-10-CM | POA: Diagnosis not present

## 2019-01-24 DIAGNOSIS — Z780 Asymptomatic menopausal state: Secondary | ICD-10-CM

## 2019-01-24 DIAGNOSIS — M2352 Chronic instability of knee, left knee: Secondary | ICD-10-CM

## 2019-01-24 DIAGNOSIS — N390 Urinary tract infection, site not specified: Secondary | ICD-10-CM

## 2019-01-24 DIAGNOSIS — M25462 Effusion, left knee: Secondary | ICD-10-CM

## 2019-01-24 DIAGNOSIS — K648 Other hemorrhoids: Secondary | ICD-10-CM

## 2019-01-24 DIAGNOSIS — F320 Major depressive disorder, single episode, mild: Secondary | ICD-10-CM

## 2019-01-24 DIAGNOSIS — Z1231 Encounter for screening mammogram for malignant neoplasm of breast: Secondary | ICD-10-CM

## 2019-01-24 DIAGNOSIS — K219 Gastro-esophageal reflux disease without esophagitis: Secondary | ICD-10-CM

## 2019-01-24 DIAGNOSIS — E781 Pure hyperglyceridemia: Secondary | ICD-10-CM

## 2019-01-24 MED ORDER — BUTALBITAL-APAP-CAFFEINE 50-325-40 MG PO TABS: 1.0000 | ORAL_TABLET | Freq: Four times a day (QID) | ORAL | 0 refills | Status: DC | PRN

## 2019-01-24 MED ORDER — ESOMEPRAZOLE MAGNESIUM 20 MG PO CPDR: 20.0000 mg | DELAYED_RELEASE_CAPSULE | Freq: Every day | ORAL | 1 refills | Status: DC

## 2019-01-24 NOTE — Progress Notes (Signed)
Subjective:   Christina Salas is a 85 y.o. female who presents for Medicare Annual (Subsequent) preventive examination.  Review of Systems:   Cardiac Risk Factors include: advanced age (>66men, >44 women);obesity (BMI >30kg/m2);dyslipidemia     Objective:     Vitals: BP 122/72 (BP Location: Right Arm, Patient Position: Sitting, Cuff Size: Large)   Pulse 70   Temp 97.7 F (36.5 C) (Oral)   Resp 16   Ht 5\' 2"  (1.575 m)   Wt 176 lb 4.8 oz (80 kg)   SpO2 97%   BMI 32.25 kg/m   Body mass index is 32.25 kg/m.  Advanced Directives 01/24/2019 12/27/2018 07/13/2017 04/14/2017 01/21/2017 12/16/2016 09/28/2016  Does Patient Have a Medical Advance Directive? No No No Yes No Yes No  Type of Advance Directive - - - Living will - [place] in Chart? - - - - - No - copy requested -  Would patient like information on creating a medical advance directive? Yes (MAU/Ambulatory/Procedural Areas - Information given) - - - - - No - patient declined information    Tobacco Social History   Tobacco Use  Smoking Status Never Smoker  Smokeless Tobacco Never Used     Counseling given: Not Answered   Clinical Intake:  Pre-visit preparation completed: Yes  Pain : 0-10 Pain Score: 5  Pain Type: Acute pain Pain Location: Head(headache) Pain Descriptors / Indicators: Aching Pain Onset: More than a month ago Pain Frequency: Intermittent Pain Relieving Factors: excedrin  Pain Relieving Factors: excedrin  BMI - recorded: 32.25 Nutritional Status: BMI > 30  Obese Nutritional Risks: None Diabetes: No(prediabetes)  How often do you need to have someone help you when you read instructions, pamphlets, or other written materials from your doctor or pharmacy?: 1 - Never What is the last grade level you completed in school?: post graduate school  Interpreter Needed?: No  Information entered by :: [name] LPN  Past Medical History:   Diagnosis Date  . Abnormal glucose   . Anxiety   . Arthritis   . Breast cancer ([place])   . Cancer ([place])    melanoma  . Chronic headaches   . Depression   . GERD (gastroesophageal reflux disease)   . H/O melanoma excision   . History of diverticulitis   . History of recurrent UTIs   . Osteoporosis    pre-osetoporosis per patient history form 03/18/12  . Pre-diabetes   . Vitamin D deficiency    Past Surgical History:  Procedure Laterality Date  . ABDOMINAL HYSTERECTOMY  2001  . BREAST BIOPSY  2010  . BREAST EXCISIONAL BIOPSY Right 1997   Benign excisional biopsy   . COLONOSCOPY  04/02/2014  . COLONOSCOPY WITH PROPOFOL N/A 12/27/2018   Procedure: COLONOSCOPY WITH PROPOFOL;  Surgeon: Lucilla Lame, MD;  Location[facility] ENDOSCOPY;  Service: Endoscopy;  Laterality: N/A;  . CYSTOSCOPY  11/2014  . ESOPHAGOGASTRODUODENOSCOPY (EGD) WITH PROPOFOL N/A 12/27/2018   Procedure: ESOPHAGOGASTRODUODENOSCOPY (EGD) WITH PROPOFOL;  Surgeon: Lucilla Lame, MD;  Location: [facility] ENDOSCOPY;  Service: Endoscopy;  Laterality: N/A;  . MASTECTOMY  1996   Left  . MELANOMA EXCISION  1995   Head   Family History  Problem Relation Age of Onset  . Breast cancer Mother   . Migraines Mother   . Diabetes Father   . CAD Father   . Thrombosis Father   . Biliary Cirrhosis Brother   . Breast cancer Other  Niece   Social History   Socioeconomic History  . Marital status: Single    Spouse name: Not on file  . Number of children: 0  . Years of education: Not on file  . Highest education level: Master's degree (e.g., MA, MS, MEng, MEd, MSW, MBA)  Occupational History  . Occupation: Retired  Scientific laboratory technician  . Financial resource strain: Not hard at all  . Food insecurity:    Worry: Never true    Inability: Never true  . Transportation needs:    Medical: No    Non-medical: No  Tobacco Use  . Smoking status: Never Smoker  . Smokeless tobacco: Never Used  Substance and Sexual Activity  . Alcohol  use: No    Alcohol/week: 0.0 standard drinks  . Drug use: Never  . Sexual activity: Not Currently  Lifestyle  . Physical activity:    Days per week: 0 days    Minutes per session: 0 min  . Stress: Only a little  Relationships  . Social connections:    Talks on phone: More than three times a week    Gets together: Three times a week    Attends religious service: More than 4 times per year    Active member of club or organization: No    Attends meetings of clubs or organizations: Never    Relationship status: Never married  Other Topics Concern  . 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 Calcium-Magnesium 500-250 MG TABS Take 2 tablets by mouth 2 (two) times daily.  . Cholecalciferol (VITAMIN D3) 1000 units CAPS Take 1 capsule by mouth daily.   . Cinnamon 500 MG capsule Take 1 capsule by mouth daily.  . citalopram (CELEXA) 10 MG tablet Take 1 tablet (10 mg total) by mouth daily. (Patient not taking: Reported on 01/24/2019)  . Coenzyme Q10 (COQ10) 30 MG CAPS Take 1 capsule by mouth every other day.  . Cranberry ([employer] CRANBERRY) 500 MG CAPS Take 1 tablet by mouth daily.  Water engineer Bandages & Supports (ULTRA SUPPORT SPINAL ORTHOSIS) MISC Wear upper back (thoracic) support brace daily.  [name] esomeprazole (NEXIUM) 20 MG capsule Take 1 capsule (20 mg total) by mouth daily at 12 noon.  . Multiple Vitamins-Calcium (ESSENTIAL ONE DAILY MULTIVIT) TABS Take by mouth.  . nitrofurantoin (MACRODANTIN) 100 MG capsule Take 100 mg by mouth as needed.    . Omega-3 1000 MG CAPS Take 1 capsule by mouth 2 (two) times daily.   No facility-administered encounter medications on file as of 01/24/2019.     Activities of Daily Living In your present state of health, do you have any difficulty performing the following  activities: 01/24/2019 10/19/2018  Hearing? Tempie Donning  Comment wears hearing aids sometimes hearing aid in bilateral ears  Vision? N Y  Comment wears glasses -  Difficulty concentrating or making decisions? N N  Walking or climbing stairs? Y N  Comment swollen left knee -  Dressing or bathing? N N  Doing errands, shopping? N N  Preparing Food and eating ? N -  Using the Toilet? N -  In the past six months, have you accidently leaked urine? Y -  Comment wears pads for protection -  Do you have problems with loss  of bowel control? N -  Managing your Medications? N -  Managing your Finances? N -  Housekeeping or managing your Housekeeping? N -  Some recent data might be hidden    Patient Care Team: Steele Sizer, MD as PCP - General (Family Medicine) Murrell Redden, MD as Consulting Physician (Urology) Dew, Erskine Squibb, MD as Consulting Physician (Vascular Surgery) Yolonda Kida, MD as Consulting Physician (Cardiology) Troxler, Adele Schilder as Attending Physician (Podiatry) Luberta Mutter, MD as Consulting Physician (Ophthalmology) Ralene Bathe, MD as Consulting Physician (Dermatology)    Assessment:   This is a routine wellness examination for Christina Salas.  Exercise Activities and Dietary recommendations Current Exercise Habits: The patient does not participate in regular exercise at present, Exercise limited by: orthopedic condition(s)  Goals    . Patient Stated     Pt has goals for herself to complete projects at home and organize pictures and family history information.        Fall Risk Fall Risk  01/24/2019 01/24/2019 10/19/2018 10/25/2017 04/14/2017  Falls in the past year? 0 0 No No Yes  Number falls in past yr: - 0 - - 1  Injury with Fall? - - - - No   FALL RISK PREVENTION PERTAINING TO THE HOME:  Any stairs in or around the home WITH handrails? Yes  - split level home 1 step no rails Home free of loose throw rugs in walkways, pet beds, electrical cords, etc?  Yes  Adequate lighting in your home to reduce risk of falls? Yes   ASSISTIVE DEVICES UTILIZED TO PREVENT FALLS:  Life alert? No  Use of a cane, [place] or w/c? No  Grab bars in the bathroom? Yes  Shower chair or bench in shower? Yes  Elevated toilet seat or a handicapped toilet? Yes   DME ORDERS:  DME order needed?  No   TIMED UP AND GO:  Was the test performed? Yes .  Length of time to ambulate 10 feet: 7 sec.   GAIT:  Appearance of gait: Gait stead-fast and without the use of an assistive device. Education: Fall risk prevention has been discussed.  Intervention(s) required? No   Depression Screen PHQ 2/9 Scores 01/24/2019 10/19/2018 10/25/2017 04/14/2017  PHQ - 2 Score 1 2 0 0  PHQ- 9 Score 6 7 - -  Exception Documentation - - - -  Not completed - - - -     Cognitive Function        Immunization History  Administered Date(s) Administered  . Influenza Split 09/10/2007, 11/13/2008, 09/27/2009, 10/17/2010  . Influenza, High Dose Seasonal PF 10/21/2015, 09/28/2016, 10/25/2017, 10/19/2018  . Influenza, Seasonal, Injecte, Preservative Fre 10/30/2011, 10/21/2012  . Influenza,inj,Quad PF,6+ Mos 09/06/2013, 10/19/2014  . Pneumococcal Conjugate-13 12/03/2014  . Pneumococcal Polysaccharide-23 06/15/2012  . Tdap 12/03/2010    Qualifies for Shingles Vaccine? Yes  Zostavax completed per patient several years ago at [facility]. Due for Shingrix. Education has been provided regarding the importance of this vaccine. Pt has been advised to call insurance company to determine out of pocket expense. Advised may also receive vaccine at local pharmacy or Health Dept. Verbalized acceptance and understanding.  Tdap: Up to date  Flu Vaccine: Up to date  Pneumococcal Vaccine: Up to date  Screening Tests Health Maintenance  Topic Date Due  . TETANUS/TDAP  12/03/2020  . INFLUENZA VACCINE  Completed  . DEXA SCAN  Completed  . PNA vac Low Risk Adult  Completed    Cancer  Screenings:  Colorectal Screening: Completed 12/27/18. No longer required. Pt of Dr. Allen Norris   Mammogram: Completed 05/11/18. Repeat every year; Ordered today. Pt provided with contact information and advised to call to schedule appt.   Bone Density: Completed 10/17/13. Results reflect OSTEOPENIA, Repeat every 2 years. Ordered today. Pt provided with contact information and advised to call to schedule appt.   Lung Cancer Screening: (Low Dose CT Chest recommended if Age 23-80 years, 30 pack-year currently smoking OR have quit w/in 15years.) does not qualify.    Additional Screening:  Hepatitis C Screening: no longer required  Vision Screening: Recommended annual ophthalmology exams for early detection of glaucoma and other disorders of the eye. Is the patient up to date with their annual eye exam?  Yes  Who is the provider or what is the name of the office in which the pt attends annual eye exams? Dr. [facility]   Dental Screening: Recommended annual dental exams for proper oral hygiene  Community Resource Referral:  CRR required this visit?  No      Plan:    I have personally reviewed and addressed the Medicare Annual Wellness questionnaire and have noted the following in the patient's chart:  A. Medical and social history B. Use of alcohol, tobacco or illicit drugs  C. Current medications and supplements D. Functional ability and status E.  Nutritional status F.  Physical activity G. Advance directives H. List of other physicians I.  Hospitalizations, surgeries, and ER visits in previous 12 months J.  [place] such as hearing and vision if needed, cognitive and depression L. Referrals and appointments   In addition, I have reviewed and discussed with patient certain preventive protocols, quality metrics, and best practice recommendations. A written personalized care plan for preventive services as well as general preventive health recommendations  were provided to patient.   Signed,  [name], LPN Nurse Health Advisor   Nurse Notes: Pt doing well, also seen by Dr. Ancil Boozer today for depression f/u.

## 2019-01-24 NOTE — Patient Instructions (Addendum)
Christina Salas , Thank you for taking time to come for your Medicare Wellness Visit. I appreciate your ongoing commitment to your health goals. Please review the following plan we discussed and let me know if I can assist you in the future.   Screening recommendations/referrals: Colonoscopy: done 12/27/18 Mammogram: done 05/11/18. Please call [phone] to schedule your mammogram and bone density exam.  Bone Density: 10/17/13  Recommended yearly ophthalmology/optometry visit for glaucoma screening and checkup Recommended yearly dental visit for hygiene and checkup  Vaccinations: Influenza vaccine: done 10/19/18 Pneumococcal vaccine: done 12/03/14 Tdap vaccine: 12/03/10 Shingles vaccine: Shingrix discussed. Please contact your pharmacy for coverage information.     Advanced directives: Advance directive discussed with you today. I have provided a copy for you to complete at home and have notarized. Once this is complete please bring a copy in to our office so we can scan it into your chart.  Conditions/risks identified: Continue healthy habits to meet your goals.   Next appointment: Please follow up in one year for your Medicare Annual Wellness visit.     Preventive Care 44 Years and Older, Female Preventive care refers to lifestyle choices and visits with your health care provider that can promote health and wellness. What does preventive care include?  A yearly physical exam. This is also called an annual well check.  Dental exams once or twice a year.  Routine eye exams. Ask your health care provider how often you should have your eyes checked.  Personal lifestyle choices, including:  Daily care of your teeth and gums.  Regular physical activity.  Eating a healthy diet.  Avoiding tobacco and drug use.  Limiting alcohol use.  Practicing safe sex.  Taking low-dose aspirin every day.  Taking vitamin and mineral supplements as recommended by your health care provider. What  happens during an annual well check? The services and screenings done by your health care provider during your annual well check will depend on your age, overall health, lifestyle risk factors, and family history of disease. Counseling  Your health care provider may ask you questions about your:  Alcohol use.  Tobacco use.  Drug use.  Emotional well-being.  Home and relationship well-being.  Sexual activity.  Eating habits.  History of falls.  Memory and ability to understand (cognition).  Work and work Statistician.  Reproductive health. Screening  You may have the following tests or measurements:  Height, weight, and BMI.  Blood pressure.  Lipid and cholesterol levels. These may be checked every 5 years, or more frequently if you are over 23 years old.  Skin check.  Lung cancer screening. You may have this screening every year starting at age 14 if you have a 30-pack-year history of smoking and currently smoke or have quit within the past 15 years.  Fecal occult blood test (FOBT) of the stool. You may have this test every year starting at age 62.  Flexible sigmoidoscopy or colonoscopy. You may have a sigmoidoscopy every 5 years or a colonoscopy every 10 years starting at age 34.  Hepatitis C blood test.  Hepatitis B blood test.  Sexually transmitted disease (STD) testing.  Diabetes screening. This is done by checking your blood sugar (glucose) after you have not eaten for a while (fasting). You may have this done every 1-3 years.  Bone density scan. This is done to screen for osteoporosis. You may have this done starting at age 25.  Mammogram. This may be done every 1-2 years. Talk to your health  care provider about how often you should have regular mammograms. Talk with your health care provider about your test results, treatment options, and if necessary, the need for more tests. Vaccines  Your health care provider may recommend certain vaccines, such  as:  Influenza vaccine. This is recommended every year.  Tetanus, diphtheria, and acellular pertussis (Tdap, Td) vaccine. You may need a Td booster every 10 years.  Zoster vaccine. You may need this after age 57.  Pneumococcal 13-valent conjugate (PCV13) vaccine. One dose is recommended after age 44.  Pneumococcal polysaccharide (PPSV23) vaccine. One dose is recommended after age 14. Talk to your health care provider about which screenings and vaccines you need and how often you need them. This information is not intended to replace advice given to you by your health care provider. Make sure you discuss any questions you have with your health care provider. Document Released: 01/10/2016 Document Revised: 09/02/2016 Document Reviewed: 10/15/2015 Elsevier Interactive Patient Education  2017 [place] Prevention in the Home Falls can cause injuries. They can happen to people of all ages. There are many things you can do to make your home safe and to help prevent falls. What can I do on the outside of my home?  Regularly fix the edges of walkways and driveways and fix any cracks.  Remove anything that might make you trip as you walk through a door, such as a raised step or threshold.  Trim any bushes or trees on the path to your home.  Use bright outdoor lighting.  Clear any walking paths of anything that might make someone trip, such as rocks or tools.  Regularly check to see if handrails are loose or broken. Make sure that both sides of any steps have handrails.  Any raised decks and porches should have guardrails on the edges.  Have any leaves, snow, or ice cleared regularly.  Use sand or salt on walking paths during winter.  Clean up any spills in your garage right away. This includes oil or grease spills. What can I do in the bathroom?  Use night lights.  Install grab bars by the toilet and in the tub and shower. Do not use towel bars as grab bars.  Use  non-skid mats or decals in the tub or shower.  If you need to sit down in the shower, use a plastic, non-slip stool.  Keep the floor dry. Clean up any water that spills on the floor as soon as it happens.  Remove soap buildup in the tub or shower regularly.  Attach bath mats securely with double-sided non-slip rug tape.  Do not have throw rugs and other things on the floor that can make you trip. What can I do in the bedroom?  Use night lights.  Make sure that you have a light by your bed that is easy to reach.  Do not use any sheets or blankets that are too big for your bed. They should not hang down onto the floor.  Have a firm chair that has side arms. You can use this for support while you get dressed.  Do not have throw rugs and other things on the floor that can make you trip. What can I do in the kitchen?  Clean up any spills right away.  Avoid walking on wet floors.  Keep items that you use a lot in easy-to-reach places.  If you need to reach something above you, use a strong step stool that has a grab  bar.  Keep electrical cords out of the way.  Do not use floor polish or wax that makes floors slippery. If you must use wax, use non-skid floor wax.  Do not have throw rugs and other things on the floor that can make you trip. What can I do with my stairs?  Do not leave any items on the stairs.  Make sure that there are handrails on both sides of the stairs and use them. Fix handrails that are broken or loose. Make sure that handrails are as long as the stairways.  Check any carpeting to make sure that it is firmly attached to the stairs. Fix any carpet that is loose or worn.  Avoid having throw rugs at the top or bottom of the stairs. If you do have throw rugs, attach them to the floor with carpet tape.  Make sure that you have a light switch at the top of the stairs and the bottom of the stairs. If you do not have them, ask someone to add them for you. What  else can I do to help prevent falls?  Wear shoes that:  Do not have high heels.  Have rubber bottoms.  Are comfortable and fit you well.  Are closed at the toe. Do not wear sandals.  If you use a stepladder:  Make sure that it is fully opened. Do not climb a closed stepladder.  Make sure that both sides of the stepladder are locked into place.  Ask someone to hold it for you, if possible.  Clearly mark and make sure that you can see:  Any grab bars or handrails.  First and last steps.  Where the edge of each step is.  Use tools that help you move around (mobility aids) if they are needed. These include:  Canes.  Walkers.  Scooters.  Crutches.  Turn on the lights when you go into a dark area. Replace any light bulbs as soon as they burn out.  Set up your furniture so you have a clear path. Avoid moving your furniture around.  If any of your floors are uneven, fix them.  If there are any pets around you, be aware of where they are.  Review your medicines with your doctor. Some medicines can make you feel dizzy. This can increase your chance of falling. Ask your doctor what other things that you can do to help prevent falls. This information is not intended to replace advice given to you by your health care provider. Make sure you discuss any questions you have with your health care provider. Document Released: 10/10/2009 Document Revised: 05/21/2016 Document Reviewed: 01/18/2015 Elsevier Interactive Patient Education  2017 [employer].

## 2019-01-24 NOTE — Progress Notes (Signed)
Name: Christina Salas   MRN: [id number]    DOB: 11/30/1933   Date:01/24/2019       Progress Note  Subjective  Chief Complaint  Chief Complaint  Patient presents with  . Depression  . Gastroesophageal Reflux  . Hyperlipidemia  . Medication Refill    HPI  Iron deficiency anemia: found in 2018,  she did not want to see GI and was given hemoccult cards but she never collected specimen, but developed change in bowel movements in 2019 and agreed on seeing GI, she had EGD and colonoscopy Dec 2019 done by Dr. Durwin Reges and still no source for the anemia, not currently taking iron supplementation, she states waiting to see if she will get virtual colonoscopy done. We will recheck labs. She denies pica , she has fatigue but no SOB   Angina at rest: she states symptoms were worseSpring of 2017went to Dr. Clayborn Bigness and had a normal stress test. She states her heart gurgles intermittently, she has SOB with mild/moderateactivity. She is doing better now, off  aspirin because she takes Excedrin daily, advised to stop Excedrin and take Tylenol for pain, and resume baby aspirin   Pre-diabetes: she denies polyphagia, polydipsia, she states no change in urinary frequency.last hgbA1C up to 6.3%. We will recheck last visit   Edema extremities: she is trying to walk more, wearing compression stocking hoses, but sometimes does not seem to help with swelling. Denies claudication, still sees Dr. Lucky Cowboy, but now only on prn basis .   GERD: she is taking Nexium 20 mg most days but sometimes she needs to take two daily, advised to take Tums or Pepcid prn if needed.  She denies heartburn, but has indigestion   Headaches: dull aching pain usually frontal, occasionally temporal and sharp pain, intermittently and only takes medications prn either fioricet or excedrin, advised to avoid taking pain medication daily because it can cause rebound headache . No photophobia or phonophobia  History of breast cancer: she  still gets mammograms, no lumps or nipple discharge at this time. Mammogram is up to date 04/2018   Depression: she has noticed lack of motivation, prefers not getting out of the house, sometimes does not want to take showers or work on her projects. phq 9 was up to  7, we gave her Citalopram but she never started taking because she was afraid of side effects, discussed that it will help her symptoms. She denies suicidal thoughts or ideation   Urinary incontinence and recurrent UTI's: she used to see Dr. Jacqlyn Larsen, he used to give her macrobid 2 with refills to take prn, advised to follow up with another Urologist , she is willing to go see Dr. Bernardo Heater now   Left knee pain: she noticed that over the past month her left knee pops, is painful , sometimes she has to limp and has difficulty walking   Patient Active Problem List   Diagnosis Date Noted  . Mild depression ([place]) 01/24/2019  . Large hiatal hernia 01/24/2019  . Internal hemorrhoids 01/24/2019  . Iron deficiency anemia due to chronic blood loss   . Venous reflux 04/19/2018  . Swelling of limb 04/27/2017  . Prediabetes 06/08/2016  . Obesity (BMI 30.0-34.9) 06/08/2016  . Vitamin D deficiency 06/08/2016  . Post-menopausal 12/11/2015  . DD (diverticular disease) 06/19/2015  . Edema extremities 06/19/2015  . Esophageal reflux 06/19/2015  . HLD (hyperlipidemia) 06/19/2015  . Varicose veins of leg with swelling, bilateral 06/19/2015  . Recurrent UTI 02/08/2013  .  Mixed incontinence 02/08/2013  . History of breast cancer, left, T1, N0, Left MRM - Dec 1996 03/18/2012  . Tension headache 05/24/2007  . History of melanoma 08/28/1994    Past Surgical History:  Procedure Laterality Date  . ABDOMINAL HYSTERECTOMY  2001  . BREAST BIOPSY  2010  . BREAST EXCISIONAL BIOPSY Right 1997   Benign excisional biopsy   . COLONOSCOPY  04/02/2014  . COLONOSCOPY WITH PROPOFOL N/A 12/27/2018   Procedure: COLONOSCOPY WITH PROPOFOL;  Surgeon: Lucilla Lame, MD;  Location[facility] ENDOSCOPY;  Service: Endoscopy;  Laterality: N/A;  . CYSTOSCOPY  11/2014  . ESOPHAGOGASTRODUODENOSCOPY (EGD) WITH PROPOFOL N/A 12/27/2018   Procedure: ESOPHAGOGASTRODUODENOSCOPY (EGD) WITH PROPOFOL;  Surgeon: Lucilla Lame, MD;  Location: [facility] ENDOSCOPY;  Service: Endoscopy;  Laterality: N/A;  . MASTECTOMY  1996   Left  . MELANOMA EXCISION  1995   Head    Family History  Problem Relation Age of Onset  . Breast cancer Mother   . Migraines Mother   . Diabetes Father   . CAD Father   . Thrombosis Father   . Biliary Cirrhosis Brother   . Breast cancer Other        Niece    Social History   Socioeconomic History  . Marital status: Single    Spouse name: Not on file  . Number of children: 0  . Years of education: Not on file  . Highest education level: Master's degree (e.g., MA, MS, MEng, MEd, MSW, MBA)  Occupational History  . Occupation: Retired  Scientific laboratory technician  . Financial resource strain: Not hard at all  . Food insecurity:    Worry: Never true    Inability: Never true  . Transportation needs:    Medical: No    Non-medical: No  Tobacco Use  . Smoking status: Never Smoker  . Smokeless tobacco: Never Used  Substance and Sexual Activity  . Alcohol use: No    Alcohol/week: 0.0 standard drinks  . Drug use: Never  . Sexual activity: Not Currently  Lifestyle  . Physical activity:    Days per week: 0 days    Minutes per session: 0 min  . Stress: Only a little  Relationships  . Social connections:    Talks on phone: Not on file    Gets together: Not on file    Attends religious service: Not on file    Active member of club or organization: Not on file    Attends meetings of clubs or organizations: Not on file    Relationship status: Never married  . Intimate partner violence:    Fear of current or ex partner: No    Emotionally abused: No    Physically abused: No    Forced sexual activity: No  Other Topics Concern  . 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  butalbital-acetaminophen-caffeine (FIORICET, ESGIC) 50-325-40 MG tablet, Take 1 tablet by mouth every 6 (six) hours as needed (as needed)., Disp: 20 tablet, Rfl: 0 .  Calcium-Magnesium 500-250 MG TABS, Take 2 tablets by mouth 2 (two) times daily., Disp: , Rfl:  .  Cholecalciferol (VITAMIN D3) 1000 units CAPS, Take 1 capsule by mouth daily. , Disp: , Rfl:  .  Cinnamon 500 MG capsule, Take 1 capsule by mouth daily.,  Disp: , Rfl:  .  Cranberry ([employer] CRANBERRY) 500 MG CAPS, Take 1 tablet by mouth daily., Disp: , Rfl:  .  Elastic Bandages & Supports (ULTRA SUPPORT SPINAL ORTHOSIS) MISC, Wear upper back (thoracic) support brace daily., Disp: , Rfl:  .  esomeprazole (NEXIUM) 20 MG capsule, TAKE 1 TO 2 CAPSULES(20 TO 40 MG) BY MOUTH EVERY MORNING (Patient taking differently: Take 20 mg by mouth daily at 12 noon. ), Disp: 180 capsule, Rfl: 0 .  Multiple Vitamins-Calcium (ESSENTIAL ONE DAILY MULTIVIT) TABS, Take by mouth., Disp: , Rfl:  .  Omega-3 1000 MG CAPS, Take 1 capsule by mouth 2 (two) times daily., Disp: , Rfl:  .  citalopram (CELEXA) 10 MG tablet, Take 1 tablet (10 mg total) by mouth daily. (Patient not taking: Reported on 01/24/2019), Disp: 30 tablet, Rfl: 1 .  Coenzyme Q10 (COQ10) 30 MG CAPS, Take 1 capsule by mouth every other day., Disp: , Rfl:  .  nitrofurantoin (MACRODANTIN) 100 MG capsule, Take 100 mg by mouth as needed.  , Disp: , Rfl:   No Known Allergies  I personally reviewed active problem list, medication list, allergies, family history, social history with the patient/caregiver today.   ROS  Constitutional: Negative for fever or weight change.  Respiratory: Negative for cough and shortness of breath.   Cardiovascular: positive for intermittent chest pain but no  palpitations.  Gastrointestinal:  Negative for abdominal pain, no bowel changes.  Musculoskeletal: Positive  for gait problem - walks slowly, but no  joint swelling.  Skin: Negative for rash.  Neurological: Negative for dizziness, positive for intermittent  headache.  No other specific complaints in a complete review of systems (except as listed in HPI above).  Objective  Vitals:   01/24/19 0827  BP: 122/72  Pulse: 70  Resp: 16  Temp: 97.7 F (36.5 C)  TempSrc: Oral  SpO2: 97%  Weight: 176 lb 4.8 oz (80 kg)  Height: 5\' 2"  (1.575 m)    Body mass index is 32.25 kg/m.  Physical Exam  Constitutional: Patient appears well-developed and well-nourished. Obese  No distress.  HEENT: head atraumatic, normocephalic, pupils equal and reactive to light, neck supple, throat within normal limits Cardiovascular: Normal rate, regular rhythm and normal heart sounds.  No murmur heard. 1 plus  BLE edema wearing compression stocking hoses Pulmonary/Chest: Effort normal and breath sounds normal. No respiratory distress. Abdominal: Soft.  There is no tenderness. Muscular Skeletal: crepitus with extension of both knees, and effusion of left knee, no redness or increase in warmth Psychiatric: Patient has a normal mood and affect. behavior is normal. Judgment and thought content normal.   PHQ2/9: Depression screen [facility] 2/9 01/24/2019 10/19/2018 10/25/2017 04/14/2017 09/28/2016  Decreased Interest 1 1 0 0 0  Down, Depressed, Hopeless 0 1 0 0 0  PHQ - 2 Score 1 2 0 0 0  Altered sleeping 1 1 - - -  Tired, decreased energy 1 2 - - -  Change in appetite 1 0 - - -  Feeling bad or failure about yourself  1 1 - - -  Trouble concentrating 0 0 - - -  Moving slowly or fidgety/restless 1 1 - - -  Suicidal thoughts 0 0 - - -  PHQ-9 Score 6 7 - - -  Difficult doing work/chores Somewhat difficult Somewhat difficult - - -     Fall Risk: Fall Risk  01/24/2019 01/24/2019 10/19/2018 10/25/2017 04/14/2017  Falls in the past year? 0 0 No No Yes  Number falls in past yr: - 0 - - 1  Injury with Fall? - - - - No     Assessment & Plan  1. Mild depression ([place])  She never started medication, was waiting for GI evaluation, advised to start it   2. Angina at rest [facility])  She denies recent episodes   3. Large hiatal hernia  Seeing Dr. Durwin Reges  4. Internal hemorrhoids   5. Other iron deficiency anemia  - CBC with Differential/Platelet - Iron, TIBC and Ferritin Panel  6. Hypertriglyceridemia  - Lipid panel  7. Tension headache  - butalbital-acetaminophen-caffeine (FIORICET, ESGIC) 50-325-40 MG tablet; Take 1 tablet by mouth every 6 (six) hours as needed (as needed).  Dispense: 20 tablet; Refill: 0  8. Gastroesophageal reflux disease without esophagitis  - esomeprazole (NEXIUM) 20 MG capsule; Take 1 capsule (20 mg total) by mouth daily at 12 noon.  Dispense: 90 capsule; Refill: 1  9. Recurrent UTI  - Ambulatory referral to Urology  10. Recurrent left knee instability  - Ambulatory referral to Orthopedic Surgery  11. Effusion of left knee  - Ambulatory referral to Orthopedic Surgery

## 2019-01-25 LAB — CBC WITH DIFFERENTIAL/PLATELET
ABSOLUTE MONOCYTES: 549 {cells}/uL (ref 200–950)
BASOS ABS: 16 {cells}/uL (ref 0–200)
Basophils Relative: 0.2 %
Eosinophils Absolute: 107 cells/uL (ref 15–500)
Eosinophils Relative: 1.3 %
HCT: 37.6 % (ref 35.0–45.0)
Hemoglobin: 11.6 g/dL — ABNORMAL LOW (ref 11.7–15.5)
Lymphs Abs: 3239 cells/uL (ref 850–3900)
MCH: 23.3 pg — ABNORMAL LOW (ref 27.0–33.0)
MCHC: 30.9 g/dL — ABNORMAL LOW (ref 32.0–36.0)
MCV: 75.7 fL — ABNORMAL LOW (ref 80.0–100.0)
MPV: 9.7 fL (ref 7.5–12.5)
Monocytes Relative: 6.7 %
Neutro Abs: 4289 cells/uL (ref 1500–7800)
Neutrophils Relative %: 52.3 %
Platelets: 234 10*3/uL (ref 140–400)
RBC: 4.97 10*6/uL (ref 3.80–5.10)
RDW: 17 % — AB (ref 11.0–15.0)
Total Lymphocyte: 39.5 %
WBC: 8.2 10*3/uL (ref 3.8–10.8)

## 2019-01-25 LAB — LIPID PANEL
Cholesterol: 178 mg/dL (ref <200)
HDL: 50 mg/dL — AB (ref >50)
LDL Cholesterol (Calc): 100 mg/dL (calc) — ABNORMAL HIGH
Non-HDL Cholesterol (Calc): 128 mg/dL (calc) (ref <130)
Total CHOL/HDL Ratio: 3.6 (calc) (ref <5.0)
Triglycerides: 183 mg/dL — ABNORMAL HIGH (ref <150)

## 2019-01-25 LAB — IRON,TIBC AND FERRITIN PANEL
%SAT: 6 % (calc) — ABNORMAL LOW (ref 16–45)
Ferritin: 8 ng/mL — ABNORMAL LOW (ref 16–288)
Iron: 30 ug/dL — ABNORMAL LOW (ref 45–160)
TIBC: 519 mcg/dL (calc) — ABNORMAL HIGH (ref 250–450)

## 2019-02-16 ENCOUNTER — Other Ambulatory Visit: Payer: Self-pay | Admitting: Nurse Practitioner

## 2019-02-16 DIAGNOSIS — K219 Gastro-esophageal reflux disease without esophagitis: Secondary | ICD-10-CM

## 2019-02-22 ENCOUNTER — Ambulatory Visit: Payer: Medicare Other | Admitting: Urology

## 2019-02-22 ENCOUNTER — Encounter: Payer: Self-pay | Admitting: Urology

## 2019-02-22 VITALS — BP 148/74 | HR 106 | Ht 62.0 in | Wt 174.5 lb

## 2019-02-22 DIAGNOSIS — N3941 Urge incontinence: Secondary | ICD-10-CM

## 2019-02-22 DIAGNOSIS — N390 Urinary tract infection, site not specified: Secondary | ICD-10-CM | POA: Diagnosis not present

## 2019-02-22 LAB — URINALYSIS, COMPLETE
Bilirubin, UA: NEGATIVE
GLUCOSE, UA: NEGATIVE
Leukocytes, UA: NEGATIVE
NITRITE UA: NEGATIVE
RBC, UA: NEGATIVE
Specific Gravity, UA: >1.03 — ABNORMAL HIGH (ref 1.005–1.030)
Urobilinogen, Ur: 0.2 mg/dL (ref 0.2–1.0)
pH, UA: 5 (ref 5.0–7.5)

## 2019-02-22 LAB — MICROSCOPIC EXAMINATION: RBC MICROSCOPIC, UA: NONE SEEN /HPF (ref 0–2)

## 2019-02-22 NOTE — Progress Notes (Signed)
02/22/2019 10:14 AM   Christina Salas 1933/08/04 [id number]  Referring provider: Steele Sizer, MD [address] [place], [place] [address]  Chief Complaint  Patient presents with  . Recurrent UTI    HPI: 85 yo female seen for recurrent UTI. She typically gets bladder pain and dysuria, but recently she has been more asymptomatic. She followed with Dr. Jacqlyn Salas and last saw him in 2018 and was doing well on nitrofurantoin as needed.  She takes NF sparingly. She has a history of mixed incontinence with frequency and urgency.  She voids with a good flow. Occasional urgency and UUI.   Postvoid residual was 0. She has had no gross hematuria or flank pain. UA today with no hematuria. She typically drinks milk and a diet orange drink.  She had a hysterectomy. No NG risk.   Modifying factors: There are no other modifying factors  Associated signs and symptoms: There are no other associated signs and symptoms Aggravating and relieving factors: There are no other aggravating or relieving factors Severity: Moderate Duration: Persistent   PMH: Past Medical History:  Diagnosis Date  . Abnormal glucose   . Anxiety   . Arthritis   . Breast cancer ([place])   . Cancer ([place])    melanoma  . Chronic headaches   . Depression   . GERD (gastroesophageal reflux disease)   . H/O melanoma excision   . History of diverticulitis   . History of recurrent UTIs   . Osteoporosis    pre-osetoporosis per patient history form 03/18/12  . Pre-diabetes   . Vitamin D deficiency     Surgical History: Past Surgical History:  Procedure Laterality Date  . ABDOMINAL HYSTERECTOMY  2001  . BREAST BIOPSY  2010  . BREAST EXCISIONAL BIOPSY Right 1997   Benign excisional biopsy   . COLONOSCOPY  04/02/2014  . COLONOSCOPY WITH PROPOFOL N/A 12/27/2018   Procedure: COLONOSCOPY WITH PROPOFOL;  Surgeon: Christina Lame, MD;  Location: [facility] ENDOSCOPY;  Service: Endoscopy;  Laterality: N/A;  . CYSTOSCOPY   11/2014  . ESOPHAGOGASTRODUODENOSCOPY (EGD) WITH PROPOFOL N/A 12/27/2018   Procedure: ESOPHAGOGASTRODUODENOSCOPY (EGD) WITH PROPOFOL;  Surgeon: Christina Lame, MD;  Location: [facility] ENDOSCOPY;  Service: Endoscopy;  Laterality: N/A;  . MASTECTOMY  1996   Left  . MELANOMA EXCISION  1995   Head    Home Medications:  Allergies as of 02/22/2019   No Known Allergies     Medication List       Accurate as of February 22, 2019 10:14 AM. Always use your most recent med list.        butalbital-acetaminophen-caffeine 50-325-40 MG tablet Commonly known as:  FIORICET, ESGIC Take 1 tablet by mouth every 6 (six) hours as needed (as needed).   Calcium-Magnesium 500-250 MG Tabs Take 2 tablets by mouth 2 (two) times daily.   Cinnamon 500 MG capsule Take 1 capsule by mouth daily.   CoQ10 30 MG Caps Take 1 capsule by mouth every other day.   [employer] CRANBERRY 500 MG Caps Generic drug:  Cranberry Take 1 tablet by mouth daily.   esomeprazole 20 MG capsule Commonly known as:  NEXIUM TAKE 1 CAPSULE(20 MG) BY MOUTH DAILY   ESSENTIAL ONE DAILY MULTIVIT Tabs Take by mouth.   nitrofurantoin 100 MG capsule Commonly known as:  MACRODANTIN Take 100 mg by mouth as needed.   Omega-3 1000 MG Caps Take 1 capsule by mouth 2 (two) times daily.   ULTRA SUPPORT SPINAL ORTHOSIS Misc Wear upper back (  thoracic) support brace daily.       Allergies: No Known Allergies  Family History: Family History  Problem Relation Age of Onset  . Breast cancer Mother   . Migraines Mother   . Diabetes Father   . CAD Father   . Thrombosis Father   . Biliary Cirrhosis Brother   . Breast cancer Other        Niece    Social History:  reports that she has never smoked. She has never used smokeless tobacco. She reports that she does not drink alcohol or use drugs.  ROS:                                        Physical Exam: There were no vitals taken for this visit.  Constitutional:   Alert and oriented, No acute distress. HEENT: [place] AT, moist mucus membranes.  Trachea midline, no masses. Cardiovascular: No clubbing, cyanosis, or edema. Respiratory: Normal respiratory effort, no increased work of breathing. GI: Abdomen is soft, nontender, nondistended, no abdominal masses GU: No CVA tenderness Skin: No rashes, bruises or suspicious lesions. Neurologic: Grossly intact, no focal deficits, moving all 4 extremities. Psychiatric: Normal mood and affect.  Laboratory Data: Lab Results  Component Value Date   WBC 8.2 01/24/2019   HGB 11.6 (L) 01/24/2019   HCT 37.6 01/24/2019   MCV 75.7 (L) 01/24/2019   PLT 234 01/24/2019    Lab Results  Component Value Date   CREATININE 0.82 10/19/2018    No results found for: PSA  No results found for: TESTOSTERONE  Lab Results  Component Value Date   HGBA1C 6.3 (H) 10/19/2018    Urinalysis    Component Value Date/Time   COLORURINE Yellow 02/23/2013 0938   APPEARANCEUR Clear 02/23/2013 0938   LABSPEC 1.012 02/23/2013 0938   PHURINE 6.0 02/23/2013 0938   GLUCOSEU Negative 02/23/2013 0938   HGBUR 1+ 02/23/2013 0938   BILIRUBINUR Negative 02/23/2013 0938   KETONESUR Negative 02/23/2013 0938   PROTEINUR Negative 02/23/2013 0938   NITRITE Negative 02/23/2013 0938   LEUKOCYTESUR Negative 02/23/2013 0938    Lab Results  Component Value Date   BACTERIA NONE SEEN 02/23/2013    Pertinent Imaging: none No results found for this or any previous visit. No results found for this or any previous visit. No results found for this or any previous visit. No results found for this or any previous visit. No results found for this or any previous visit. No results found for this or any previous visit. No results found for this or any previous visit. No results found for this or any previous visit.  Assessment & Plan:    1. Recurrent UTI - Doing well on occasional NF.  - Urinalysis, Complete  2. Urgency, UUI - monitor -  discussed PT, meds and other thrapies. She will consider.   No follow-ups on file.  Christina Aloe, MD  [facility] [address] [place], [place] [address] [phone]

## 2019-02-22 NOTE — Patient Instructions (Addendum)

## 2019-04-25 ENCOUNTER — Encounter: Payer: Self-pay | Admitting: Family Medicine

## 2019-04-25 ENCOUNTER — Ambulatory Visit (INDEPENDENT_AMBULATORY_CARE_PROVIDER_SITE_OTHER): Payer: Medicare Other | Admitting: Family Medicine

## 2019-04-25 ENCOUNTER — Ambulatory Visit: Payer: Medicare Other | Admitting: Family Medicine

## 2019-04-25 ENCOUNTER — Other Ambulatory Visit: Payer: Self-pay

## 2019-04-25 DIAGNOSIS — G44209 Tension-type headache, unspecified, not intractable: Secondary | ICD-10-CM

## 2019-04-25 DIAGNOSIS — R7303 Prediabetes: Secondary | ICD-10-CM

## 2019-04-25 DIAGNOSIS — N390 Urinary tract infection, site not specified: Secondary | ICD-10-CM

## 2019-04-25 DIAGNOSIS — E781 Pure hyperglyceridemia: Secondary | ICD-10-CM

## 2019-04-25 DIAGNOSIS — K219 Gastro-esophageal reflux disease without esophagitis: Secondary | ICD-10-CM

## 2019-04-25 DIAGNOSIS — F320 Major depressive disorder, single episode, mild: Secondary | ICD-10-CM

## 2019-04-25 DIAGNOSIS — R600 Localized edema: Secondary | ICD-10-CM

## 2019-04-25 DIAGNOSIS — D508 Other iron deficiency anemias: Secondary | ICD-10-CM | POA: Diagnosis not present

## 2019-04-25 DIAGNOSIS — R739 Hyperglycemia, unspecified: Secondary | ICD-10-CM

## 2019-04-25 DIAGNOSIS — F32A Depression, unspecified: Secondary | ICD-10-CM

## 2019-04-25 MED ORDER — ESOMEPRAZOLE MAGNESIUM 20 MG PO CPDR: 20.0000 mg | DELAYED_RELEASE_CAPSULE | Freq: Every day | ORAL | 1 refills | Status: DC

## 2019-04-25 NOTE — Progress Notes (Signed)
Name: Christina Salas   MRN: [id number]    DOB: July 21, 1933   Date:04/25/2019       Progress Note  Subjective  Chief Complaint  Chief Complaint  Patient presents with  . Medication Refill  . Depression  . Gastroesophageal Reflux    Well controlled  . Iron Deficiency Anemia  . Edema extremities    wearing compression stocking because she has not been as active as usual  . Headache    Has been "not too bad"  . Recurrent UTI    Has a new Urologist and ran out of antibiotics   . Sore Throat    The past couple of days and ahs been taking Delsym and post nasal drip     I connected with  [name] on 04/25/19 at  2:20 PM EDT by telephone and verified that I am speaking with the correct person using two identifiers.  I discussed the limitations, risks, security and privacy concerns of performing an evaluation and management service by telephone and the availability of in person appointments. Staff also discussed with the patient that there may be a patient responsible charge related to this service. Patient Location: home Provider Location: [facility]   HPI  Recurrent sore throat: she states happens intermittent, in the early morning, but resolves by itself. She states she tried to take antibiotics for UTI for that reason and it resolved. Explained that [place] does not work for that. Advised to gargle with warm salted water, use humidifier in the room and may take tylenol prn. She can also try saline spray at night   Iron deficiency anemia: found in 2018,  she did not want to see GI and was given hemoccult cards but she never collected specimen, but developed change in bowel movements in 2019 and agreed on seeing GI, she had EGD and colonoscopy Dec 2019 done by Dr. Durwin Reges and still no source for the anemia, not currently taking iron supplementation, she states waiting to see if she will get virtual colonoscopy done. Reviewed last labs with patient . She denies  pica , she has fatigue but no SOB   Angina at rest: she states symptoms were worseSpring of 2017went to Dr. Clayborn Bigness and had a normal stress test. She states her heart gurgles intermittently, she has SOB with mild/moderateactivity. Symptoms are unchanged. Taking aspirin No palpitations at this time  Pre-diabetes: she denies polyphagia, polydipsia, she states no change in urinary frequency.last hgbA1C up to 6.3%. We will recheck last visit   Edema extremities: she has not been walking as much because of COVID-19 but is wearing compression stoking hoses and is doing well at this time. Advised to walk around her house to keep her strength going   GERD:she is taking Nexium 20 mg most days but sometimes she needs to take two daily, advised to take Tums or Pepcid prn if needed.  She denies heartburn, but has indigestion. She needs refill of medication  Headaches: dull aching pain usually frontal, occasionally temporal and sharp pain, intermittently and only takes medications prneither fioricet or excedrin, advised to avoid taking pain medication daily because it can cause rebound headache. No photophobia or phonophobia. Unchanged, symptoms have been mild   History of breast cancer: she still gets mammograms, no lumps or nipple discharge at this time. Mammogram is due, but cannot go secondary to COVID-19   Depression: she has noticed lack of motivation, prefers not getting out of the house, sometimes does not want  to take showers or work on her projects. phq 9 was up to  5, we gave her Citalopram but she never started taking because she was afraid of side effects, she states she has been feeling more isolated, she states when she is out of the house she feels better, and will try to do that more often   Urinary incontinence and recurrent UTI's: she used to see Dr. Jacqlyn Larsen, he used to give her macrobid 37 with refills to take prn, advised to follow up with another Urologist , she is under the  care of Dr. Junious Silk now    Patient Active Problem List   Diagnosis Date Noted  . Mild depression ([place]) 01/24/2019  . Large hiatal hernia 01/24/2019  . Internal hemorrhoids 01/24/2019  . Iron deficiency anemia due to chronic blood loss   . Venous reflux 04/19/2018  . Swelling of limb 04/27/2017  . Prediabetes 06/08/2016  . Obesity (BMI 30.0-34.9) 06/08/2016  . Vitamin D deficiency 06/08/2016  . Post-menopausal 12/11/2015  . DD (diverticular disease) 06/19/2015  . Edema extremities 06/19/2015  . Esophageal reflux 06/19/2015  . HLD (hyperlipidemia) 06/19/2015  . Varicose veins of leg with swelling, bilateral 06/19/2015  . Recurrent UTI 02/08/2013  . Mixed incontinence 02/08/2013  . History of breast cancer, left, T1, N0, Left MRM - Dec 1996 03/18/2012  . Tension headache 05/24/2007  . History of melanoma 08/28/1994    Past Surgical History:  Procedure Laterality Date  . ABDOMINAL HYSTERECTOMY  2001  . BREAST BIOPSY  2010  . BREAST EXCISIONAL BIOPSY Right 1997   Benign excisional biopsy   . COLONOSCOPY  04/02/2014  . COLONOSCOPY WITH PROPOFOL N/A 12/27/2018   Procedure: COLONOSCOPY WITH PROPOFOL;  Surgeon: Lucilla Lame, MD;  Location: [facility] ENDOSCOPY;  Service: Endoscopy;  Laterality: N/A;  . CYSTOSCOPY  11/2014  . ESOPHAGOGASTRODUODENOSCOPY (EGD) WITH PROPOFOL N/A 12/27/2018   Procedure: ESOPHAGOGASTRODUODENOSCOPY (EGD) WITH PROPOFOL;  Surgeon: Lucilla Lame, MD;  Location: [facility] ENDOSCOPY;  Service: Endoscopy;  Laterality: N/A;  . MASTECTOMY  1996   Left  . MELANOMA EXCISION  1995   Head    Family History  Problem Relation Age of Onset  . Breast cancer Mother   . Migraines Mother   . Diabetes Father   . CAD Father   . Thrombosis Father   . Biliary Cirrhosis Brother   . Breast cancer Other        Niece    Social History   Socioeconomic History  . Marital status: Single    Spouse name: Not on file  . Number of children: 0  . Years of education: Not on file   . Highest education level: Master's degree (e.g., MA, MS, MEng, MEd, MSW, MBA)  Occupational History  . Occupation: Retired  Scientific laboratory technician  . Financial resource strain: Not hard at all  . Food insecurity:    Worry: Never true    Inability: Never true  . Transportation needs:    Medical: No    Non-medical: No  Tobacco Use  . Smoking status: Never Smoker  . Smokeless tobacco: Never Used  Substance and Sexual Activity  . Alcohol use: No    Alcohol/week: 0.0 standard drinks  . Drug use: Never  . Sexual activity: Not Currently  Lifestyle  . Physical activity:    Days per week: 0 days    Minutes per session: 0 min  . Stress: Only a little  Relationships  . Social connections:    Talks on phone: More  than three times a week    Gets together: Three times a week    Attends religious service: More than 4 times per year    Active member of club or organization: No    Attends meetings of clubs or organizations: Never    Relationship status: Never married  . Intimate partner violence:    Fear of current or ex partner: No    Emotionally abused: No    Physically abused: No    Forced sexual activity: No  Other Topics Concern  . 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  butalbital-acetaminophen-caffeine (FIORICET, ESGIC) 50-325-40 MG tablet, Take 1 tablet by mouth every 6 (six) hours as needed (as needed)., Disp: 20 tablet, Rfl: 0 .  Calcium-Magnesium 500-250 MG TABS, Take 2 tablets by mouth 2 (two) times daily., Disp: , Rfl:  .  Cinnamon 500 MG capsule, Take 1 capsule by mouth daily., Disp: , Rfl:  .  Cranberry ([employer]S CRANBERRY) 500 MG CAPS, Take 1 tablet by mouth daily., Disp: , Rfl:  .  Elastic Bandages & Supports (ULTRA SUPPORT SPINAL ORTHOSIS) MISC, Wear upper back (thoracic) support brace daily., Disp: , Rfl:  .  esomeprazole  (NEXIUM) 20 MG capsule, TAKE 1 CAPSULE(20 MG) BY MOUTH DAILY, Disp: 90 capsule, Rfl: 0 .  Multiple Vitamins-Calcium (ESSENTIAL ONE DAILY MULTIVIT) TABS, Take by mouth., Disp: , Rfl:  .  nitrofurantoin (MACRODANTIN) 100 MG capsule, Take 100 mg by mouth as needed.  , Disp: , Rfl:  .  Omega-3 1000 MG CAPS, Take 1 capsule by mouth 2 (two) times a day. , Disp: , Rfl:  .  Turmeric 400 MG CAPS, Take 1 capsule by mouth daily., Disp: , Rfl:  .  Coenzyme Q10 (COQ10) 30 MG CAPS, Take 1 capsule by mouth every other day., Disp: , Rfl:   No Known Allergies  I personally reviewed active problem list, medication list, allergies, family history with the patient/caregiver today.   ROS  Ten systems reviewed and is negative except as mentioned in HPI   Objective  Virtual encounter, vitals not obtained.  There is no height or weight on file to calculate BMI.  Physical Exam  Awake, alert and oriented.    PHQ2/9: Depression screen [facility] 2/9 04/25/2019 01/24/2019 10/19/2018 10/25/2017 04/14/2017  Decreased Interest 1 1 1  0 0  Down, Depressed, Hopeless 0 0 1 0 0  PHQ - 2 Score 1 1 2  0 0  Altered sleeping 2 1 1  - -  Tired, decreased energy 1 1 2  - -  Change in appetite 0 1 0 - -  Feeling bad or failure about yourself  1 1 1  - -  Trouble concentrating 0 0 0 - -  Moving slowly or fidgety/restless 0 1 1 - -  Suicidal thoughts 0 0 0 - -  PHQ-9 Score 5 6 7  - -  Difficult doing work/chores Somewhat difficult Somewhat difficult Somewhat difficult - -   PHQ-2/9 Result is positive.    Fall Risk: Fall Risk  04/25/2019 01/24/2019 01/24/2019 10/19/2018 10/25/2017  Falls in the past year? 0 0 0 No No  Number falls in past yr: 0 - 0 - -  Injury with Fall? 0 - - - -     Assessment & Plan  1. Gastroesophageal  reflux disease without esophagitis  - esomeprazole (NEXIUM) 20 MG capsule; Take 1 capsule (20 mg total) by mouth daily.  Dispense: 90 capsule; Refill: 1  2. Mild depression ([place])  Discussed the need  to go for walks, refuses medications  3. Other iron deficiency anemia  Keep follow up with Dr. Durwin Reges   4. Recurrent UTI  Under the care of Urologist   5. Prediabetes  Recheck labs next visit   6. Hyperglycemia   7. Lower extremity edema  Continue compression stoking hoses  8. Tension headache  Still has medication at home  9. Hypertriglyceridemia   I discussed the assessment and treatment plan with the patient. The patient was provided an opportunity to ask questions and all were answered. The patient agreed with the plan and demonstrated an understanding of the instructions.   The patient was advised to call back or seek an in-person evaluation if the symptoms worsen or if the condition fails to improve as anticipated.  I provided 25  minutes of non-face-to-face time during this encounter.  Loistine Chance, MD

## 2019-04-26 ENCOUNTER — Ambulatory Visit: Payer: Medicare Other | Admitting: Family Medicine

## 2019-06-23 ENCOUNTER — Ambulatory Visit
Admission: RE | Admit: 2019-06-23 | Discharge: 2019-06-23 | Disposition: A | Discharge status: Home / Self Care | Payer: Medicare Other | Source: Ambulatory Visit | Attending: Family Medicine | Admitting: Family Medicine

## 2019-06-23 ENCOUNTER — Other Ambulatory Visit: Payer: Self-pay

## 2019-06-23 DIAGNOSIS — Z780 Asymptomatic menopausal state: Secondary | ICD-10-CM

## 2019-06-23 DIAGNOSIS — Z1231 Encounter for screening mammogram for malignant neoplasm of breast: Secondary | ICD-10-CM | POA: Diagnosis present

## 2019-07-26 ENCOUNTER — Other Ambulatory Visit: Payer: Self-pay

## 2019-07-26 ENCOUNTER — Ambulatory Visit (INDEPENDENT_AMBULATORY_CARE_PROVIDER_SITE_OTHER): Payer: Medicare Other | Admitting: Family Medicine

## 2019-07-26 ENCOUNTER — Encounter: Payer: Self-pay | Admitting: Family Medicine

## 2019-07-26 VITALS — Temp 97.9°F

## 2019-07-26 DIAGNOSIS — M5412 Radiculopathy, cervical region: Secondary | ICD-10-CM | POA: Diagnosis not present

## 2019-07-26 DIAGNOSIS — K219 Gastro-esophageal reflux disease without esophagitis: Secondary | ICD-10-CM

## 2019-07-26 DIAGNOSIS — D508 Other iron deficiency anemias: Secondary | ICD-10-CM

## 2019-07-26 DIAGNOSIS — I208 Other forms of angina pectoris: Secondary | ICD-10-CM | POA: Diagnosis not present

## 2019-07-26 DIAGNOSIS — E559 Vitamin D deficiency, unspecified: Secondary | ICD-10-CM

## 2019-07-26 DIAGNOSIS — F320 Major depressive disorder, single episode, mild: Secondary | ICD-10-CM | POA: Diagnosis not present

## 2019-07-26 DIAGNOSIS — R739 Hyperglycemia, unspecified: Secondary | ICD-10-CM

## 2019-07-26 DIAGNOSIS — Z79899 Other long term (current) drug therapy: Secondary | ICD-10-CM

## 2019-07-26 DIAGNOSIS — F32A Depression, unspecified: Secondary | ICD-10-CM

## 2019-07-26 DIAGNOSIS — M8580 Other specified disorders of bone density and structure, unspecified site: Secondary | ICD-10-CM

## 2019-07-26 DIAGNOSIS — M810 Age-related osteoporosis without current pathological fracture: Secondary | ICD-10-CM

## 2019-07-26 DIAGNOSIS — R7303 Prediabetes: Secondary | ICD-10-CM

## 2019-07-26 MED ORDER — BACLOFEN 10 MG PO TABS: 10.0000 mg | ORAL_TABLET | Freq: Three times a day (TID) | ORAL | 0 refills | Status: DC | PRN

## 2019-07-26 MED ORDER — PREGABALIN 75 MG PO CAPS: 75.0000 mg | ORAL_CAPSULE | Freq: Two times a day (BID) | ORAL | 0 refills | Status: DC

## 2019-07-26 MED ORDER — ESOMEPRAZOLE MAGNESIUM 20 MG PO CPDR: 20.0000 mg | DELAYED_RELEASE_CAPSULE | Freq: Every day | ORAL | 1 refills | Status: DC

## 2019-07-26 MED ORDER — ALENDRONATE SODIUM 70 MG PO TABS: 70.0000 mg | ORAL_TABLET | ORAL | 3 refills | Status: DC

## 2019-07-26 NOTE — Progress Notes (Signed)
Name: Christina Salas   MRN: [id number]    DOB: August 27, 1933   Date:07/26/2019       Progress Note  Subjective  Chief Complaint  Chief Complaint  Patient presents with  . Medication Refill  . Depression  . Gastroesophageal Reflux  . Shoulder Pain    Onset- right shoulder and neck pain for the past 2 to 3 days last week. It finally ease up Saturday.     I connected with  Hanya I Caisse on 07/26/19 at 10:20 AM EDT by telephone and verified that I am speaking with the correct person using two identifiers.  I discussed the limitations, risks, security and privacy concerns of performing an evaluation and management service by telephone and the availability of in person appointments. Staff also discussed with the patient that there may be a patient responsible charge related to this service. Patient Location: at home Provider Location: [place]   HPI   Neck pain : she states she has recurrent pain that goes from right side of neck to shoulder and upper arm, sometimes is numb, otherwise just aching like, she states last week was worse than usual but improved since. She states during the winter she has a device that helps keep her keep her shoulders up and improves her posture and symptoms, however too hot during the Summer months  Iron deficiency anemia: foundin 2018,she finally had some change in bowel movements in 2019 and agreed on seeing GI, she had EGD and colonoscopy Dec 2019 done by Dr. Durwin Reges and still no source for the anemia, not currently taking iron supplementation, she states waiting to see if she will get virtual colonoscopy done, postponed because of COVID-19 . Reviewed last labs with patient . She denies pica , she has fatigue but no SOB  Angina at rest: she states symptoms were worseSpring of 2017went to Dr. Clayborn Bigness and had a normal stress test. She states her heart gurgles intermittently, she has SOB with mild/moderateactivity. Symptoms are  unchanged. Taking aspirin No palpitations at this time. She saw Dr. Clayborn Bigness yesterday and follow up will be in one year  Pre-diabetes: she denies polyphagia, polydipsia, she states no change in urinary frequency.last hgbA1C up to 6.3%. , discussed her coming in for labs only next week   Osteopenia: high risk of FRAX 20.8 % for overall osteoporotic fracture and 5.6% for hip fracture, she is taking vitamin D, we will recheck level, discussed Alendronate She does not have problems with her teeth She took Fosamax back in the 90's and is willing to resume medication. Last time she took it was 15 years ago   Edema extremities: she has not been walking as much because of COVID-19 but is wearing compression stoking hoses and is doing well at this time. Advised to walk around her house to keep her strength going   GERD:she is taking Nexium 20 mg most days but sometimes she needs to take two daily,advised to take Tums or Pepcid prn if needed. She denies heartburn, but has indigestion. We will send refills   Headaches: dull aching pain usually frontal, occasionally temporal and sharp pain, intermittently and only takes medications prneither fioricet or excedrin, advised to avoid taking pain medication daily because it can cause rebound headache. No photophobia or phonophobia. Still taking medication prn   History of breast cancer: last mammogram reviewed and normal   Depression: she has noticed lack of motivation, she never started Celexa, she states she was feeling better until recently  because of shoulder pain, she states she wants to have shoulder pain under control to be able to help her be more active  Urinary incontinence and recurrent UTI's: she used to see Dr. Jacqlyn Larsen, he used to give her macrobid 44 with refills to take prn, advised to follow up with another Urologist ,she is under the care of Dr. Junious Silk now. Unchanged    Patient Active Problem List   Diagnosis Date Noted  . Mild  depression ([place]) 01/24/2019  . Large hiatal hernia 01/24/2019  . Internal hemorrhoids 01/24/2019  . Iron deficiency anemia due to chronic blood loss   . Venous reflux 04/19/2018  . Swelling of limb 04/27/2017  . Prediabetes 06/08/2016  . Obesity (BMI 30.0-34.9) 06/08/2016  . Vitamin D deficiency 06/08/2016  . Post-menopausal 12/11/2015  . DD (diverticular disease) 06/19/2015  . Edema extremities 06/19/2015  . Esophageal reflux 06/19/2015  . HLD (hyperlipidemia) 06/19/2015  . Varicose veins of leg with swelling, bilateral 06/19/2015  . Recurrent UTI 02/08/2013  . Mixed incontinence 02/08/2013  . History of breast cancer, left, T1, N0, Left MRM - Dec 1996 03/18/2012  . Tension headache 05/24/2007  . History of melanoma 08/28/1994    Past Surgical History:  Procedure Laterality Date  . ABDOMINAL HYSTERECTOMY  2001  . BREAST BIOPSY  2010  . BREAST EXCISIONAL BIOPSY Right 1997   Benign excisional biopsy   . COLONOSCOPY  04/02/2014  . COLONOSCOPY WITH PROPOFOL N/A 12/27/2018   Procedure: COLONOSCOPY WITH PROPOFOL;  Surgeon: Lucilla Lame, MD;  Location: [facility] ENDOSCOPY;  Service: Endoscopy;  Laterality: N/A;  . CYSTOSCOPY  11/2014  . ESOPHAGOGASTRODUODENOSCOPY (EGD) WITH PROPOFOL N/A 12/27/2018   Procedure: ESOPHAGOGASTRODUODENOSCOPY (EGD) WITH PROPOFOL;  Surgeon: Lucilla Lame, MD;  Location: [facility] ENDOSCOPY;  Service: Endoscopy;  Laterality: N/A;  . MASTECTOMY  1996   Left  . MELANOMA EXCISION  1995   Head    Family History  Problem Relation Age of Onset  . Breast cancer Mother   . Migraines Mother   . Diabetes Father   . CAD Father   . Thrombosis Father   . Biliary Cirrhosis Brother   . Breast cancer Other        Niece    Social History   Socioeconomic History  . Marital status: Single    Spouse name: Not on file  . Number of children: 0  . Years of education: Not on file  . Highest education level: Master's degree (e.g., MA, MS, MEng, MEd, MSW, MBA)  Occupational  History  . Occupation: Retired  Scientific laboratory technician  . Financial resource strain: Not hard at all  . Food insecurity    Worry: Never true    Inability: Never true  . Transportation needs    Medical: No    Non-medical: No  Tobacco Use  . Smoking status: Never Smoker  . Smokeless tobacco: Never Used  Substance and Sexual Activity  . Alcohol use: No    Alcohol/week: 0.0 standard drinks  . Drug use: Never  . Sexual activity: Not Currently  Lifestyle  . Physical activity    Days per week: 0 days    Minutes per session: 0 min  . Stress: Only a little  Relationships  . Social connections    Talks on phone: More than three times a week    Gets together: Three times a week    Attends religious service: More than 4 times per year    Active member of club or organization: No  Attends meetings of clubs or organizations: Never    Relationship status: Never married  . Intimate partner violence    Fear of current or ex partner: No    Emotionally abused: No    Physically abused: No    Forced sexual activity: No  Other Topics Concern  . 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  butalbital-acetaminophen-caffeine (FIORICET, ESGIC) 50-325-40 MG tablet, Take 1 tablet by mouth every 6 (six) hours as needed (as needed)., Disp: 20 tablet, Rfl: 0 .  Calcium-Magnesium 500-250 MG TABS, Take 2 tablets by mouth 2 (two) times daily., Disp: , Rfl:  .  CINNAMON PO, Take 1 capsule by mouth daily. 350 mg, Disp: , Rfl:  .  Coenzyme Q10 (COQ10) 30 MG CAPS, Take 1 capsule by mouth every other day., Disp: , Rfl:  .  Cranberry ([employer] CRANBERRY) 500 MG CAPS, Take 1 tablet by mouth daily., Disp: , Rfl:  .  Elastic Bandages & Supports (ULTRA SUPPORT SPINAL ORTHOSIS) MISC, Wear upper back (thoracic) support brace daily., Disp: , Rfl:  .  esomeprazole (NEXIUM) 20  MG capsule, Take 1 capsule (20 mg total) by mouth daily., Disp: 90 capsule, Rfl: 1 .  Misc Natural Products (LEG VEIN & CIRCULATION) TABS, Take 2 capsules by mouth daily., Disp: , Rfl:  .  Multiple Vitamins-Calcium (ESSENTIAL ONE DAILY MULTIVIT) TABS, Take by mouth., Disp: , Rfl:  .  Multiple Vitamins-Minerals (EYE SUPPORT PO), Take 1 capsule by mouth daily., Disp: , Rfl:  .  nitrofurantoin (MACRODANTIN) 100 MG capsule, Take 100 mg by mouth as needed.  , Disp: , Rfl:  .  Omega-3 1000 MG CAPS, Take 1 capsule by mouth 2 (two) times a day. , Disp: , Rfl:  .  Turmeric 400 MG CAPS, Take 1 capsule by mouth daily., Disp: , Rfl:   No Known Allergies  I personally reviewed active problem list, medication list, allergies, family history, social history with the patient/caregiver today.   ROS  Constitutional: Negative for fever or weight change.  Respiratory: Negative for cough and shortness of breath.   Cardiovascular: Negative for chest pain or palpitations.  Gastrointestinal: Negative for abdominal pain, no bowel changes.  Musculoskeletal: Negative for gait problem or joint swelling.  Skin: Negative for rash.  Neurological: Negative for dizziness or headache.  No other specific complaints in a complete review of systems (except as listed in HPI above).   Objective  Virtual encounter, vitals not obtained.  There is no height or weight on file to calculate BMI.  Physical Exam  Awake, alert and oriented  PHQ2/9: Depression screen [facility] 2/9 07/26/2019 04/25/2019 01/24/2019 10/19/2018 10/25/2017  Decreased Interest 1 1 1 1  0  Down, Depressed, Hopeless 1 0 0 1 0  PHQ - 2 Score 2 1 1 2  0  Altered sleeping 1 2 1 1  -  Tired, decreased energy 1 1 1 2  -  Change in appetite 1 0 1 0 -  Feeling bad or failure about yourself  0 1 1 1  -  Trouble concentrating 0 0 0 0 -  Moving slowly or fidgety/restless 0 0 1 1 -  Suicidal thoughts 0 0 0 0 -  PHQ-9 Score 5 5 6 7  -  Difficult doing work/chores  Somewhat difficult Somewhat difficult Somewhat difficult  Somewhat difficult -   PHQ-2/9 Result is positive.    Fall Risk: Fall Risk  07/26/2019 04/25/2019 01/24/2019 01/24/2019 10/19/2018  Falls in the past year? 0 0 0 0 No  Number falls in past yr: 0 0 - 0 -  Injury with Fall? 0 0 - - -     Assessment & Plan  1. Cervical radiculopathy  - pregabalin (LYRICA) 75 MG capsule; Take 1 capsule (75 mg total) by mouth 2 (two) times daily.  Dispense: 60 capsule; Refill: 0 - baclofen (LIORESAL) 10 MG tablet; Take 1 tablet (10 mg total) by mouth 3 (three) times daily as needed for muscle spasms.  Dispense: 30 each; Refill: 0  2. Osteopenia after menopause  - alendronate (FOSAMAX) 70 MG tablet; Take 1 tablet (70 mg total) by mouth every 7 (seven) days. Take with a full glass of water on an empty stomach.  Dispense: 12 tablet; Refill: 3  3. Angina at rest [facility])   4. Mild depression (HCC)  Refuses medication, states feeling better  5. Hyperglycemia  - Hemoglobin A1c  6. Vitamin D deficiency  - VITAMIN D 25 Hydroxy (Vit-D Deficiency, Fractures)  7. Other iron deficiency anemia  - CBC with Differential/Platelet - Iron, TIBC and Ferritin Panel  8. Gastroesophageal reflux disease without esophagitis  - esomeprazole (NEXIUM) 20 MG capsule; Take 1-2 capsules (20-40 mg total) by mouth daily.  Dispense: 100 capsule; Refill: 1  9. Prediabetes   10. Long-term use of high-risk medication  - COMPLETE METABOLIC PANEL WITH GFR I discussed the assessment and treatment plan with the patient. The patient was provided an opportunity to ask questions and all were answered. The patient agreed with the plan and demonstrated an understanding of the instructions.   The patient was advised to call back or seek an in-person evaluation if the symptoms worsen or if the condition fails to improve as anticipated.  I provided 25 minutes of non-face-to-face time during this encounter.  Loistine Chance, MD

## 2019-07-28 ENCOUNTER — Ambulatory Visit: Payer: Medicare Other | Admitting: Family Medicine

## 2019-08-17 ENCOUNTER — Ambulatory Visit: Payer: Medicare Other | Admitting: Family Medicine

## 2019-08-18 ENCOUNTER — Other Ambulatory Visit: Payer: Self-pay

## 2019-08-18 ENCOUNTER — Ambulatory Visit (INDEPENDENT_AMBULATORY_CARE_PROVIDER_SITE_OTHER): Payer: Medicare Other | Admitting: Family Medicine

## 2019-08-18 ENCOUNTER — Encounter: Payer: Self-pay | Admitting: Family Medicine

## 2019-08-18 VITALS — BP 134/72 | HR 94 | Temp 97.1°F | Resp 16 | Ht 62.0 in | Wt 177.1 lb

## 2019-08-18 DIAGNOSIS — G44209 Tension-type headache, unspecified, not intractable: Secondary | ICD-10-CM | POA: Diagnosis not present

## 2019-08-18 DIAGNOSIS — Z23 Encounter for immunization: Secondary | ICD-10-CM | POA: Diagnosis not present

## 2019-08-18 DIAGNOSIS — M810 Age-related osteoporosis without current pathological fracture: Secondary | ICD-10-CM | POA: Diagnosis not present

## 2019-08-18 DIAGNOSIS — M5412 Radiculopathy, cervical region: Secondary | ICD-10-CM | POA: Diagnosis not present

## 2019-08-18 DIAGNOSIS — F32A Depression, unspecified: Secondary | ICD-10-CM

## 2019-08-18 DIAGNOSIS — F320 Major depressive disorder, single episode, mild: Secondary | ICD-10-CM | POA: Diagnosis not present

## 2019-08-18 DIAGNOSIS — Z780 Asymptomatic menopausal state: Secondary | ICD-10-CM

## 2019-08-18 MED ORDER — BUTALBITAL-APAP-CAFFEINE 50-325-40 MG PO TABS: 1.0000 | ORAL_TABLET | Freq: Four times a day (QID) | ORAL | 0 refills | Status: DC | PRN

## 2019-08-18 NOTE — Progress Notes (Signed)
Name: Christina Salas   MRN: [id number]    DOB: [birth year]   Date:08/18/2019       Progress Note  Subjective  Chief Complaint  Chief Complaint  Patient presents with  . Cervical radiculopathy    Medication helped the pain now it is more just a discomfort in her right side of her neck radiating down to top of her back  . Follow-up    1 month F/U    HPI  Cervical Radiculopathy: she never started taking Lyrica, but states pain has improved with baclofen prn, still feels stiff at times but pain is not as intense, and occasionally tingling down to right shoulder. No weakness . Currently feeling well. She states she has the Lyrica at home but not taking it. She will let us know when she decides to see Dr. Vertell Limber at [facility]  Tension headache: she is taking fioricet prn and needs a refill, she describes headaches as dull and or sharp, not as frequent now, she states baclofen helps. Usually frontal, very seldom gest nausea or vomiting   Osteopenia: she refuses to go back on Alendronate, she is worried about side effects.   Mild Depression: she has long history of depression, she states not feeling great right now, denies suicidal thoughts or ideation, but has lack of energy, change in appetite and phq 9 is higher but refuses medications   Patient Active Problem List   Diagnosis Date Noted  . Mild depression ([place]) 01/24/2019  . Large hiatal hernia 01/24/2019  . Internal hemorrhoids 01/24/2019  . Iron deficiency anemia due to chronic blood loss   . Venous reflux 04/19/2018  . Swelling of limb 04/27/2017  . Prediabetes 06/08/2016  . Obesity (BMI 30.0-34.9) 06/08/2016  . Vitamin D deficiency 06/08/2016  . Post-menopausal 12/11/2015  . DD (diverticular disease) 06/19/2015  . Edema extremities 06/19/2015  . Esophageal reflux 06/19/2015  . HLD (hyperlipidemia) 06/19/2015  . Varicose veins of leg with swelling, bilateral 06/19/2015  . Recurrent UTI 02/08/2013  . Mixed  incontinence 02/08/2013  . History of breast cancer, left, T1, N0, Left MRM - Dec 1996 03/18/2012  . Tension headache 05/24/2007  . History of melanoma 08/28/1994    Past Surgical History:  Procedure Laterality Date  . ABDOMINAL HYSTERECTOMY  2001  . BREAST BIOPSY  2010  . BREAST EXCISIONAL BIOPSY Right 1997   Benign excisional biopsy   . COLONOSCOPY  04/02/2014  . COLONOSCOPY WITH PROPOFOL N/A 12/27/2018   Procedure: COLONOSCOPY WITH PROPOFOL;  Surgeon: Lucilla Lame, MD;  Location: [facility] ENDOSCOPY;  Service: Endoscopy;  Laterality: N/A;  . CYSTOSCOPY  11/2014  . ESOPHAGOGASTRODUODENOSCOPY (EGD) WITH PROPOFOL N/A 12/27/2018   Procedure: ESOPHAGOGASTRODUODENOSCOPY (EGD) WITH PROPOFOL;  Surgeon: Lucilla Lame, MD;  Location: [facility] ENDOSCOPY;  Service: Endoscopy;  Laterality: N/A;  . MASTECTOMY  1996   Left  . MELANOMA EXCISION  1995   Head    Family History  Problem Relation Age of Onset  . Breast cancer Mother   . Migraines Mother   . Diabetes Father   . CAD Father   . Thrombosis Father   . Biliary Cirrhosis Brother   . Breast cancer Other        Niece    Social History   Socioeconomic History  . Marital status: Single    Spouse name: Not on file  . Number of children: 0  . Years of education: Not on file  . Highest education level: Master's degree (e.g., MA, MS, MEng, MEd,  MSW, MBA)  Occupational History  . Occupation: Retired  Scientific laboratory technician  . Financial resource strain: Not hard at all  . Food insecurity    Worry: Never true    Inability: Never true  . Transportation needs    Medical: No    Non-medical: No  Tobacco Use  . Smoking status: Never Smoker  . Smokeless tobacco: Never Used  Substance and Sexual Activity  . Alcohol use: No    Alcohol/week: 0.0 standard drinks  . Drug use: Never  . Sexual activity: Not Currently  Lifestyle  . Physical activity    Days per week: 0 days    Minutes per session: 0 min  . Stress: Only a little  Relationships  .  Social connections    Talks on phone: More than three times a week    Gets together: Three times a week    Attends religious service: More than 4 times per year    Active member of club or organization: No    Attends meetings of clubs or organizations: Never    Relationship status: Never married  . Intimate partner violence    Fear of current or ex partner: No    Emotionally abused: No    Physically abused: No    Forced sexual activity: No  Other Topics Concern  . 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  baclofen (LIORESAL) 10 MG tablet, Take 1 tablet (10 mg total) by mouth 3 (three) times daily as needed for muscle spasms., Disp: 30 each, Rfl: 0 .  Calcium-Magnesium 500-250 MG TABS, Take 2 tablets by mouth 2 (two) times daily., Disp: , Rfl:  .  CINNAMON PO, Take 1 capsule by mouth daily. 350 mg, Disp: , Rfl:  .  Coenzyme Q10 (COQ10) 30 MG CAPS, Take 1 capsule by mouth every other day., Disp: , Rfl:  .  Cranberry ([employer] CRANBERRY) 500 MG CAPS, Take 1 tablet by mouth daily., Disp: , Rfl:  .  Elastic Bandages & Supports (ULTRA SUPPORT SPINAL ORTHOSIS) MISC, Wear upper back (thoracic) support brace daily., Disp: , Rfl:  .  esomeprazole (NEXIUM) 20 MG capsule, Take 1-2 capsules (20-40 mg total) by mouth daily., Disp: 100 capsule, Rfl: 1 .  Misc Natural Products (LEG VEIN & CIRCULATION) TABS, Take 2 capsules by mouth daily., Disp: , Rfl:  .  Multiple Vitamins-Calcium (ESSENTIAL ONE DAILY MULTIVIT) TABS, Take by mouth., Disp: , Rfl:  .  Multiple Vitamins-Minerals (EYE SUPPORT PO), Take 1 capsule by mouth daily., Disp: , Rfl:  .  nitrofurantoin (MACRODANTIN) 100 MG capsule, Take 100 mg by mouth as needed.  , Disp: , Rfl:  .  Omega-3 1000 MG CAPS, Take 1 capsule by mouth 2 (two) times a day. , Disp: , Rfl:  .  alendronate (FOSAMAX) 70 MG  tablet, Take 1 tablet (70 mg total) by mouth every 7 (seven) days. Take with a full glass of water on an empty stomach. (Patient not taking: Reported on 08/18/2019), Disp: 12 tablet, Rfl: 3 .  butalbital-acetaminophen-caffeine (FIORICET, ESGIC) 50-325-40 MG tablet, Take 1 tablet by mouth every 6 (six) hours as needed (as needed). (Patient not taking: Reported on 08/18/2019), Disp: 20 tablet, Rfl: 0 .  pregabalin (LYRICA) 75 MG capsule, Take 1 capsule (75 mg total) by  mouth 2 (two) times daily. (Patient not taking: Reported on 08/18/2019), Disp: 60 capsule, Rfl: 0 .  Turmeric 400 MG CAPS, Take 1 capsule by mouth daily., Disp: , Rfl:   No Known Allergies  I personally reviewed active problem list, medication list, allergies, family history, social history with the patient/caregiver today.   ROS  Ten systems reviewed and is negative except as mentioned in HPI  Objective  Vitals:   08/18/19 1429  BP: 134/72  Pulse: 94  Resp: 16  Temp: (!) 97.1 F (36.2 C)  TempSrc: Temporal  SpO2: 97%  Weight: 177 lb 1.6 oz (80.3 kg)  Height: 5\' 2"  (1.575 m)    Body mass index is 32.39 kg/m.  Physical Exam  Constitutional: Patient appears well-developed and well-nourished. Obese  No distress.  HEENT: head atraumatic, normocephalic, pupils equal and reactive to light Cardiovascular: Normal rate, regular rhythm and normal heart sounds.  No murmur heard. No BLE edema. Pulmonary/Chest: Effort normal and breath sounds normal. No respiratory distress. Abdominal: Soft.  There is no tenderness. Muscular skeletal: walks slowly,  Neck has decrease in extension, normal grip, normal sensation on both arms Psychiatric: Patient has a normal mood and affect. behavior is normal. Judgment and thought content normal.  PHQ2/9: Depression screen [facility] 2/9 08/18/2019 07/26/2019 04/25/2019 01/24/2019 10/19/2018  Decreased Interest 1 1 1 1 1   Down, Depressed, Hopeless 1 1 0 0 1  PHQ - 2 Score 2 2 1 1 2   Altered sleeping 1  1 2 1 1   Tired, decreased energy 2 1 1 1 2   Change in appetite 1 1 0 1 0  Feeling bad or failure about yourself  1 0 1 1 1   Trouble concentrating 0 0 0 0 0  Moving slowly or fidgety/restless 2 0 0 1 1  Suicidal thoughts - 0 0 0 0  PHQ-9 Score 9 5 5 6 7   Difficult doing work/chores Somewhat difficult Somewhat difficult Somewhat difficult Somewhat difficult Somewhat difficult    phq 9 is positive   Fall Risk: Fall Risk  08/18/2019 07/26/2019 04/25/2019 01/24/2019 01/24/2019  Falls in the past year? 0 0 0 0 0  Number falls in past yr: 0 0 0 - 0  Injury with Fall? 0 0 0 - -     Functional Status Survey: Is the patient deaf or have difficulty hearing?: Yes Does the patient have difficulty seeing, even when wearing glasses/contacts?: Yes Does the patient have difficulty concentrating, remembering, or making decisions?: No Does the patient have difficulty walking or climbing stairs?: No Does the patient have difficulty dressing or bathing?: No Does the patient have difficulty doing errands alone such as visiting a doctor's office or shopping?: No    Assessment & Plan  1. Tension headache  - butalbital-acetaminophen-caffeine (FIORICET) 50-325-40 MG tablet; Take 1 tablet by mouth every 6 (six) hours as needed (as needed).  Dispense: 20 tablet; Refill: 0  2. Cervical radiculopathy  Doing better   3. Mild depression ([place])  She refuses to take medication, afraid of side effects  4. Osteopenia after menopause  She is worried about side effects   5. Needs flu shot  High dose flu today

## 2019-08-19 LAB — CBC WITH DIFFERENTIAL/PLATELET
Absolute Monocytes: 333 cells/uL (ref 200–950)
Basophils Absolute: 20 cells/uL (ref 0–200)
Basophils Relative: 0.3 %
Eosinophils Absolute: 109 cells/uL (ref 15–500)
Eosinophils Relative: 1.6 %
HCT: 39.7 % (ref 35.0–45.0)
Hemoglobin: 12.6 g/dL (ref 11.7–15.5)
Lymphs Abs: 2802 cells/uL (ref 850–3900)
MCH: 24.8 pg — ABNORMAL LOW (ref 27.0–33.0)
MCHC: 31.7 g/dL — ABNORMAL LOW (ref 32.0–36.0)
MCV: 78.1 fL — ABNORMAL LOW (ref 80.0–100.0)
MPV: 10.8 fL (ref 7.5–12.5)
Monocytes Relative: 4.9 %
Neutro Abs: 3536 cells/uL (ref 1500–7800)
Neutrophils Relative %: 52 %
Platelets: 243 10*3/uL (ref 140–400)
RBC: 5.08 10*6/uL (ref 3.80–5.10)
RDW: 17.5 % — ABNORMAL HIGH (ref 11.0–15.0)
Total Lymphocyte: 41.2 %
WBC: 6.8 10*3/uL (ref 3.8–10.8)

## 2019-08-19 LAB — COMPLETE METABOLIC PANEL WITH GFR
AG Ratio: 1.4 (calc) (ref 1.0–2.5)
ALT: 17 U/L (ref 6–29)
AST: 19 U/L (ref 10–35)
Albumin: 4.3 g/dL (ref 3.6–5.1)
Alkaline phosphatase (APISO): 62 U/L (ref 37–153)
BUN: 22 mg/dL (ref 7–25)
CO2: 24 mmol/L (ref 20–32)
Calcium: 9.8 mg/dL (ref 8.6–10.4)
Chloride: 105 mmol/L (ref 98–110)
Creat: 0.85 mg/dL (ref 0.60–0.88)
GFR, Est African American: 73 mL/min/{1.73_m2} (ref >=60)
GFR, Est Non African American: 63 mL/min/{1.73_m2} (ref >=60)
Globulin: 3 g/dL (calc) (ref 1.9–3.7)
Glucose, Bld: 106 mg/dL — ABNORMAL HIGH (ref 65–99)
Potassium: 4.3 mmol/L (ref 3.5–5.3)
Sodium: 141 mmol/L (ref 135–146)
Total Bilirubin: 0.3 mg/dL (ref 0.2–1.2)
Total Protein: 7.3 g/dL (ref 6.1–8.1)

## 2019-08-19 LAB — HEMOGLOBIN A1C
Hgb A1c MFr Bld: 6.3 % of total Hgb — ABNORMAL HIGH (ref <5.7)
Mean Plasma Glucose: 134 (calc)
eAG (mmol/L): 7.4 (calc)

## 2019-08-19 LAB — IRON,TIBC AND FERRITIN PANEL
%SAT: 8 % (calc) — ABNORMAL LOW (ref 16–45)
Ferritin: 9 ng/mL — ABNORMAL LOW (ref 16–288)
Iron: 39 ug/dL — ABNORMAL LOW (ref 45–160)
TIBC: 478 mcg/dL (calc) — ABNORMAL HIGH (ref 250–450)

## 2019-08-19 LAB — VITAMIN D 25 HYDROXY (VIT D DEFICIENCY, FRACTURES): Vit D, 25-Hydroxy: 32 ng/mL (ref 30–100)

## 2019-08-24 ENCOUNTER — Ambulatory Visit: Payer: Medicare Other | Admitting: Urology

## 2019-09-06 ENCOUNTER — Ambulatory Visit (INDEPENDENT_AMBULATORY_CARE_PROVIDER_SITE_OTHER): Payer: Medicare Other | Admitting: Urology

## 2019-09-06 ENCOUNTER — Other Ambulatory Visit: Payer: Self-pay

## 2019-09-06 ENCOUNTER — Encounter: Payer: Self-pay | Admitting: Urology

## 2019-09-06 VITALS — BP 121/68 | HR 93 | Ht 63.0 in | Wt 175.0 lb

## 2019-09-06 DIAGNOSIS — R3915 Urgency of urination: Secondary | ICD-10-CM

## 2019-09-06 DIAGNOSIS — N3020 Other chronic cystitis without hematuria: Secondary | ICD-10-CM | POA: Diagnosis not present

## 2019-09-06 MED ORDER — NITROFURANTOIN MACROCRYSTAL 100 MG PO CAPS: 100.0000 mg | ORAL_CAPSULE | ORAL | 0 refills | Status: DC | PRN

## 2019-09-06 NOTE — Patient Instructions (Signed)
Urinary Tract Infection, Adult A urinary tract infection (UTI) is an infection of any part of the urinary tract. The urinary tract includes:  The kidneys.  The ureters.  The bladder.  The urethra. These organs make, store, and get rid of pee (urine) in the body. What are the causes? This is caused by germs (bacteria) in your genital area. These germs grow and cause swelling (inflammation) of your urinary tract. What increases the risk? You are more likely to develop this condition if:  You have a small, thin tube (catheter) to drain pee.  You cannot control when you pee or poop (incontinence).  You are female, and: ? You use these methods to prevent pregnancy: ? A medicine that kills sperm (spermicide). ? A device that blocks sperm (diaphragm). ? You have low levels of a female hormone (estrogen). ? You are pregnant.  You have genes that add to your risk.  You are sexually active.  You take antibiotic medicines.  You have trouble peeing because of: ? A prostate that is bigger than normal, if you are female. ? A blockage in the part of your body that drains pee from the bladder (urethra). ? A kidney stone. ? A nerve condition that affects your bladder (neurogenic bladder). ? Not getting enough to drink. ? Not peeing often enough.  You have other conditions, such as: ? Diabetes. ? A weak disease-fighting system (immune system). ? Sickle cell disease. ? Gout. ? Injury of the spine. What are the signs or symptoms? Symptoms of this condition include:  Needing to pee right away (urgently).  Peeing often.  Peeing small amounts often.  Pain or burning when peeing.  Blood in the pee.  Pee that smells bad or not like normal.  Trouble peeing.  Pee that is cloudy.  Fluid coming from the vagina, if you are female.  Pain in the belly or lower back. Other symptoms include:  Throwing up (vomiting).  No urge to eat.  Feeling mixed up (confused).  Being tired  and grouchy (irritable).  A fever.  Watery poop (diarrhea). How is this treated? This condition may be treated with:  Antibiotic medicine.  Other medicines.  Drinking enough water. Follow these instructions at home:  Medicines  Take over-the-counter and prescription medicines only as told by your doctor.  If you were prescribed an antibiotic medicine, take it as told by your doctor. Do not stop taking it even if you start to feel better. General instructions  Make sure you: ? Pee until your bladder is empty. ? Do not hold pee for a long time. ? Empty your bladder after sex. ? Wipe from front to back after pooping if you are a female. Use each tissue one time when you wipe.  Drink enough fluid to keep your pee pale yellow.  Keep all follow-up visits as told by your doctor. This is important. Contact a doctor if:  You do not get better after 1-2 days.  Your symptoms go away and then come back. Get help right away if:  You have very bad back pain.  You have very bad pain in your lower belly.  You have a fever.  You are sick to your stomach (nauseous).  You are throwing up. Summary  A urinary tract infection (UTI) is an infection of any part of the urinary tract.  This condition is caused by germs in your genital area.  There are many risk factors for a UTI. These include having a small, thin   tube to drain pee and not being able to control when you pee or poop.  Treatment includes antibiotic medicines for germs.  Drink enough fluid to keep your pee pale yellow. This information is not intended to replace advice given to you by your health care provider. Make sure you discuss any questions you have with your health care provider. Document Released: 06/01/2008 Document Revised: 12/01/2018 Document Reviewed: 06/23/2018 Elsevier Patient Education  2020 Elsevier Inc.  

## 2019-09-06 NOTE — Progress Notes (Signed)
09/06/2019 9:52 AM   Christina Salas 03/19/1933 [id number]  Referring provider: Steele Sizer, MD [address] [place],  [place] [address]  Chief Complaint  Patient presents with  . Urinary Incontinence    40month follow up    HPI:  F/u -bacteriuria, urgency and urge incontinence --   1) recurrent UTI. She typically gets bladder pain and dysuria, but recently she has been more asymptomatic. She followed with Dr. Jacqlyn Larsen and last saw him in 2018 and was doing well on nitrofurantoin as needed.  She takes NF sparingly.   2) mixed incontinence with frequency and urgency.  She voids with a good flow. Occasional urgency and UUI.   Postvoid residual was 0. She typically drinks milk and a diet orange drink. Not bothered. She had a hysterectomy. No NG risk.   She returns and hasn't had any issues. She had one episode of frequency. She took an Azo. She took one or two NF. No bladder pain or gross hematuria.   PMH: Past Medical History:  Diagnosis Date  . Abnormal glucose   . Anxiety   . Arthritis   . Breast cancer ([place])   . Cancer ([place])    melanoma  . Chronic headaches   . Depression   . GERD (gastroesophageal reflux disease)   . H/O melanoma excision   . History of diverticulitis   . History of recurrent UTIs   . Osteoporosis    pre-osetoporosis per patient history form 03/18/12  . Pre-diabetes   . Vitamin D deficiency     Surgical History: Past Surgical History:  Procedure Laterality Date  . ABDOMINAL HYSTERECTOMY  2001  . BREAST BIOPSY  2010  . BREAST EXCISIONAL BIOPSY Right 1997   Benign excisional biopsy   . COLONOSCOPY  04/02/2014  . COLONOSCOPY WITH PROPOFOL N/A 12/27/2018   Procedure: COLONOSCOPY WITH PROPOFOL;  Surgeon: Lucilla Lame, MD;  Location: [facility] ENDOSCOPY;  Service: Endoscopy;  Laterality: N/A;  . CYSTOSCOPY  11/2014  . ESOPHAGOGASTRODUODENOSCOPY (EGD) WITH PROPOFOL N/A 12/27/2018   Procedure: ESOPHAGOGASTRODUODENOSCOPY (EGD) WITH  PROPOFOL;  Surgeon: Lucilla Lame, MD;  Location: [facility] ENDOSCOPY;  Service: Endoscopy;  Laterality: N/A;  . MASTECTOMY  1996   Left  . MELANOMA EXCISION  1995   Head    Home Medications:  Allergies as of 09/06/2019   No Known Allergies     Medication List       Accurate as of September 06, 2019  9:52 AM. If you have any questions, ask your nurse or doctor.        STOP taking these medications   Turmeric 400 MG Caps Stopped by: Festus Aloe, MD     TAKE these medications   baclofen 10 MG tablet Commonly known as: LIORESAL Take 1 tablet (10 mg total) by mouth 3 (three) times daily as needed for muscle spasms.   butalbital-acetaminophen-caffeine 50-325-40 MG tablet Commonly known as: FIORICET Take 1 tablet by mouth every 6 (six) hours as needed (as needed).   Calcium-Magnesium 500-250 MG Tabs Take 2 tablets by mouth 2 (two) times daily.   CINNAMON PO Take 1 capsule by mouth daily. 350 mg   CoQ10 30 MG Caps Take 1 capsule by mouth every other day.   [employer]S Cranberry 500 MG Caps Generic drug: Cranberry Take 1 tablet by mouth daily.   esomeprazole 20 MG capsule Commonly known as: NEXIUM Take 1-2 capsules (20-40 mg total) by mouth daily.   Essential One Daily Multivit Tabs Take by mouth.  EYE SUPPORT PO Take 1 capsule by mouth daily.   Leg Vein & Circulation Tabs Take 2 capsules by mouth daily.   nitrofurantoin 100 MG capsule Commonly known as: MACRODANTIN Take 100 mg by mouth as needed.   Omega-3 1000 MG Caps Take 1 capsule by mouth 2 (two) times a day.   Ultra Support Spinal Orthosis Misc Wear upper back (thoracic) support brace daily.       Allergies: No Known Allergies  Family History: Family History  Problem Relation Age of Onset  . Breast cancer Mother   . Migraines Mother   . Diabetes Father   . CAD Father   . Thrombosis Father   . Biliary Cirrhosis Brother   . Breast cancer Other        Niece    Social History:  reports that she  has never smoked. She has never used smokeless tobacco. She reports that she does not drink alcohol or use drugs.  ROS:                                        Physical Exam: BP 121/68   Pulse 93   Ht 5\' 3"  (1.6 m)   Wt 79.4 kg   BMI 31.00 kg/m   Constitutional:  Alert and oriented, No acute distress. HEENT: [place] AT, moist mucus membranes.  Trachea midline, no masses. Cardiovascular: No clubbing, cyanosis, or edema. Respiratory: Normal respiratory effort, no increased work of breathing. GI: Abdomen is soft, nontender, nondistended, no abdominal masses GU: No CVA tenderness Lymph: No cervical or inguinal lymphadenopathy. Skin: No rashes, bruises or suspicious lesions. Neurologic: Grossly intact, no focal deficits, moving all 4 extremities. Psychiatric: Normal mood and affect.  Laboratory Data: Lab Results  Component Value Date   WBC 6.8 08/18/2019   HGB 12.6 08/18/2019   HCT 39.7 08/18/2019   MCV 78.1 (L) 08/18/2019   PLT 243 08/18/2019    Lab Results  Component Value Date   CREATININE 0.85 08/18/2019    No results found for: PSA  No results found for: TESTOSTERONE  Lab Results  Component Value Date   HGBA1C 6.3 (H) 08/18/2019    Urinalysis    Component Value Date/Time   COLORURINE Yellow 02/23/2013 0938   APPEARANCEUR Clear 02/22/2019 1010   LABSPEC 1.012 02/23/2013 0938   PHURINE 6.0 02/23/2013 0938   GLUCOSEU Negative 02/22/2019 1010   GLUCOSEU Negative 02/23/2013 0938   HGBUR 1+ 02/23/2013 0938   BILIRUBINUR Negative 02/22/2019 1010   BILIRUBINUR Negative 02/23/2013 0938   KETONESUR Negative 02/23/2013 0938   PROTEINUR Trace (A) 02/22/2019 1010   PROTEINUR Negative 02/23/2013 0938   NITRITE Negative 02/22/2019 1010   NITRITE Negative 02/23/2013 0938   LEUKOCYTESUR Negative 02/22/2019 1010   LEUKOCYTESUR Negative 02/23/2013 0938    Lab Results  Component Value Date   LABMICR See below: 02/22/2019   WBCUA 0-5 02/22/2019    RBCUA None seen 02/22/2019   LABEPIT 0-10 02/22/2019   MUCUS Present (A) 02/22/2019   BACTERIA Few 02/22/2019    Pertinent Imaging: n/a No results found for this or any previous visit. No results found for this or any previous visit. No results found for this or any previous visit. No results found for this or any previous visit. No results found for this or any previous visit. No results found for this or any previous visit. No results found for this or  any previous visit. No results found for this or any previous visit.  Assessment & Plan:    cystitis - frequency, urgency - doing well on occasional NF and Azo. She will return in one year or call if needed.   No follow-ups on file.  Festus Aloe, MD  [facility] [address] [place], [place] [address] [phone]

## 2019-12-15 ENCOUNTER — Encounter: Payer: Self-pay | Admitting: Family Medicine

## 2019-12-15 ENCOUNTER — Other Ambulatory Visit: Payer: Self-pay

## 2019-12-15 ENCOUNTER — Ambulatory Visit (INDEPENDENT_AMBULATORY_CARE_PROVIDER_SITE_OTHER): Payer: Medicare Other | Admitting: Family Medicine

## 2019-12-15 DIAGNOSIS — Z780 Asymptomatic menopausal state: Secondary | ICD-10-CM

## 2019-12-15 DIAGNOSIS — M5412 Radiculopathy, cervical region: Secondary | ICD-10-CM

## 2019-12-15 DIAGNOSIS — K219 Gastro-esophageal reflux disease without esophagitis: Secondary | ICD-10-CM

## 2019-12-15 DIAGNOSIS — M8580 Other specified disorders of bone density and structure, unspecified site: Secondary | ICD-10-CM

## 2019-12-15 DIAGNOSIS — F320 Major depressive disorder, single episode, mild: Secondary | ICD-10-CM

## 2019-12-15 DIAGNOSIS — D509 Iron deficiency anemia, unspecified: Secondary | ICD-10-CM

## 2019-12-15 DIAGNOSIS — R7303 Prediabetes: Secondary | ICD-10-CM

## 2019-12-15 DIAGNOSIS — G44209 Tension-type headache, unspecified, not intractable: Secondary | ICD-10-CM

## 2019-12-15 DIAGNOSIS — F32A Depression, unspecified: Secondary | ICD-10-CM

## 2019-12-15 DIAGNOSIS — R739 Hyperglycemia, unspecified: Secondary | ICD-10-CM

## 2019-12-15 NOTE — Progress Notes (Signed)
Name: Christina Salas   MRN: [id number]    DOB: 1933/05/05   Date:12/15/2019       Progress Note  Subjective  Chief Complaint  Chief Complaint  Patient presents with  . Neck Pain    Pain was worse this morning, it is gradually improving now.  . Depression  . Headache    No severe headaches lately.    I connected with  Modena I Ardelean on 12/15/19 at  3:20 PM EST by telephone and verified that I am speaking with the correct person using two identifiers.  I discussed the limitations, risks, security and privacy concerns of performing an evaluation and management service by telephone and the availability of in person appointments. Staff also discussed with the patient that there may be a patient responsible charge related to this service. Patient Location: at home  Provider Location: [facility]   HPI  Chronic cystitis: she was seen by Dr. Junious Silk , she states she has been doing well, she takes Macrobid . She takes medication when she has urinary frequency.  She states last episode was about one month ago, takes it for a few times and symptoms resolves  Cervical Radiculopathy: she never started taking Lyrica, but states pain has improved with baclofen prn, still feels stiff at times but pain is not as intense, and occasionally tingling down to right shoulder.She will let us know when she decides to see Dr. Vertell Limber at [facility] She states she was doing well until last night and is back on baclofen. She takes it prn . She states she has a copper infused shirt and it seems to help. She feels like a neck brace would be helpful , explained it may make her neck muscles weaker. She states she is willing to try Lyrica now   Intermittent diarrhea: she states all her life she has episodes of bouts of diarrhea, sometimes it clears her gut, she states about one month ago she had loose stools a couple of times a day for a couple of weeks but resolved by itself. She also  had some nausea. She resumed Nexium every other day and nausea resolved  Tension headache: she is taking fioricet prn and needs a refill, she describes headaches as dull and or sharp, not as frequent now, she states baclofen helps. Usually frontal, very seldom she has associated  nausea or vomiting   Osteopenia: she refuses to go back on Alendronate, she is worried about side effects. Discussed high calcium diet and vitamin D 2000 units daily   Mild Depression: she has long history of depression, she states not feeling great right now, states life is boring, denies suicidal thoughts or ideation, but has lack of energy, change in appetite and phq 9 has improved since last visit, she does not like taking medications for that   Pre-diabetes: she denies polyphagia or polyuria, she feels her mouth gets dry at times  GERD: controlled with Nexium at this time  Patient Active Problem List   Diagnosis Date Noted  . Mild depression ([place]) 01/24/2019  . Large hiatal hernia 01/24/2019  . Internal hemorrhoids 01/24/2019  . Iron deficiency anemia due to chronic blood loss   . Venous reflux 04/19/2018  . Swelling of limb 04/27/2017  . Prediabetes 06/08/2016  . Obesity (BMI 30.0-34.9) 06/08/2016  . Vitamin D deficiency 06/08/2016  . Post-menopausal 12/11/2015  . DD (diverticular disease) 06/19/2015  . Edema extremities 06/19/2015  . Esophageal reflux 06/19/2015  . HLD (hyperlipidemia)  06/19/2015  . Varicose veins of leg with swelling, bilateral 06/19/2015  . Recurrent UTI 02/08/2013  . Mixed incontinence 02/08/2013  . History of breast cancer, left, T1, N0, Left MRM - Dec 1996 03/18/2012  . Tension headache 05/24/2007  . History of melanoma 08/28/1994    Past Surgical History:  Procedure Laterality Date  . ABDOMINAL HYSTERECTOMY  2001  . BREAST BIOPSY  2010  . BREAST EXCISIONAL BIOPSY Right 1997   Benign excisional biopsy   . COLONOSCOPY  04/02/2014  . COLONOSCOPY WITH PROPOFOL N/A  12/27/2018   Procedure: COLONOSCOPY WITH PROPOFOL;  Surgeon: Lucilla Lame, MD;  Location: [facility] ENDOSCOPY;  Service: Endoscopy;  Laterality: N/A;  . CYSTOSCOPY  11/2014  . ESOPHAGOGASTRODUODENOSCOPY (EGD) WITH PROPOFOL N/A 12/27/2018   Procedure: ESOPHAGOGASTRODUODENOSCOPY (EGD) WITH PROPOFOL;  Surgeon: Lucilla Lame, MD;  Location: [facility] ENDOSCOPY;  Service: Endoscopy;  Laterality: N/A;  . MASTECTOMY  1996   Left  . MELANOMA EXCISION  1995   Head    Family History  Problem Relation Age of Onset  . Breast cancer Mother   . Migraines Mother   . Diabetes Father   . CAD Father   . Thrombosis Father   . Biliary Cirrhosis Brother   . Breast cancer Other        Niece    Social History   Socioeconomic History  . Marital status: Single    Spouse name: Not on file  . Number of children: 0  . Years of education: Not on file  . Highest education level: Master's degree (e.g., MA, MS, MEng, MEd, MSW, MBA)  Occupational History  . Occupation: Retired  Tobacco Use  . Smoking status: Never Smoker  . Smokeless tobacco: Never Used  Substance and Sexual Activity  . Alcohol use: No    Alcohol/week: 0.0 standard drinks  . Drug use: Never  . Sexual activity: Not Currently  Other Topics Concern  . 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 Difficulty of Paying Living Expenses: Not hard at all  Food Insecurity: No Food Insecurity  . Worried About Charity fundraiser in the Last Year: Never true  . Ran Out of Food in the Last Year: Never true  Transportation Needs: No Transportation Needs  . Lack of Transportation (Medical): No  . Lack of Transportation (Non-Medical): No  Physical Activity: Inactive  . Days of Exercise per Week: 0 days  . Minutes of Exercise per Session: 0 min  Stress: No Stress  Concern Present  . Feeling of Stress : Only a little  Social Connections: Somewhat Isolated  . Frequency of Communication with Friends and Family: More than three times a week  . Frequency of Social Gatherings with Friends and Family: Three times a week  . Attends Religious Services: More than 4 times per year  . Active Member of Clubs or Organizations: No  . Attends Archivist Meetings: Never  . Marital Status: Never married  Intimate Partner Violence: Not At Risk  . Fear of Current or Ex-Partner: No  . Emotionally Abused: No  . Physically Abused: No  . Sexually Abused: No     Current Outpatient Medications:  .  baclofen (LIORESAL) 10 MG tablet, Take 1 tablet (10 mg total)  by mouth 3 (three) times daily as needed for muscle spasms., Disp: 30 each, Rfl: 0 .  butalbital-acetaminophen-caffeine (FIORICET) 50-325-40 MG tablet, Take 1 tablet by mouth every 6 (six) hours as needed (as needed)., Disp: 20 tablet, Rfl: 0 .  Calcium Carbonate-Vitamin D (OYSTER SHELL CALCIUM/D) 250-125 MG-UNIT TABS, Take by mouth., Disp: , Rfl:  .  Calcium-Magnesium 500-250 MG TABS, Take 2 tablets by mouth 2 (two) times daily., Disp: , Rfl:  .  CINNAMON PO, Take 1 capsule by mouth daily. 350 mg, Disp: , Rfl:  .  Coenzyme Q10 (COQ10) 30 MG CAPS, Take 1 capsule by mouth every other day., Disp: , Rfl:  .  Cranberry (CVS CRANBERRY) 500 MG CAPS, Take 1 tablet by mouth daily., Disp: , Rfl:  .  Elastic Bandages & Supports (ULTRA SUPPORT SPINAL ORTHOSIS) MISC, Wear upper back (thoracic) support brace daily., Disp: , Rfl:  .  esomeprazole (NEXIUM) 20 MG capsule, Take 1-2 capsules (20-40 mg total) by mouth daily., Disp: 100 capsule, Rfl: 1 .  Misc Natural Products (LEG VEIN & CIRCULATION) TABS, Take 2 capsules by mouth daily., Disp: , Rfl:  .  Multiple Vitamins-Calcium (ESSENTIAL ONE DAILY MULTIVIT) TABS, Take by mouth., Disp: , Rfl:  .  Multiple Vitamins-Minerals (EYE SUPPORT PO), Take 1 capsule by mouth daily.,  Disp: , Rfl:  .  nitrofurantoin (MACRODANTIN) 100 MG capsule, Take 1 capsule (100 mg total) by mouth as needed. Take 1 capsule by mouth daily as needed, Disp: 90 capsule, Rfl: 0 .  Omega-3 1000 MG CAPS, Take 1 capsule by mouth 2 (two) times a day. , Disp: , Rfl:  .  Omega-3 Fatty Acids (FISH OIL) 1000 MG CAPS, Take by mouth., Disp: , Rfl:   No Known Allergies  I personally reviewed active problem list, medication list, allergies, family history, social history, health maintenance with the patient/caregiver today.   ROS  Ten systems reviewed and is negative except as mentioned in HPI   Objective  Virtual encounter, vitals not obtained.  There is no height or weight on file to calculate BMI.  Physical Exam  Awake, alert and oriented  PHQ2/9: Depression screen [facility] 2/9 12/15/2019 08/18/2019 07/26/2019 04/25/2019 01/24/2019  Decreased Interest 1 1 1 1 1   Down, Depressed, Hopeless 1 1 1  0 0  PHQ - 2 Score 2 2 2 1 1   Altered sleeping 0 1 1 2 1   Tired, decreased energy 1 2 1 1 1   Change in appetite 0 1 1 0 1  Feeling bad or failure about yourself  0 1 0 1 1  Trouble concentrating 1 0 0 0 0  Moving slowly or fidgety/restless 0 2 0 0 1  Suicidal thoughts 0 - 0 0 0  PHQ-9 Score 4 9 5 5 6   Difficult doing work/chores - Somewhat difficult Somewhat difficult Somewhat difficult Somewhat difficult   PHQ-2/9 Result is positive.    Fall Risk: Fall Risk  12/15/2019 08/18/2019 07/26/2019 04/25/2019 01/24/2019  Falls in the past year? 0 0 0 0 0  Number falls in past yr: 0 0 0 0 -  Injury with Fall? 0 0 0 0 -     Assessment & Plan  1. Tension headache  She is taking Excedrin daily discussed risk of bleeding and kidney dysfunction, she will try eliminate nsaid's  2. Cervical radiculopathy  She will try Lyrica  3. Mild depression (HCC)  Stable  4. Osteopenia after menopause  Discussed high calcium diet and vitamin D otc, she does  not want alendronate  5. Gastroesophageal reflux  disease without esophagitis  Taking Nexium every day now  6. Hyperglycemia   7. Prediabetes  Discussed healthier diet   8. Iron deficiency anemia, unspecified iron deficiency anemia type  She has seen Dr. Durwin Reges and was advised to have capsule endoscopy   I discussed the assessment and treatment plan with the patient. The patient was provided an opportunity to ask questions and all were answered. The patient agreed with the plan and demonstrated an understanding of the instructions.   The patient was advised to call back or seek an in-person evaluation if the symptoms worsen or if the condition fails to improve as anticipated.  I provided 25  minutes of non-face-to-face time during this encounter.  Loistine Chance, MD

## 2020-01-30 ENCOUNTER — Ambulatory Visit (INDEPENDENT_AMBULATORY_CARE_PROVIDER_SITE_OTHER): Payer: Medicare PPO

## 2020-01-30 DIAGNOSIS — Z0000 Encounter for general adult medical examination without abnormal findings: Secondary | ICD-10-CM | POA: Diagnosis not present

## 2020-01-30 NOTE — Patient Instructions (Signed)
Christina Salas , Thank you for taking time to come for your Medicare Wellness Visit. I appreciate your ongoing commitment to your health goals. Please review the following plan we discussed and let me know if I can assist you in the future.   Screening recommendations/referrals: Colonoscopy: no longer required Mammogram: done 06/23/19. Please call [phone] to schedule your mammogram.  Bone Density: done 06/23/19 Recommended yearly ophthalmology/optometry visit for glaucoma screening and checkup Recommended yearly dental visit for hygiene and checkup  Vaccinations: Influenza vaccine: done 08/18/19 Pneumococcal vaccine: done 12/03/14 Tdap vaccine: done 12/03/10 Shingles vaccine: Shingrix discussed. Please contact your pharmacy for coverage information.    Advanced directives: Advance directive discussed with you today. I have provided a copy for you to complete at home and have notarized. Once this is complete please bring a copy in to our office so we can scan it into your chart.  Conditions/risks identified: Recommend increasing physical activity to 3 days per week  Next appointment: Please follow up in one year for your Medicare Annual Wellness visit.     Preventive Care 84 Years and Older, Female Preventive care refers to lifestyle choices and visits with your health care provider that can promote health and wellness. What does preventive care include?  A yearly physical exam. This is also called an annual well check.  Dental exams once or twice a year.  Routine eye exams. Ask your health care provider how often you should have your eyes checked.  Personal lifestyle choices, including:  Daily care of your teeth and gums.  Regular physical activity.  Eating a healthy diet.  Avoiding tobacco and drug use.  Limiting alcohol use.  Practicing safe sex.  Taking low-dose aspirin every day.  Taking vitamin and mineral supplements as recommended by your health care  provider. What happens during an annual well check? The services and screenings done by your health care provider during your annual well check will depend on your age, overall health, lifestyle risk factors, and family history of disease. Counseling  Your health care provider may ask you questions about your:  Alcohol use.  Tobacco use.  Drug use.  Emotional well-being.  Home and relationship well-being.  Sexual activity.  Eating habits.  History of falls.  Memory and ability to understand (cognition).  Work and work Statistician.  Reproductive health. Screening  You may have the following tests or measurements:  Height, weight, and BMI.  Blood pressure.  Lipid and cholesterol levels. These may be checked every 5 years, or more frequently if you are over 84 years old.  Skin check.  Lung cancer screening. You may have this screening every year starting at age 84 if you have a 30-pack-year history of smoking and currently smoke or have quit within the past 15 years.  Fecal occult blood test (FOBT) of the stool. You may have this test every year starting at age 84.  Flexible sigmoidoscopy or colonoscopy. You may have a sigmoidoscopy every 5 years or a colonoscopy every 10 years starting at age 84.  Hepatitis C blood test.  Hepatitis B blood test.  Sexually transmitted disease (STD) testing.  Diabetes screening. This is done by checking your blood sugar (glucose) after you have not eaten for a while (fasting). You may have this done every 1-3 years.  Bone density scan. This is done to screen for osteoporosis. You may have this done starting at age 84.  Mammogram. This may be done every 1-2 years. Talk to your health care provider  about how often you should have regular mammograms. Talk with your health care provider about your test results, treatment options, and if necessary, the need for more tests. Vaccines  Your health care provider may recommend certain  vaccines, such as:  Influenza vaccine. This is recommended every year.  Tetanus, diphtheria, and acellular pertussis (Tdap, Td) vaccine. You may need a Td booster every 10 years.  Zoster vaccine. You may need this after age 84.  Pneumococcal 13-valent conjugate (PCV13) vaccine. One dose is recommended after age 84.  Pneumococcal polysaccharide (PPSV23) vaccine. One dose is recommended after age 84. Talk to your health care provider about which screenings and vaccines you need and how often you need them. This information is not intended to replace advice given to you by your health care provider. Make sure you discuss any questions you have with your health care provider. Document Released: 01/10/2016 Document Revised: 09/02/2016 Document Reviewed: 10/15/2015 Elsevier Interactive Patient Education  2017 [employer] Prevention in the Home Falls can cause injuries. They can happen to people of all ages. There are many things you can do to make your home safe and to help prevent falls. What can I do on the outside of my home?  Regularly fix the edges of walkways and driveways and fix any cracks.  Remove anything that might make you trip as you walk through a door, such as a raised step or threshold.  Trim any bushes or trees on the path to your home.  Use bright outdoor lighting.  Clear any walking paths of anything that might make someone trip, such as rocks or tools.  Regularly check to see if handrails are loose or broken. Make sure that both sides of any steps have handrails.  Any raised decks and porches should have guardrails on the edges.  Have any leaves, snow, or ice cleared regularly.  Use sand or salt on walking paths during winter.  Clean up any spills in your garage right away. This includes oil or grease spills. What can I do in the bathroom?  Use night lights.  Install grab bars by the toilet and in the tub and shower. Do not use towel bars as grab  bars.  Use non-skid mats or decals in the tub or shower.  If you need to sit down in the shower, use a plastic, non-slip stool.  Keep the floor dry. Clean up any water that spills on the floor as soon as it happens.  Remove soap buildup in the tub or shower regularly.  Attach bath mats securely with double-sided non-slip rug tape.  Do not have throw rugs and other things on the floor that can make you trip. What can I do in the bedroom?  Use night lights.  Make sure that you have a light by your bed that is easy to reach.  Do not use any sheets or blankets that are too big for your bed. They should not hang down onto the floor.  Have a firm chair that has side arms. You can use this for support while you get dressed.  Do not have throw rugs and other things on the floor that can make you trip. What can I do in the kitchen?  Clean up any spills right away.  Avoid walking on wet floors.  Keep items that you use a lot in easy-to-reach places.  If you need to reach something above you, use a strong step stool that has a grab bar.  Keep electrical cords out of the way.  Do not use floor polish or wax that makes floors slippery. If you must use wax, use non-skid floor wax.  Do not have throw rugs and other things on the floor that can make you trip. What can I do with my stairs?  Do not leave any items on the stairs.  Make sure that there are handrails on both sides of the stairs and use them. Fix handrails that are broken or loose. Make sure that handrails are as long as the stairways.  Check any carpeting to make sure that it is firmly attached to the stairs. Fix any carpet that is loose or worn.  Avoid having throw rugs at the top or bottom of the stairs. If you do have throw rugs, attach them to the floor with carpet tape.  Make sure that you have a light switch at the top of the stairs and the bottom of the stairs. If you do not have them, ask someone to add them for  you. What else can I do to help prevent falls?  Wear shoes that:  Do not have high heels.  Have rubber bottoms.  Are comfortable and fit you well.  Are closed at the toe. Do not wear sandals.  If you use a stepladder:  Make sure that it is fully opened. Do not climb a closed stepladder.  Make sure that both sides of the stepladder are locked into place.  Ask someone to hold it for you, if possible.  Clearly mark and make sure that you can see:  Any grab bars or handrails.  First and last steps.  Where the edge of each step is.  Use tools that help you move around (mobility aids) if they are needed. These include:  Canes.  Walkers.  Scooters.  Crutches.  Turn on the lights when you go into a dark area. Replace any light bulbs as soon as they burn out.  Set up your furniture so you have a clear path. Avoid moving your furniture around.  If any of your floors are uneven, fix them.  If there are any pets around you, be aware of where they are.  Review your medicines with your doctor. Some medicines can make you feel dizzy. This can increase your chance of falling. Ask your doctor what other things that you can do to help prevent falls. This information is not intended to replace advice given to you by your health care provider. Make sure you discuss any questions you have with your health care provider. Document Released: 10/10/2009 Document Revised: 05/21/2016 Document Reviewed: 01/18/2015 Elsevier Interactive Patient Education  2017 [employer].

## 2020-01-30 NOTE — Progress Notes (Signed)
Subjective:   Christina Salas is a 86 y.o. female who presents for Medicare Annual (Subsequent) preventive examination.  Virtual Visit via Telephone Note  I connected with [name] on 01/30/20 at  2:50 PM EST by telephone and verified that I am speaking with the correct person using two identifiers.  Medicare Annual Wellness visit completed telephonically due to Covid-19 pandemic.   Location: Patient: home Provider: office   I discussed the limitations, risks, security and privacy concerns of performing an evaluation and management service by telephone and the availability of in person appointments. The patient expressed understanding and agreed to proceed.  Some vital signs may be absent or patient reported.   [name], LPN    Review of Systems:   Cardiac Risk Factors include: advanced age (>69men, >98 women);dyslipidemia;obesity (BMI >30kg/m2)     Objective:     Vitals: There were no vitals taken for this visit.  There is no height or weight on file to calculate BMI.  Advanced Directives 01/30/2020 01/24/2019 12/27/2018 07/13/2017 04/14/2017 01/21/2017 12/16/2016  Does Patient Have a Medical Advance Directive? No No No No Yes No Yes  Type of Advance Directive - - - - Living will - [place] in Chart? - - - - - - No - copy requested  Would patient like information on creating a medical advance directive? Yes (MAU/Ambulatory/Procedural Areas - Information given) Yes (MAU/Ambulatory/Procedural Areas - Information given) - - - - -    Tobacco Social History   Tobacco Use  Smoking Status Never Smoker  Smokeless Tobacco Never Used     Counseling given: Not Answered   Clinical Intake:  Pre-visit preparation completed: Yes  Pain : No/denies pain     Nutritional Risks: None Diabetes: No  How often do you need to have someone help you when you read instructions, pamphlets, or other written  materials from your doctor or pharmacy?: 1 - Never  Interpreter Needed?: No  Information entered by :: [name] LPN  Past Medical History:  Diagnosis Date  . Abnormal glucose   . Anxiety   . Arthritis   . Breast cancer ([place])   . Cancer ([place])    melanoma  . Chronic headaches   . Depression   . GERD (gastroesophageal reflux disease)   . H/O melanoma excision   . History of diverticulitis   . History of recurrent UTIs   . Osteoporosis    pre-osetoporosis per patient history form 03/18/12  . Pre-diabetes   . Vitamin D deficiency    Past Surgical History:  Procedure Laterality Date  . ABDOMINAL HYSTERECTOMY  2001  . BREAST BIOPSY  2010  . BREAST EXCISIONAL BIOPSY Right 1997   Benign excisional biopsy   . COLONOSCOPY  04/02/2014  . COLONOSCOPY WITH PROPOFOL N/A 12/27/2018   Procedure: COLONOSCOPY WITH PROPOFOL;  Surgeon: Lucilla Lame, MD;  Location: [facility] ENDOSCOPY;  Service: Endoscopy;  Laterality: N/A;  . CYSTOSCOPY  11/2014  . ESOPHAGOGASTRODUODENOSCOPY (EGD) WITH PROPOFOL N/A 12/27/2018   Procedure: ESOPHAGOGASTRODUODENOSCOPY (EGD) WITH PROPOFOL;  Surgeon: Lucilla Lame, MD;  Location: [facility] ENDOSCOPY;  Service: Endoscopy;  Laterality: N/A;  . MASTECTOMY  1996   Left  . MELANOMA EXCISION  1995   Head   Family History  Problem Relation Age of Onset  . Breast cancer Mother   . Migraines Mother   . Diabetes Father   . CAD Father   . Thrombosis Father   .  Biliary Cirrhosis Brother   . Breast cancer Other        Niece   Social History   Socioeconomic History  . Marital status: Single    Spouse name: Not on file  . Number of children: 0  . Years of education: Not on file  . Highest education level: Master's degree (e.g., MA, MS, MEng, MEd, MSW, MBA)  Occupational History  . Occupation: Retired  Tobacco Use  . Smoking status: Never Smoker  . Smokeless tobacco: Never Used  Substance and Sexual Activity  . Alcohol use: No    Alcohol/week: 0.0 standard drinks    . Drug use: Never  . Sexual activity: Not Currently  Other Topics Concern  . 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 Difficulty of Paying Living Expenses: Not hard at all  Food Insecurity: No Food Insecurity  . Worried About Charity fundraiser in the Last Year: Never true  . Ran Out of Food in the Last Year: Never true  Transportation Needs: No Transportation Needs  . Lack of Transportation (Medical): No  . Lack of Transportation (Non-Medical): No  Physical Activity:   . Days of Exercise per Week: Not on file  . Minutes of Exercise per Session: Not on file  Stress: No Stress Concern Present  . Feeling of Stress : Only a little  Social Connections: Somewhat Isolated  . Frequency of Communication with Friends and Family: More than three times a week  . Frequency of Social Gatherings with Friends and Family: Three times a week  . Attends Religious Services: More than 4 times per year  . Active Member of Clubs or Organizations: No  . Attends Archivist Meetings: Never  . Marital Status: Never married    Outpatient Encounter Medications as of 01/30/2020  Medication Sig  . baclofen (LIORESAL) 10 MG tablet Take 1 tablet (10 mg total) by mouth 3 (three) times daily as needed for muscle spasms.  . butalbital-acetaminophen-caffeine (FIORICET) 50-325-40 MG tablet Take 1 tablet by mouth every 6 (six) hours as needed (as needed).  . Calcium-Magnesium 500-250 MG TABS Take 1 tablet by mouth 2 (two) times daily.  [name] CINNAMON PO Take 1 capsule by mouth daily. 350 mg  . Cranberry ([employer] CRANBERRY) 500 MG CAPS Take 1 tablet by mouth daily.  Water engineer Bandages & Supports (ULTRA SUPPORT SPINAL ORTHOSIS) MISC Wear upper back (thoracic) support brace daily.  [name] esomeprazole (NEXIUM) 20 MG capsule Take  1-2 capsules (20-40 mg total) by mouth daily.  . Misc Natural Products (LEG VEIN & CIRCULATION) TABS Take 2 capsules by mouth daily.  . Multiple Vitamins-Calcium (ESSENTIAL ONE DAILY MULTIVIT) TABS Take by mouth.  . Multiple Vitamins-Minerals (EYE SUPPORT PO) Take 1 capsule by mouth daily.  . nitrofurantoin (MACRODANTIN) 100 MG capsule Take 1 capsule (100 mg total) by mouth as needed. Take 1 capsule by mouth daily as needed  . Omega-3 Fatty Acids (FISH OIL) 1000 MG CAPS Take by mouth.  . [DISCONTINUED] Calcium Carbonate-Vitamin D (OYSTER SHELL CALCIUM/D) 250-125 MG-UNIT TABS Take by mouth.  . [DISCONTINUED] Coenzyme Q10 (COQ10) 30 MG CAPS Take 1 capsule by mouth every other day.   No facility-administered encounter medications on file as of 01/30/2020.  Activities of Daily Living In your present state of health, do you have any difficulty performing the following activities: 01/30/2020 12/15/2019  Hearing? Y Y  Comment - -  Vision? N N  Comment - -  Difficulty concentrating or making decisions? N N  Walking or climbing stairs? Y Y  Dressing or bathing? N N  Doing errands, shopping? N N  Preparing Food and eating ? N -  Using the Toilet? N -  In the past six months, have you accidently leaked urine? Y -  Comment wears pads for protection -  Do you have problems with loss of bowel control? N -  Managing your Medications? N -  Managing your Finances? N -  Housekeeping or managing your Housekeeping? N -  Some recent data might be hidden    Patient Care Team: Steele Sizer, MD as PCP - General (Family Medicine) Murrell Redden, MD as Consulting Physician (Urology) Dew, Erskine Squibb, MD as Consulting Physician (Vascular Surgery) Yolonda Kida, MD as Consulting Physician (Cardiology) Troxler, Adele Schilder as Attending Physician (Podiatry) Luberta Mutter, MD as Consulting Physician (Ophthalmology) Ralene Bathe, MD as Consulting Physician (Dermatology)    Assessment:   This is  a routine wellness examination for [place].  Exercise Activities and Dietary recommendations Current Exercise Habits: The patient does not participate in regular exercise at present, Exercise limited by: None identified  Goals    . Patient Stated     Pt has goals for herself to complete projects at home and organize pictures and family history information.        Fall Risk Fall Risk  01/30/2020 12/15/2019 08/18/2019 07/26/2019 04/25/2019  Falls in the past year? 0 0 0 0 0  Number falls in past yr: 0 0 0 0 0  Injury with Fall? 0 0 0 0 0  Risk for fall due to : No Fall Risks - - - -  Follow up Falls prevention discussed - - - -   FALL RISK PREVENTION PERTAINING TO THE HOME:  Any stairs in or around the home? Yes  If so, do they handrails? Yes   Home free of loose throw rugs in walkways, pet beds, electrical cords, etc? Yes  Adequate lighting in your home to reduce risk of falls? Yes   ASSISTIVE DEVICES UTILIZED TO PREVENT FALLS:  Life alert? No  Use of a cane, [place] or w/c? No  Grab bars in the bathroom? Yes  Shower chair or bench in shower? Yes but doesn't use Elevated toilet seat or a handicapped toilet? Yes   DME ORDERS:  DME order needed?  No   TIMED UP AND GO:  Was the test performed? No . Telephonic visit.   Education: Fall risk prevention has been discussed.  Intervention(s) required? No    Depression Screen PHQ 2/9 Scores 01/30/2020 12/15/2019 08/18/2019 07/26/2019  PHQ - 2 Score 0 2 2 2   PHQ- 9 Score - 4 9 5   Exception Documentation - - - -  Not completed - - - -     Cognitive Function     6CIT Screen 01/30/2020 01/24/2019  What Year? 0 points 0 points  What month? 0 points 0 points  What time? 0 points 0 points  Count back from 20 0 points 0 points  Months in reverse 0 points 0 points  Repeat phrase 4 points 2 points  Total Score 4 2    Immunization History  Administered Date(s) Administered  . Fluad Quad(high Dose 65+) 08/18/2019  .  Influenza  Split 09/10/2007, 11/13/2008, 09/27/2009, 10/17/2010  . Influenza, High Dose Seasonal PF 10/21/2015, 09/28/2016, 10/25/2017, 10/19/2018  . Influenza, Seasonal, Injecte, Preservative Fre 10/30/2011, 10/21/2012  . Influenza,inj,Quad PF,6+ Mos 09/06/2013, 10/19/2014  . PFIZER SARS-COV-2 Vaccination 01/23/2020  . Pneumococcal Conjugate-13 12/03/2014  . Pneumococcal Polysaccharide-23 06/15/2012  . Tdap 12/03/2010    Qualifies for Shingles Vaccine? Yes . Due for Shingrix. Education has been provided regarding the importance of this vaccine. Pt has been advised to call insurance company to determine out of pocket expense. Advised may also receive vaccine at local pharmacy or Health Dept. Verbalized acceptance and understanding.  Tdap: Up to date  Flu Vaccine: Up to date  Pneumococcal Vaccine: Up to date   Screening Tests Health Maintenance  Topic Date Due  . TETANUS/TDAP  12/03/2020  . INFLUENZA VACCINE  Completed  . DEXA SCAN  Completed  . PNA vac Low Risk Adult  Completed    Cancer Screenings:  Colorectal Screening: Completed 12/27/18.  No longer required.  Mammogram: Completed 06/23/19. Repeat every year. Ordered today. Pt provided with contact information to call and schedule appt.   Bone Density: Completed 06/23/19. Results reflect OSTEOPENIA. Repeat every 2 years.  Lung Cancer Screening: (Low Dose CT Chest recommended if Age 10-80 years, 30 pack-year currently smoking OR have quit w/in 15years.) does not qualify.   Additional Screening:  Hepatitis C Screening: no longer required   Vision Screening: Recommended annual ophthalmology exams for early detection of glaucoma and other disorders of the eye. Is the patient up to date with their annual eye exam?  Yes  Who is the provider or what is the name of the office in which the pt attends annual eye exams? Dr. Ellie Lunch  Dental Screening: Recommended annual dental exams for proper oral hygiene  Community Resource  Referral:  CRR required this visit?  No      Plan:     I have personally reviewed and addressed the Medicare Annual Wellness questionnaire and have noted the following in the patient's chart:  A. Medical and social history B. Use of alcohol, tobacco or illicit drugs  C. Current medications and supplements D. Functional ability and status E.  Nutritional status F.  Physical activity G. Advance directives H. List of other physicians I.  Hospitalizations, surgeries, and ER visits in previous 12 months J.  [place] such as hearing and vision if needed, cognitive and depression L. Referrals and appointments   In addition, I have reviewed and discussed with patient certain preventive protocols, quality metrics, and best practice recommendations. A written personalized care plan for preventive services as well as general preventive health recommendations were provided to patient.   Signed,  [name], LPN Nurse Health Advisor   Nurse Notes: none

## 2020-02-05 DIAGNOSIS — M79675 Pain in left toe(s): Secondary | ICD-10-CM | POA: Diagnosis not present

## 2020-02-05 DIAGNOSIS — M79674 Pain in right toe(s): Secondary | ICD-10-CM | POA: Diagnosis not present

## 2020-02-05 DIAGNOSIS — B351 Tinea unguium: Secondary | ICD-10-CM | POA: Diagnosis not present

## 2020-02-05 DIAGNOSIS — I878 Other specified disorders of veins: Secondary | ICD-10-CM | POA: Diagnosis not present

## 2020-02-05 DIAGNOSIS — L851 Acquired keratosis [keratoderma] palmaris et plantaris: Secondary | ICD-10-CM | POA: Diagnosis not present

## 2020-03-06 ENCOUNTER — Other Ambulatory Visit: Payer: Self-pay | Admitting: Family Medicine

## 2020-03-06 DIAGNOSIS — K219 Gastro-esophageal reflux disease without esophagitis: Secondary | ICD-10-CM

## 2020-03-06 MED ORDER — ESOMEPRAZOLE MAGNESIUM 20 MG PO CPDR: 20.0000 mg | DELAYED_RELEASE_CAPSULE | Freq: Every day | ORAL | 1 refills | Status: DC

## 2020-03-06 NOTE — Telephone Encounter (Signed)
Medication Refill - Medication: esomeprazole (NEXIUM) 20 MG capsule  Pt stated she needs a new rx for esomeprazole that reads 20MG  and take 1 per day due to her new insurance it has to be written this way.  90 day supply  Has the patient contacted their pharmacy? Yes.   (Agent: If no, request that the patient contact the pharmacy for the refill.) (Agent: If yes, when and what did the pharmacy advise?)  Preferred Pharmacy (with phone number or street name):  [id number] - [place], [place] - [place] Phone:  [phone]  Fax:  [phone]       Agent: Please be advised that RX refills may take up to 3 business days. We ask that you follow-up with your pharmacy.

## 2020-04-15 ENCOUNTER — Ambulatory Visit (INDEPENDENT_AMBULATORY_CARE_PROVIDER_SITE_OTHER): Payer: Medicare PPO | Admitting: Family Medicine

## 2020-04-15 ENCOUNTER — Encounter: Payer: Self-pay | Admitting: Family Medicine

## 2020-04-15 DIAGNOSIS — F32A Depression, unspecified: Secondary | ICD-10-CM

## 2020-04-15 DIAGNOSIS — K219 Gastro-esophageal reflux disease without esophagitis: Secondary | ICD-10-CM

## 2020-04-15 DIAGNOSIS — N390 Urinary tract infection, site not specified: Secondary | ICD-10-CM

## 2020-04-15 DIAGNOSIS — E559 Vitamin D deficiency, unspecified: Secondary | ICD-10-CM

## 2020-04-15 DIAGNOSIS — E781 Pure hyperglyceridemia: Secondary | ICD-10-CM | POA: Diagnosis not present

## 2020-04-15 DIAGNOSIS — F320 Major depressive disorder, single episode, mild: Secondary | ICD-10-CM

## 2020-04-15 DIAGNOSIS — I208 Other forms of angina pectoris: Secondary | ICD-10-CM | POA: Diagnosis not present

## 2020-04-15 DIAGNOSIS — R7303 Prediabetes: Secondary | ICD-10-CM

## 2020-04-15 DIAGNOSIS — G44209 Tension-type headache, unspecified, not intractable: Secondary | ICD-10-CM | POA: Diagnosis not present

## 2020-04-15 DIAGNOSIS — M5412 Radiculopathy, cervical region: Secondary | ICD-10-CM

## 2020-04-15 DIAGNOSIS — R739 Hyperglycemia, unspecified: Secondary | ICD-10-CM

## 2020-04-15 DIAGNOSIS — M62838 Other muscle spasm: Secondary | ICD-10-CM

## 2020-04-15 DIAGNOSIS — Z1231 Encounter for screening mammogram for malignant neoplasm of breast: Secondary | ICD-10-CM

## 2020-04-15 MED ORDER — ESOMEPRAZOLE MAGNESIUM 20 MG PO CPDR: 20.0000 mg | DELAYED_RELEASE_CAPSULE | Freq: Every day | ORAL | 1 refills | Status: DC

## 2020-04-15 NOTE — Progress Notes (Signed)
Name: Christina Salas   MRN: [id number]    DOB: January 03, 1933   Date:04/15/2020       Progress Note  Subjective  Chief Complaint  Chief Complaint  Patient presents with  . Depression  . Gastroesophageal Reflux  . Hyperglycemia  . Dyslipidemia    I connected with  Davinia I Saenz on 04/15/20 at  2:00 PM EDT by telephone and verified that I am speaking with the correct person using two identifiers.  I discussed the limitations, risks, security and privacy concerns of performing an evaluation and management service by telephone and the availability of in person appointments. Staff also discussed with the patient that there may be a patient responsible charge related to this service. Patient Location: at home  Provider Location: [facility]   HPI  Chronic cystitis: she was seen by Dr. Junious Silk , she states she has been doing well, she takes Macrobid . She takes medication when she has urinary frequency.  She has not taken medications in a while.   Cervical Radiculopathy: she never started taking Lyrica, but states pain has improved with baclofen prn, still feels stiff at times but pain is not as intense, and occasionally tingling down to right shoulder.She will let us know when she decides to see Dr. Vertell Limber at [facility] She states she was doing well until last night and is back on baclofen. She takes it prn . She states she has a copper infused shirt and it seems to help a little. She feels like a neck brace would be helpful , explained it may make her neck muscles weaker. She told me last time she was going to start medication, but is worried about side effects.   Tension headache: she is taking fioricet prn and needs a refill, she describes headaches as dull and or sharp, not as frequent now, she states baclofen helps. Usually frontal, very seldom she has associated  nausea or vomitingShe still has Fioricet at home and does not need refills today   Iron deficiency: last HCT  improved but ferritin still down to 9, discussed results again, She was seen by Dr. Durwin Reges back in 11/2018, had EGD and colonoscopy but did not have capsule endoscopy. She is not interested in going back at this time   Osteopenia: she refuses to go back on Alendronate, she is worried about side effects. Discussed high calcium diet and vitamin D 2000 units daily . Unchanged   Mild Depression: she has long history of depression, she states not feeling great right now, states life is boring, denies suicidal thoughts or ideation, but has lack of energy, change in appetite and phq 9 is 5 today.   Pre-diabetes: she denies polyphagia or polyuria, she feels her mouth gets dry at times. Last A1C was 6.3%   GERD: controlled with Nexium at this time, she is aware of long term side effects. She asked me to change rx to 90 pills so it is approved by insurance, she was getting 100 of them to take a second dose at times, but she will try taking Tums instead   Patient Active Problem List   Diagnosis Date Noted  . Mild depression ([place]) 01/24/2019  . Large hiatal hernia 01/24/2019  . Internal hemorrhoids 01/24/2019  . Iron deficiency anemia due to chronic blood loss   . Venous reflux 04/19/2018  . Swelling of limb 04/27/2017  . Prediabetes 06/08/2016  . Obesity (BMI 30.0-34.9) 06/08/2016  . Vitamin D deficiency 06/08/2016  . Post-menopausal 12/11/2015  .  DD (diverticular disease) 06/19/2015  . Edema extremities 06/19/2015  . Esophageal reflux 06/19/2015  . HLD (hyperlipidemia) 06/19/2015  . Varicose veins of leg with swelling, bilateral 06/19/2015  . Recurrent UTI 02/08/2013  . Mixed incontinence 02/08/2013  . History of breast cancer, left, T1, N0, Left MRM - Dec 1996 03/18/2012  . Tension headache 05/24/2007  . History of melanoma 08/28/1994    Past Surgical History:  Procedure Laterality Date  . ABDOMINAL HYSTERECTOMY  2001  . BREAST BIOPSY  2010  . BREAST EXCISIONAL BIOPSY Right 1997    Benign excisional biopsy   . COLONOSCOPY  04/02/2014  . COLONOSCOPY WITH PROPOFOL N/A 12/27/2018   Procedure: COLONOSCOPY WITH PROPOFOL;  Surgeon: Lucilla Lame, MD;  Location[facility] ENDOSCOPY;  Service: Endoscopy;  Laterality: N/A;  . CYSTOSCOPY  11/2014  . ESOPHAGOGASTRODUODENOSCOPY (EGD) WITH PROPOFOL N/A 12/27/2018   Procedure: ESOPHAGOGASTRODUODENOSCOPY (EGD) WITH PROPOFOL;  Surgeon: Lucilla Lame, MD;  Location: [facility] ENDOSCOPY;  Service: Endoscopy;  Laterality: N/A;  . MASTECTOMY  1996   Left  . MELANOMA EXCISION  1995   Head    Family History  Problem Relation Age of Onset  . Breast cancer Mother   . Migraines Mother   . Diabetes Father   . CAD Father   . Thrombosis Father   . Biliary Cirrhosis Brother   . Breast cancer Other        Niece    Social History   Socioeconomic History  . Marital status: Single    Spouse name: Not on file  . Number of children: 0  . Years of education: Not on file  . Highest education level: Master's degree (e.g., MA, MS, MEng, MEd, MSW, MBA)  Occupational History  . Occupation: Retired  Tobacco Use  . Smoking status: Never Smoker  . Smokeless tobacco: Never Used  Substance and Sexual Activity  . Alcohol use: No    Alcohol/week: 0.0 standard drinks  . Drug use: Never  . Sexual activity: Not Currently  Other Topics Concern  . 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 Difficulty of Paying Living Expenses: Not hard at all  Food Insecurity: No Food Insecurity  . Worried About Charity fundraiser in the Last Year: Never true  . Ran Out of Food in the Last Year: Never true  Transportation Needs: No Transportation Needs  . Lack of Transportation (Medical): No  . Lack of Transportation (Non-Medical): No  Physical Activity:   . Days of Exercise per  Week:   . Minutes of Exercise per Session:   Stress: No Stress Concern Present  . Feeling of Stress : Only a little  Social Connections: Somewhat Isolated  . Frequency of Communication with Friends and Family: More than three times a week  . Frequency of Social Gatherings with Friends and Family: Three times a week  . Attends Religious Services: More than 4 times per year  . Active Member of Clubs or Organizations: No  . Attends Archivist Meetings: Never  . Marital Status: Never married  Intimate Partner Violence: Not At Risk  . Fear of Current or Ex-Partner: No  . Emotionally Abused: No  . Physically Abused: No  . Sexually Abused: No     Current  Outpatient Medications:  .  baclofen (LIORESAL) 10 MG tablet, Take 1 tablet (10 mg total) by mouth 3 (three) times daily as needed for muscle spasms., Disp: 30 each, Rfl: 0 .  butalbital-acetaminophen-caffeine (FIORICET) 50-325-40 MG tablet, Take 1 tablet by mouth every 6 (six) hours as needed (as needed)., Disp: 20 tablet, Rfl: 0 .  Calcium-Magnesium 500-250 MG TABS, Take 1 tablet by mouth 2 (two) times daily., Disp: , Rfl:  .  CINNAMON PO, Take 1 capsule by mouth daily. 350 mg, Disp: , Rfl:  .  Cranberry ([employer] CRANBERRY) 500 MG CAPS, Take 1 tablet by mouth daily., Disp: , Rfl:  .  Elastic Bandages & Supports (ULTRA SUPPORT SPINAL ORTHOSIS) MISC, Wear upper back (thoracic) support brace daily., Disp: , Rfl:  .  esomeprazole (NEXIUM) 20 MG capsule, Take 1-2 capsules (20-40 mg total) by mouth daily., Disp: 100 capsule, Rfl: 1 .  Misc Natural Products (LEG VEIN & CIRCULATION) TABS, Take 2 capsules by mouth daily., Disp: , Rfl:  .  Multiple Vitamins-Calcium (ESSENTIAL ONE DAILY MULTIVIT) TABS, Take by mouth., Disp: , Rfl:  .  Multiple Vitamins-Minerals (EYE SUPPORT PO), Take 1 capsule by mouth daily., Disp: , Rfl:  .  nitrofurantoin (MACRODANTIN) 100 MG capsule, Take 1 capsule (100 mg total) by mouth as needed. Take 1 capsule by mouth  daily as needed, Disp: 90 capsule, Rfl: 0 .  Omega-3 Fatty Acids (FISH OIL) 1000 MG CAPS, Take by mouth., Disp: , Rfl:   No Known Allergies  I personally reviewed active problem list, medication list, allergies, family history, social history with the patient/caregiver today.   ROS  Ten systems reviewed and is negative except as mentioned in HPI   Objective  Virtual encounter, vitals not obtained.  There is no height or weight on file to calculate BMI.  Physical Exam  Awake, alert and oriented  PHQ2/9: Depression screen [facility] 2/9 04/15/2020 01/30/2020 12/15/2019 08/18/2019 07/26/2019  Decreased Interest 1 0 1 1 1   Down, Depressed, Hopeless 1 0 1 1 1   PHQ - 2 Score 2 0 2 2 2   Altered sleeping 1 - 0 1 1  Tired, decreased energy 1 - 1 2 1   Change in appetite 0 - 0 1 1  Feeling bad or failure about yourself  1 - 0 1 0  Trouble concentrating 0 - 1 0 0  Moving slowly or fidgety/restless 0 - 0 2 0  Suicidal thoughts 0 - 0 - 0  PHQ-9 Score 5 - 4 9 5   Difficult doing work/chores Somewhat difficult - - Somewhat difficult Somewhat difficult  Some recent data might be hidden   PHQ-2/9 Result is positive.    Fall Risk: Fall Risk  04/15/2020 01/30/2020 12/15/2019 08/18/2019 07/26/2019  Falls in the past year? 0 0 0 0 0  Number falls in past yr: 0 0 0 0 0  Injury with Fall? 0 0 0 0 0  Risk for fall due to : - No Fall Risks - - -  Follow up - Falls prevention discussed - - -     Assessment & Plan  1. Gastroesophageal reflux disease without esophagitis  - esomeprazole (NEXIUM) 20 MG capsule; Take 1 capsule (20 mg total) by mouth daily.  Dispense: 90 capsule; Refill: 1  2. Mild depression ([place])  phq 9 is 5 today   3. Hyperglycemia   4. Prediabetes  Discussed low sugar diet , last A1C was 6.3 % and stable  5. Tension headache  She still has  mediation at home   6. Angina at rest [facility])  Stable   7. Vitamin D deficiency  On otc supplementation   8.  Hypertriglyceridemia   9. Recurrent UTI  Takes macrobid prn only   10. Muscle spasms of neck  Taking baclofen prn   11. Cervical radiculopathy  I discussed the assessment and treatment plan with the patient. The patient was provided an opportunity to ask questions and all were answered. The patient agreed with the plan and demonstrated an understanding of the instructions.   The patient was advised to call back or seek an in-person evaluation if the symptoms worsen or if the condition fails to improve as anticipated.  I provided 25  minutes of non-face-to-face time during this encounter.  Loistine Chance, MD

## 2020-05-08 DIAGNOSIS — M79675 Pain in left toe(s): Secondary | ICD-10-CM | POA: Diagnosis not present

## 2020-05-08 DIAGNOSIS — M79674 Pain in right toe(s): Secondary | ICD-10-CM | POA: Diagnosis not present

## 2020-05-08 DIAGNOSIS — B351 Tinea unguium: Secondary | ICD-10-CM | POA: Diagnosis not present

## 2020-06-24 ENCOUNTER — Other Ambulatory Visit: Payer: Self-pay

## 2020-06-24 ENCOUNTER — Encounter: Payer: Self-pay | Admitting: Dermatology

## 2020-06-24 ENCOUNTER — Ambulatory Visit: Payer: Medicare PPO | Admitting: Dermatology

## 2020-06-24 DIAGNOSIS — D485 Neoplasm of uncertain behavior of skin: Secondary | ICD-10-CM

## 2020-06-24 DIAGNOSIS — L821 Other seborrheic keratosis: Secondary | ICD-10-CM

## 2020-06-24 DIAGNOSIS — Z85820 Personal history of malignant melanoma of skin: Secondary | ICD-10-CM | POA: Diagnosis not present

## 2020-06-24 DIAGNOSIS — L720 Epidermal cyst: Secondary | ICD-10-CM

## 2020-06-24 DIAGNOSIS — D229 Melanocytic nevi, unspecified: Secondary | ICD-10-CM | POA: Diagnosis not present

## 2020-06-24 DIAGNOSIS — Z1283 Encounter for screening for malignant neoplasm of skin: Secondary | ICD-10-CM

## 2020-06-24 DIAGNOSIS — D2261 Melanocytic nevi of right upper limb, including shoulder: Secondary | ICD-10-CM | POA: Diagnosis not present

## 2020-06-24 DIAGNOSIS — L814 Other melanin hyperpigmentation: Secondary | ICD-10-CM | POA: Diagnosis not present

## 2020-06-24 DIAGNOSIS — Z853 Personal history of malignant neoplasm of breast: Secondary | ICD-10-CM

## 2020-06-24 DIAGNOSIS — L578 Other skin changes due to chronic exposure to nonionizing radiation: Secondary | ICD-10-CM | POA: Diagnosis not present

## 2020-06-24 DIAGNOSIS — D1800 Hemangioma unspecified site: Secondary | ICD-10-CM

## 2020-06-24 NOTE — Patient Instructions (Signed)

## 2020-06-24 NOTE — Progress Notes (Signed)
   Follow-Up Visit   Subjective  Christina Salas is a 86 y.o. female who presents for the following: Annual Exam (History of Melanoma, history of dysplastic nevus - TBSE today). The patient presents for Total-Body Skin Exam (TBSE) for skin cancer screening and mole check.  The following portions of the chart were reviewed this encounter and updated as appropriate:  Tobacco  Allergies  Meds  Problems  Med Hx  Surg Hx  Fam Hx      Review of Systems:  No other skin or systemic complaints except as noted in HPI or Assessment and Plan.  Objective  Well appearing patient in no apparent distress; mood and affect are within normal limits.  A full examination was performed including scalp, head, eyes, ears, nose, lips, neck, chest, axillae, abdomen, back, buttocks, bilateral upper extremities, bilateral lower extremities, hands, feet, fingers, toes, fingernails, and toenails. All findings within normal limits unless otherwise noted below.  Objective  Scalp: Well healed excision site.  Objective  right let deltoid: 0.5 cm irregular brown macule  Objective  Left Breast: No lymphadenopathy.  Objective  Right Upper Eyelid: Subcutaneous nodule.    Assessment & Plan    Lentigines - Scattered tan macules - Discussed due to sun exposure - Benign, observe - Call for any changes  Seborrheic Keratoses - Stuck-on, waxy, tan-brown papules and plaques  - Discussed benign etiology and prognosis. - Observe - Call for any changes  Melanocytic Nevi - Tan-brown and/or pink-flesh-colored symmetric macules and papules - Benign appearing on exam today - Observation - Call clinic for new or changing moles - Recommend daily use of broad spectrum spf 30+ sunscreen to sun-exposed areas.   Hemangiomas - Red papules - Discussed benign nature - Observe - Call for any changes  Actinic Damage - diffuse scaly erythematous macules with underlying dyspigmentation - Recommend daily broad  spectrum sunscreen SPF 30+ to sun-exposed areas, reapply every 2 hours as needed.  - Call for new or changing lesions.  Skin cancer screening performed today.   History of melanoma Scalp  Clear today. No lymphadenopathy.  Neoplasm of uncertain behavior of skin right let deltoid  Epidermal / dermal shaving  Lesion diameter (cm):  0.5 Informed consent: discussed and consent obtained   Timeout: patient name, date of birth, surgical site, and procedure verified   Procedure prep:  Patient was prepped and draped in usual sterile fashion Prep type:  Isopropyl alcohol Anesthesia: the lesion was anesthetized in a standard fashion   Anesthetic:  1% lidocaine w/ epinephrine 1-100,000 buffered w/ 8.4% NaHCO3 Instrument used: flexible razor blade   Hemostasis achieved with: pressure, aluminum chloride and electrodesiccation   Outcome: patient tolerated procedure well   Post-procedure details: sterile dressing applied and wound care instructions given   Dressing type: bandage and petrolatum   Additional details:  Post treatment defect - 0.9 cm  Specimen 1 - Surgical pathology Differential Diagnosis: Nevus vs dysplastic nevus Check Margins: No 0.5 cm irregular brown macule  History of breast cancer Left Breast Clear.  No LAD  Epidermal inclusion cyst Right Upper Eyelid  Discussed excision. Patient will schedule surgery appointment.  Return in about 1 year (around 06/24/2021) for TBSE and schedule a surgery appointment for cyst of right upper eyelid.   I, Ashok Cordia, CMA, am acting as scribe for Sarina Ser, MD .  Documentation: I have reviewed the above documentation for accuracy and completeness, and I agree with the above.  Sarina Ser, MD

## 2020-06-25 ENCOUNTER — Ambulatory Visit
Admission: RE | Admit: 2020-06-25 | Discharge: 2020-06-25 | Disposition: A | Discharge status: Home / Self Care | Payer: Medicare PPO | Source: Ambulatory Visit | Attending: Family Medicine | Admitting: Family Medicine

## 2020-06-25 DIAGNOSIS — Z1231 Encounter for screening mammogram for malignant neoplasm of breast: Secondary | ICD-10-CM | POA: Diagnosis not present

## 2020-06-27 ENCOUNTER — Encounter: Payer: Self-pay | Admitting: Dermatology

## 2020-06-27 ENCOUNTER — Other Ambulatory Visit: Payer: Self-pay | Admitting: Family Medicine

## 2020-06-27 ENCOUNTER — Telehealth: Payer: Self-pay

## 2020-06-27 DIAGNOSIS — N6489 Other specified disorders of breast: Secondary | ICD-10-CM

## 2020-06-27 DIAGNOSIS — R928 Other abnormal and inconclusive findings on diagnostic imaging of breast: Secondary | ICD-10-CM

## 2020-06-27 NOTE — Telephone Encounter (Signed)
Patient informed pathology results. Appointment already scheduled for August 2021.

## 2020-06-27 NOTE — Telephone Encounter (Signed)
-----   Message from Ralene Bathe, MD sent at 06/27/2020  8:59 AM EDT ----- Skin , right lat deltoid DYSPLASTIC JUNCTIONAL LENTIGINOUS NEVUS WITH MODERATE TO SEVERE ATYPIA, CLOSE TO MARGIN,  Dysplastic Moderate to severe Schedule shave removal

## 2020-07-10 ENCOUNTER — Ambulatory Visit
Admission: RE | Admit: 2020-07-10 | Discharge: 2020-07-10 | Disposition: A | Discharge status: Home / Self Care | Payer: Medicare PPO | Source: Ambulatory Visit | Attending: Family Medicine | Admitting: Family Medicine

## 2020-07-10 DIAGNOSIS — R928 Other abnormal and inconclusive findings on diagnostic imaging of breast: Secondary | ICD-10-CM

## 2020-07-10 DIAGNOSIS — N6489 Other specified disorders of breast: Secondary | ICD-10-CM | POA: Insufficient documentation

## 2020-07-12 ENCOUNTER — Ambulatory Visit: Admission: RE | Admit: 2020-07-12 | Payer: Medicare PPO | Source: Ambulatory Visit

## 2020-07-12 ENCOUNTER — Ambulatory Visit
Admission: RE | Admit: 2020-07-12 | Discharge: 2020-07-12 | Disposition: A | Discharge status: Home / Self Care | Payer: Medicare PPO | Source: Ambulatory Visit | Attending: Family Medicine | Admitting: Family Medicine

## 2020-07-12 DIAGNOSIS — R928 Other abnormal and inconclusive findings on diagnostic imaging of breast: Secondary | ICD-10-CM | POA: Insufficient documentation

## 2020-07-12 DIAGNOSIS — R922 Inconclusive mammogram: Secondary | ICD-10-CM | POA: Diagnosis not present

## 2020-07-12 DIAGNOSIS — N6489 Other specified disorders of breast: Secondary | ICD-10-CM | POA: Diagnosis not present

## 2020-07-24 DIAGNOSIS — R600 Localized edema: Secondary | ICD-10-CM | POA: Diagnosis not present

## 2020-07-24 DIAGNOSIS — I208 Other forms of angina pectoris: Secondary | ICD-10-CM | POA: Diagnosis not present

## 2020-07-24 DIAGNOSIS — R0600 Dyspnea, unspecified: Secondary | ICD-10-CM | POA: Diagnosis not present

## 2020-07-24 DIAGNOSIS — G629 Polyneuropathy, unspecified: Secondary | ICD-10-CM | POA: Diagnosis not present

## 2020-07-24 DIAGNOSIS — K219 Gastro-esophageal reflux disease without esophagitis: Secondary | ICD-10-CM | POA: Diagnosis not present

## 2020-08-06 ENCOUNTER — Encounter: Payer: Self-pay | Admitting: Dermatology

## 2020-08-06 ENCOUNTER — Other Ambulatory Visit: Payer: Self-pay

## 2020-08-06 ENCOUNTER — Ambulatory Visit: Payer: Medicare PPO | Admitting: Dermatology

## 2020-08-06 DIAGNOSIS — D239 Other benign neoplasm of skin, unspecified: Secondary | ICD-10-CM | POA: Diagnosis not present

## 2020-08-06 DIAGNOSIS — L720 Epidermal cyst: Secondary | ICD-10-CM

## 2020-08-06 DIAGNOSIS — D492 Neoplasm of unspecified behavior of bone, soft tissue, and skin: Secondary | ICD-10-CM

## 2020-08-06 DIAGNOSIS — D2361 Other benign neoplasm of skin of right upper limb, including shoulder: Secondary | ICD-10-CM

## 2020-08-06 MED ORDER — MUPIROCIN 2 % EX OINT: 1.0000 "application " | TOPICAL_OINTMENT | Freq: Every day | CUTANEOUS | 0 refills | Status: DC

## 2020-08-06 NOTE — Progress Notes (Signed)
   Follow-Up Visit   Subjective  Christina Salas is a 86 y.o. female who presents for the following: Cyst (R upper eyelid, pt presents for excision today) and Dysplastic Nevus bx proven (moderate to severe, R lateral deltoid).   The following portions of the chart were reviewed this encounter and updated as appropriate:  Tobacco  Allergies  Meds  Problems  Med Hx  Surg Hx  Fam Hx     Review of Systems:  No other skin or systemic complaints except as noted in HPI or Assessment and Plan.  Objective  Well appearing patient in no apparent distress; mood and affect are within normal limits.  A focused examination was performed including face. Relevant physical exam findings are noted in the Assessment and Plan.  Objective  R lateral deltoid: Pink bx site  Objective  R upper eyelid: Cystic pap 0.6cm   Assessment & Plan  Dysplastic nevus R lateral deltoid  Bx proven, moderate to severe atypia  Will plan shave removal on f/u  Neoplasm of skin R upper eyelid  Skin excision  Lesion length (cm):  0.6 Lesion width (cm):  0.6 Margin per side (cm):  0 Total excision diameter (cm):  0.6 Informed consent: discussed and consent obtained   Timeout: patient name, date of birth, surgical site, and procedure verified   Procedure prep:  Patient was prepped and draped in usual sterile fashion Prep type:  Povidone-iodine Anesthesia: the lesion was anesthetized in a standard fashion   Anesthetic:  1% lidocaine w/ epinephrine 1-100,000 buffered w/ 8.4% NaHCO3 Instrument used: scissors   Hemostasis achieved with: pressure and aluminum chloride   Hemostasis achieved with comment:  Electrocautery Outcome: patient tolerated procedure well with no complications   Post-procedure details: sterile dressing applied and wound care instructions given   Dressing type: bacitracin   Additional details:  2ndary intention healing  mupirocin ointment (BACTROBAN) 2 %  Specimen 1 - Surgical  pathology Differential Diagnosis: D48.5 Cyst vs other Check Margins: No Cystic pap 0.6cm  Return in about 1 week (around 08/13/2020) for recheck excision site and shave removal of dysplastic nevus.  I, Othelia Pulling, RMA, am acting as scribe for Sarina Ser, MD .  Documentation: I have reviewed the above documentation for accuracy and completeness, and I agree with the above.  Sarina Ser, MD

## 2020-08-06 NOTE — Patient Instructions (Signed)

## 2020-08-07 ENCOUNTER — Telehealth: Payer: Self-pay

## 2020-08-07 ENCOUNTER — Encounter: Payer: Self-pay | Admitting: Dermatology

## 2020-08-07 NOTE — Telephone Encounter (Signed)
Pt doing well after yesterday's surgery.  Advised pt to call office if any problems, otherwise we would see her next week at her post op appointment./sh

## 2020-08-12 ENCOUNTER — Telehealth: Payer: Self-pay

## 2020-08-12 DIAGNOSIS — M79675 Pain in left toe(s): Secondary | ICD-10-CM | POA: Diagnosis not present

## 2020-08-12 DIAGNOSIS — M79674 Pain in right toe(s): Secondary | ICD-10-CM | POA: Diagnosis not present

## 2020-08-12 DIAGNOSIS — B351 Tinea unguium: Secondary | ICD-10-CM | POA: Diagnosis not present

## 2020-08-12 NOTE — Telephone Encounter (Signed)
-----   Message from Ralene Bathe, MD sent at 08/12/2020  1:13 PM EDT ----- Skin , right upper eyelid EPIDERMAL INCLUSION CYST  Benign cyst

## 2020-08-12 NOTE — Telephone Encounter (Signed)
Patient informed of results.  

## 2020-08-13 ENCOUNTER — Ambulatory Visit: Payer: Medicare PPO | Admitting: Dermatology

## 2020-08-13 ENCOUNTER — Other Ambulatory Visit: Payer: Self-pay

## 2020-08-13 DIAGNOSIS — L720 Epidermal cyst: Secondary | ICD-10-CM

## 2020-08-13 DIAGNOSIS — D2261 Melanocytic nevi of right upper limb, including shoulder: Secondary | ICD-10-CM | POA: Diagnosis not present

## 2020-08-13 DIAGNOSIS — D239 Other benign neoplasm of skin, unspecified: Secondary | ICD-10-CM

## 2020-08-13 DIAGNOSIS — L988 Other specified disorders of the skin and subcutaneous tissue: Secondary | ICD-10-CM | POA: Diagnosis not present

## 2020-08-13 NOTE — Patient Instructions (Signed)

## 2020-08-13 NOTE — Progress Notes (Signed)
   Follow-Up Visit   Subjective  Christina Salas is a 86 y.o. female who presents for the following: dysplastic nevus (moderate to severe - patient is here today for shave removal ) and cyst (of the right upper eyelid S/P excision - patient is here today for a recheck).  The following portions of the chart were reviewed this encounter and updated as appropriate:  Tobacco  Allergies  Meds  Problems  Med Hx  Surg Hx  Fam Hx     Review of Systems:  No other skin or systemic complaints except as noted in HPI or Assessment and Plan.  Objective  Well appearing patient in no apparent distress; mood and affect are within normal limits.  A focused examination was performed including the face and right arm. Relevant physical exam findings are noted in the Assessment and Plan.  Objective  R lat deltoid: Pink biopsy site 1.2 cm   Objective  R upper eyelid: Healing excision site  Assessment & Plan  Dysplastic nevus R lat deltoid  Epidermal / dermal shaving - R lat deltoid  Lesion diameter (cm):  1.2 Informed consent: discussed and consent obtained   Timeout: patient name, date of birth, surgical site, and procedure verified   Procedure prep:  Patient was prepped and draped in usual sterile fashion Prep type:  Isopropyl alcohol Anesthesia: the lesion was anesthetized in a standard fashion   Anesthetic:  1% lidocaine w/ epinephrine 1-100,000 buffered w/ 8.4% NaHCO3 Instrument used: flexible razor blade   Hemostasis achieved with: pressure, aluminum chloride and electrodesiccation   Outcome: patient tolerated procedure well   Post-procedure details: sterile dressing applied and wound care instructions given   Dressing type: bandage and petrolatum    Specimen 1 - Surgical pathology Differential Diagnosis: D48.5 r/o persistent mod-severe dysplastic nevus  Check Margins: No Pink biopsy site 1.2 cm [id number]  Epidermal inclusion cyst R upper eyelid  Pathology proven benign cyst  - continue wound care daily until healed  Return in about 2 months (around 10/13/2020).  Luther Redo, CMA, am acting as scribe for Sarina Ser, MD .  Documentation: I have reviewed the above documentation for accuracy and completeness, and I agree with the above.  Sarina Ser, MD

## 2020-08-19 ENCOUNTER — Telehealth: Payer: Self-pay

## 2020-08-19 NOTE — Telephone Encounter (Signed)
Patient informed of pathology results 

## 2020-08-19 NOTE — Telephone Encounter (Signed)
-----   Message from Ralene Bathe, MD sent at 08/15/2020  6:37 PM EDT ----- Skin , right lat deltoid NO RESIDUAL DYSPLASTIC NEVUS, MARGINS FREE  Moderate to severe dysplastic Margins clear Recheck next visit

## 2020-08-20 ENCOUNTER — Encounter: Payer: Self-pay | Admitting: Dermatology

## 2020-09-13 ENCOUNTER — Encounter: Payer: Self-pay | Admitting: Urology

## 2020-09-13 ENCOUNTER — Ambulatory Visit (INDEPENDENT_AMBULATORY_CARE_PROVIDER_SITE_OTHER): Payer: Medicare PPO | Admitting: Urology

## 2020-09-13 ENCOUNTER — Other Ambulatory Visit: Payer: Self-pay

## 2020-09-13 VITALS — BP 124/75 | HR 73 | Ht 63.0 in | Wt 162.0 lb

## 2020-09-13 DIAGNOSIS — N390 Urinary tract infection, site not specified: Secondary | ICD-10-CM

## 2020-09-13 DIAGNOSIS — R3129 Other microscopic hematuria: Secondary | ICD-10-CM

## 2020-09-13 NOTE — Patient Instructions (Addendum)
Cystoscopy Cystoscopy is a procedure that is used to help diagnose and sometimes treat conditions that affect the lower urinary tract. The lower urinary tract includes the bladder and the urethra. The urethra is the tube that drains urine from the bladder. Cystoscopy is done using a thin, tube-shaped instrument with a light and camera at the end (cystoscope). The cystoscope may be hard or flexible, depending on the goal of the procedure. The cystoscope is inserted through the urethra, into the bladder. Cystoscopy may be recommended if you have:  Urinary tract infections that keep coming back.  Blood in the urine (hematuria).  An inability to control when you urinate (urinary incontinence) or an overactive bladder.  Unusual cells found in a urine sample.  A blockage in the urethra, such as a urinary stone.  Painful urination.  An abnormality in the bladder found during an intravenous pyelogram (IVP) or CT scan. Cystoscopy may also be done to remove a sample of tissue to be examined under a microscope (biopsy). What are the risks? Generally, this is a safe procedure. However, problems may occur, including:  Infection.  Bleeding.  What happens during the procedure?  1. You will be given one or more of the following: ? A medicine to numb the area (local anesthetic). 2. The area around the opening of your urethra will be cleaned. 3. The cystoscope will be passed through your urethra into your bladder. 4. Germ-free (sterile) fluid will flow through the cystoscope to fill your bladder. The fluid will stretch your bladder so that your health care provider can clearly examine your bladder walls. 5. Your doctor will look at the urethra and bladder. 6. The cystoscope will be removed The procedure may vary among health care providers  What can I expect after the procedure? After the procedure, it is common to have: 1. Some soreness or pain in your abdomen and urethra. 2. Urinary symptoms.  These include: ? Mild pain or burning when you urinate. Pain should stop within a few minutes after you urinate. This may last for up to 1 week. ? A small amount of blood in your urine for several days. ? Feeling like you need to urinate but producing only a small amount of urine. Follow these instructions at home: General instructions  Return to your normal activities as told by your health care provider.   Do not drive for 24 hours if you were given a sedative during your procedure.  Watch for any blood in your urine. If the amount of blood in your urine increases, call your health care provider.  If a tissue sample was removed for testing (biopsy) during your procedure, it is up to you to get your test results. Ask your health care provider, or the department that is doing the test, when your results will be ready.  Drink enough fluid to keep your urine pale yellow.  Keep all follow-up visits as told by your health care provider. This is important. Contact a health care provider if you:  Have pain that gets worse or does not get better with medicine, especially pain when you urinate.  Have trouble urinating.  Have more blood in your urine. Get help right away if you:  Have blood clots in your urine.  Have abdominal pain.  Have a fever or chills.  Are unable to urinate. Summary  Cystoscopy is a procedure that is used to help diagnose and sometimes treat conditions that affect the lower urinary tract.  Cystoscopy is done using   a thin, tube-shaped instrument with a light and camera at the end.  After the procedure, it is common to have some soreness or pain in your abdomen and urethra.  Watch for any blood in your urine. If the amount of blood in your urine increases, call your health care provider.  If you were prescribed an antibiotic medicine, take it as told by your health care provider. Do not stop taking the antibiotic even if you start to feel better. This  information is not intended to replace advice given to you by your health care provider. Make sure you discuss any questions you have with your health care provider. Document Revised: 12/06/2018 Document Reviewed: 12/06/2018 Elsevier Patient Education  2020 [employer]   

## 2020-09-13 NOTE — Progress Notes (Signed)
09/13/2020 10:35 AM   Deliana I Gilford Rile 12-10-1933 [id number]  Referring provider: Steele Sizer, MD [address] [place],  [place] [address]  Chief Complaint  Patient presents with  . Recurrent UTI    HPI:  F/u -bacteriuria, urgency and urge incontinence --   1) recurrent UTI. She typically gets bladder pain and dysuria, but recently she has been more asymptomatic. She followed with Dr. Jacqlyn Larsen and last saw him [phone] and was doing well on nitrofurantoin as needed.She takes NF sparingly.  2) mixed incontinence with frequency and urgency.She voids with a good flow. Occasional urgency and UUI. Postvoid residual was 0. She typically drinks milk and a diet orange drink. Not bothered. She had a hysterectomy. No NG risk.  She returns and hasn't had any issues until three days ago. She developed dysuria. No gross hematuria but has had dark urine. NF is helping. She takes NF once or twice a day for a day or two and symptoms resolve. She has not been drinking enough water and knows she needs to drink more. No constipation. UA today with few bacteria and MH with 3-10 rbc.    PMH: Past Medical History:  Diagnosis Date  . Abnormal glucose   . Anxiety   . Arthritis   . Breast cancer ([place]) 1996   Left- mastectomy  . Cancer ([place])    melanoma  . Chronic headaches   . Depression   . Dysplastic nevus 05/11/2007   right upper gastric area. Slight to moderate atypia.  [name] Dysplastic nevus 06/24/2020   R lateral deltoid, moderate to severe atypia  . GERD (gastroesophageal reflux disease)   . H/O melanoma excision   . History of diverticulitis   . History of recurrent UTIs   . Melanoma ([place])    scalp  . Osteoporosis    pre-osetoporosis per patient history form 03/18/12  . Pre-diabetes   . Vitamin D deficiency     Surgical History: Past Surgical History:  Procedure Laterality Date  . ABDOMINAL HYSTERECTOMY  2001  . BREAST BIOPSY  2010  . BREAST EXCISIONAL  BIOPSY Right 1997   Benign excisional biopsy   . COLONOSCOPY  04/02/2014  . COLONOSCOPY WITH PROPOFOL N/A 12/27/2018   Procedure: COLONOSCOPY WITH PROPOFOL;  Surgeon: Lucilla Lame, MD;  Location: [facility] ENDOSCOPY;  Service: Endoscopy;  Laterality: N/A;  . CYSTOSCOPY  11/2014  . ESOPHAGOGASTRODUODENOSCOPY (EGD) WITH PROPOFOL N/A 12/27/2018   Procedure: ESOPHAGOGASTRODUODENOSCOPY (EGD) WITH PROPOFOL;  Surgeon: Lucilla Lame, MD;  Location: [facility] ENDOSCOPY;  Service: Endoscopy;  Laterality: N/A;  . MASTECTOMY  1996   Left  . MELANOMA EXCISION  1995   Head    Home Medications:  Allergies as of 09/13/2020   No Known Allergies     Medication List       Accurate as of September 13, 2020 10:35 AM. If you have any questions, ask your nurse or doctor.        STOP taking these medications   EYE SUPPORT PO Stopped by: Festus Aloe, MD   nitrofurantoin 100 MG capsule Commonly known as: MACRODANTIN Stopped by: Festus Aloe, MD   Ultra Support Spinal Orthosis Misc Stopped by: Festus Aloe, MD     TAKE these medications   alendronate 70 MG tablet Commonly known as: FOSAMAX   baclofen 10 MG tablet Commonly known as: LIORESAL Take 1 tablet (10 mg total) by mouth 3 (three) times daily as needed for muscle spasms. What changed: when to take this   butalbital-acetaminophen-caffeine  50-325-40 MG tablet Commonly known as: FIORICET Take 1 tablet by mouth every 6 (six) hours as needed (as needed).   Calcium-Magnesium 500-250 MG Tabs Take 1 tablet by mouth 2 (two) times daily.   CINNAMON PO Take 1 capsule by mouth daily. 350 mg   [employer] Cranberry 500 MG Caps Generic drug: Cranberry Take 1 tablet by mouth daily.   esomeprazole 20 MG capsule Commonly known as: NEXIUM Take 1 capsule (20 mg total) by mouth daily.   Essential One Daily Multivit Tabs Take by mouth.   Fish Oil 1000 MG Caps Take by mouth.   Leg Vein & Circulation Tabs Take 2 capsules by mouth daily.    mupirocin ointment 2 % Commonly known as: BACTROBAN Apply 1 application topically daily. Qd to excision site       Allergies: No Known Allergies  Family History: Family History  Problem Relation Age of Onset  . Breast cancer Mother   . Migraines Mother   . Diabetes Father   . CAD Father   . Thrombosis Father   . Biliary Cirrhosis Brother   . Breast cancer Other        Niece    Social History:  reports that she has never smoked. She has never used smokeless tobacco. She reports that she does not drink alcohol and does not use drugs.   Physical Exam: BP 124/75 (BP Location: Left Arm, Patient Position: Sitting, Cuff Size: Large)   Pulse 73   Ht 5\' 3"  (1.6 m)   Wt 162 lb (73.5 kg)   BMI 28.70 kg/m   Constitutional:  Alert and oriented, No acute distress. HEENT: [place] AT, moist mucus membranes.  Trachea midline, no masses. Cardiovascular: No clubbing, cyanosis, or edema. Respiratory: Normal respiratory effort, no increased work of breathing. GI: Abdomen is soft, nontender, nondistended, no abdominal masses GU: No CVA tenderness Skin: No rashes, bruises or suspicious lesions. Neurologic: Grossly intact, no focal deficits, moving all 4 extremities. Psychiatric: Normal mood and affect.  Laboratory Data: Lab Results  Component Value Date   WBC 6.8 08/18/2019   HGB 12.6 08/18/2019   HCT 39.7 08/18/2019   MCV 78.1 (L) 08/18/2019   PLT 243 08/18/2019    Lab Results  Component Value Date   CREATININE 0.85 08/18/2019    No results found for: PSA  No results found for: TESTOSTERONE  Lab Results  Component Value Date   HGBA1C 6.3 (H) 08/18/2019    Urinalysis    Component Value Date/Time   COLORURINE Yellow 02/23/2013 0938   APPEARANCEUR Clear 02/22/2019 1010   LABSPEC 1.012 02/23/2013 0938   PHURINE 6.0 02/23/2013 0938   GLUCOSEU Negative 02/22/2019 1010   GLUCOSEU Negative 02/23/2013 0938   HGBUR 1+ 02/23/2013 0938   BILIRUBINUR Negative 02/22/2019 1010    BILIRUBINUR Negative 02/23/2013 0938   KETONESUR Negative 02/23/2013 0938   PROTEINUR Trace (A) 02/22/2019 1010   PROTEINUR Negative 02/23/2013 0938   NITRITE Negative 02/22/2019 1010   NITRITE Negative 02/23/2013 0938   LEUKOCYTESUR Negative 02/22/2019 1010   LEUKOCYTESUR Negative 02/23/2013 0938    Lab Results  Component Value Date   LABMICR See below: 02/22/2019   WBCUA 0-5 02/22/2019   RBCUA None seen 02/22/2019   LABEPIT 0-10 02/22/2019   MUCUS Present (A) 02/22/2019   BACTERIA Few 02/22/2019    Pertinent Imaging: n/a No results found for this or any previous visit.  No results found for this or any previous visit.  No results found for this or  any previous visit.  No results found for this or any previous visit.  No results found for this or any previous visit.  No results found for this or any previous visit.  No results found for this or any previous visit.  No results found for this or any previous visit.   Assessment & Plan:    1. Recurrent UTI Cont NF as needed  - Urinalysis, Complete  2. MH - plan renal US and cystoscopy - discussed with pt.   No follow-ups on file.  Festus Aloe, MD  [facility] [address] [place], [place] [address] [phone]

## 2020-09-16 LAB — MICROSCOPIC EXAMINATION

## 2020-09-16 LAB — URINALYSIS, COMPLETE
Bilirubin, UA: NEGATIVE
Glucose, UA: NEGATIVE
Leukocytes,UA: NEGATIVE
Nitrite, UA: NEGATIVE
Specific Gravity, UA: >1.03 — ABNORMAL HIGH (ref 1.005–1.030)
Urobilinogen, Ur: 0.2 mg/dL (ref 0.2–1.0)
pH, UA: 5.5 (ref 5.0–7.5)

## 2020-09-20 ENCOUNTER — Other Ambulatory Visit: Payer: Self-pay | Admitting: Family Medicine

## 2020-09-20 DIAGNOSIS — K219 Gastro-esophageal reflux disease without esophagitis: Secondary | ICD-10-CM

## 2020-09-20 DIAGNOSIS — M5412 Radiculopathy, cervical region: Secondary | ICD-10-CM

## 2020-09-20 LAB — CULTURE, URINE COMPREHENSIVE

## 2020-09-20 MED ORDER — BACLOFEN 10 MG PO TABS: 10.0000 mg | ORAL_TABLET | Freq: Three times a day (TID) | ORAL | 0 refills | Status: DC | PRN

## 2020-09-20 MED ORDER — ESOMEPRAZOLE MAGNESIUM 20 MG PO CPDR: 20.0000 mg | DELAYED_RELEASE_CAPSULE | Freq: Every day | ORAL | 1 refills | Status: DC

## 2020-09-20 NOTE — Telephone Encounter (Signed)
Copied from [phone]. Topic: General - Other >> Sep 20, 2020  9:59 AM Rainey Pines A wrote: Patient recently had mole removed from right arm and patient wants to know would Dr. Ancil Boozer advise she get shot in the left arm instead. Patient also wanting clarification from Dr. Ancil Boozer on which shots she should get first. Please advise  Called patient. Informed her that she can get both flu and covid vaccines at the same time. Doesn't matter which arm she gets it in. She also needs refills on her Baclofen and Nexium.

## 2020-09-20 NOTE — Telephone Encounter (Signed)
Requested medication (s) are due for refill today: yes  Requested medication (s) are on the active medication list: yes  Last refill: 07/26/19  #30  0 refills  Future visit scheduled: yes  Notes to clinic:  Med not delegated    Requested Prescriptions  Pending Prescriptions Disp Refills   baclofen (LIORESAL) 10 MG tablet 30 each 0    Sig: Take 1 tablet (10 mg total) by mouth 3 (three) times daily as needed for muscle spasms.      Not Delegated - Analgesics:  Muscle Relaxants Failed - 09/20/2020 11:08 AM      Failed - This refill cannot be delegated      Passed - Valid encounter within last 6 months    Recent Outpatient Visits           5 months ago Mild depression [facility])   [facility] Steele Sizer, MD   9 months ago Tension headache   [facility] Steele Sizer, MD   1 year ago Cervical radiculopathy   [facility] Steele Sizer, MD   1 year ago Cervical radiculopathy   [facility] Steele Sizer, MD   1 year ago Mild depression [facility])   [facility] Steele Sizer, MD       Future Appointments             In 3 weeks Ralene Bathe, MD [place]   In 3 weeks Steele Sizer, MD [facility], [place]   In 4 months  [facility], [facility]             Signed Prescriptions Disp Refills   esomeprazole (NEXIUM) 20 MG capsule 90 capsule 1    Sig: Take 1 capsule (20 mg total) by mouth daily.      Gastroenterology: Proton Pump Inhibitors Passed - 09/20/2020 11:08 AM      Passed - Valid encounter within last 12 months    Recent Outpatient Visits           5 months ago Mild depression [facility])   [facility] Steele Sizer, MD   9 months ago Tension headache   [facility] Steele Sizer, MD   1 year ago Cervical radiculopathy   [facility] Steele Sizer, MD   1  year ago Cervical radiculopathy   [facility] Steele Sizer, MD   1 year ago Mild depression [facility])   [facility] Steele Sizer, MD       Future Appointments             In 3 weeks Ralene Bathe, MD [place]   In 3 weeks Steele Sizer, MD [facility], [place]   In 4 months  [facility], [facility]

## 2020-09-20 NOTE — Telephone Encounter (Signed)
Requested Prescriptions  Pending Prescriptions Disp Refills  . baclofen (LIORESAL) 10 MG tablet 30 each 0    Sig: Take 1 tablet (10 mg total) by mouth 3 (three) times daily as needed for muscle spasms.     Not Delegated - Analgesics:  Muscle Relaxants Failed - 09/20/2020 11:08 AM      Failed - This refill cannot be delegated      Passed - Valid encounter within last 6 months    Recent Outpatient Visits          5 months ago Mild depression [facility])   [facility] Steele Sizer, MD   9 months ago Tension headache   [facility] Steele Sizer, MD   1 year ago Cervical radiculopathy   [facility] Steele Sizer, MD   1 year ago Cervical radiculopathy   [facility] Steele Sizer, MD   1 year ago Mild depression [facility])   [facility] Steele Sizer, MD      Future Appointments            In 3 weeks Ralene Bathe, MD [place]   In 3 weeks Steele Sizer, MD [facility], [place]   In 4 months  [facility]           . esomeprazole (NEXIUM) 20 MG capsule 90 capsule 1    Sig: Take 1 capsule (20 mg total) by mouth daily.     Gastroenterology: Proton Pump Inhibitors Passed - 09/20/2020 11:08 AM      Passed - Valid encounter within last 12 months    Recent Outpatient Visits          5 months ago Mild depression [facility])   [facility] Steele Sizer, MD   9 months ago Tension headache   [facility] Steele Sizer, MD   1 year ago Cervical radiculopathy   [facility] Steele Sizer, MD   1 year ago Cervical radiculopathy   [facility] Steele Sizer, MD   1 year ago Mild depression [facility])   [facility] Steele Sizer, MD      Future Appointments            In 3 weeks Ralene Bathe, MD [place]   In 3 weeks  Steele Sizer, MD [facility], [place]   In 4 months  [facility], [facility]

## 2020-09-20 NOTE — Telephone Encounter (Signed)
Medication Refill - Medication:baclofen (LIORESAL) 10 MG tablet,esomeprazole (NEXIUM) 20 MG capsule(90 day supply)    Has the patient contacted their pharmacy?yes (Agent: If no, request that the patient contact the pharmacy for the refill.) (Agent: If yes, when and what did the pharmacy advise?)Contact PCP  Preferred Pharmacy (with phone number or street name):  [id number] - [place], [place] - [place] Phone:  [phone]  Fax:  [phone]       Agent: Please be advised that RX refills may take up to 3 business days. We ask that you follow-up with your pharmacy.

## 2020-09-26 ENCOUNTER — Ambulatory Visit
Admission: RE | Admit: 2020-09-26 | Discharge: 2020-09-26 | Disposition: A | Discharge status: Home / Self Care | Payer: Medicare PPO | Source: Ambulatory Visit | Attending: Urology | Admitting: Urology

## 2020-09-26 ENCOUNTER — Other Ambulatory Visit: Payer: Self-pay

## 2020-09-26 DIAGNOSIS — N281 Cyst of kidney, acquired: Secondary | ICD-10-CM | POA: Diagnosis not present

## 2020-09-26 DIAGNOSIS — N390 Urinary tract infection, site not specified: Secondary | ICD-10-CM | POA: Diagnosis not present

## 2020-09-26 DIAGNOSIS — R3129 Other microscopic hematuria: Secondary | ICD-10-CM | POA: Diagnosis not present

## 2020-09-27 ENCOUNTER — Encounter: Payer: Self-pay | Admitting: Urology

## 2020-09-27 ENCOUNTER — Ambulatory Visit (INDEPENDENT_AMBULATORY_CARE_PROVIDER_SITE_OTHER): Payer: Medicare PPO | Admitting: Urology

## 2020-09-27 VITALS — BP 163/74 | HR 92 | Ht 61.0 in | Wt 159.0 lb

## 2020-09-27 DIAGNOSIS — R3129 Other microscopic hematuria: Secondary | ICD-10-CM

## 2020-09-27 DIAGNOSIS — N390 Urinary tract infection, site not specified: Secondary | ICD-10-CM | POA: Diagnosis not present

## 2020-09-27 NOTE — Progress Notes (Signed)
   09/27/20  CC:  Chief Complaint  Patient presents with  . Cysto    HPI:  F/u -  1)recurrent UTI. She typically gets bladder pain and dysuria, but 09/21 she was more asymptomatic. She followed with Dr. Jacqlyn Larsen and last saw him [phone] and was doing well on nitrofurantoin as needed.She takes NF sparingly.  2)mixed incontinence with frequency and urgency.She voids with a good flow. Occasional urgency and UUI. Postvoid residual was 0. She typically drinks milk and a diet orange drink.Not bothered.She had a hysterectomy. No NG risk.  3) MH - she had 3-10 rbc per hpf on UA 09/21.   She returns in eval for MH. Renal US done yesterday and read pending. Possible left parapelvic cyst vs hydro unofficially.   There were no vitals taken for this visit. NED. A&Ox3.   No respiratory distress   Abd soft, NT, ND Normal external genitalia with patent urethral meatus  Cystoscopy Procedure Note  Patient identification was confirmed, informed consent was obtained, and patient was prepped using Betadine solution.  Lidocaine jelly was administered per urethral meatus.    Procedure: - Flexible cystoscope introduced, without any difficulty.   - Thorough search of the bladder revealed:    normal urethral meatus    normal urothelium    no stones    no ulcers     no tumors    no urethral polyps    no trabeculation  - Ureteral orifices were normal in position and appearance.  Judson Roch was chaperone.   Post-Procedure: - Patient tolerated the procedure well  Assessment/ Plan:  MH - cysto benign, renal US pending. Discussed poss renal cyst.   No follow-ups on file.  Festus Aloe, MD

## 2020-09-27 NOTE — Patient Instructions (Signed)

## 2020-09-28 LAB — URINALYSIS, COMPLETE
Bilirubin, UA: NEGATIVE
Glucose, UA: NEGATIVE
Ketones, UA: NEGATIVE
Leukocytes,UA: NEGATIVE
Nitrite, UA: NEGATIVE
Protein,UA: NEGATIVE
Specific Gravity, UA: >1.03 — ABNORMAL HIGH (ref 1.005–1.030)
Urobilinogen, Ur: 0.2 mg/dL (ref 0.2–1.0)
pH, UA: 5 (ref 5.0–7.5)

## 2020-09-28 LAB — MICROSCOPIC EXAMINATION
Bacteria, UA: NONE SEEN
RBC, Urine: NONE SEEN /hpf (ref 0–2)

## 2020-09-30 ENCOUNTER — Telehealth: Payer: Self-pay

## 2020-09-30 NOTE — Telephone Encounter (Signed)
-----   Message from Festus Aloe, MD sent at 09/30/2020  9:24 AM EDT ----- Let Ms Benay Spice know her renal US was benign. Kidneys look good with no worrisome findings.  ----- Message ----- From: Chrystie Nose, CMA Sent: 09/30/2020   7:39 AM EDT To: Festus Aloe, MD   ----- Message ----- From: Interface, Rad Results In Sent: 09/27/2020   4:47 PM EDT To: [facility]

## 2020-09-30 NOTE — Telephone Encounter (Signed)
Notified patient as advised, patient verbalized understanding.  

## 2020-10-01 ENCOUNTER — Telehealth: Payer: Self-pay | Admitting: Family Medicine

## 2020-10-01 NOTE — Telephone Encounter (Signed)
Copied from [place] [phone]. Topic: General - Other >> Sep 25, 2020 11:27 AM Leward Quan A wrote: Reason for CRM: Patient called to ask Dr Ancil Boozer what would she recommend should she have the Flu shot or the booster shot first. Please call with an answer Ph# [phone]

## 2020-10-07 NOTE — Telephone Encounter (Signed)
Patient called.  Patient aware.  

## 2020-10-15 ENCOUNTER — Encounter: Payer: Self-pay | Admitting: Family Medicine

## 2020-10-15 ENCOUNTER — Ambulatory Visit (INDEPENDENT_AMBULATORY_CARE_PROVIDER_SITE_OTHER): Payer: Medicare PPO | Admitting: Dermatology

## 2020-10-15 ENCOUNTER — Other Ambulatory Visit: Payer: Self-pay

## 2020-10-15 ENCOUNTER — Encounter: Payer: Self-pay | Admitting: Dermatology

## 2020-10-15 ENCOUNTER — Ambulatory Visit: Payer: Medicare PPO | Admitting: Family Medicine

## 2020-10-15 VITALS — BP 148/80 | HR 86 | Temp 97.8°F | Resp 18 | Ht 61.0 in | Wt 158.9 lb

## 2020-10-15 DIAGNOSIS — E611 Iron deficiency: Secondary | ICD-10-CM

## 2020-10-15 DIAGNOSIS — M8580 Other specified disorders of bone density and structure, unspecified site: Secondary | ICD-10-CM

## 2020-10-15 DIAGNOSIS — K219 Gastro-esophageal reflux disease without esophagitis: Secondary | ICD-10-CM

## 2020-10-15 DIAGNOSIS — Z86018 Personal history of other benign neoplasm: Secondary | ICD-10-CM | POA: Diagnosis not present

## 2020-10-15 DIAGNOSIS — R7303 Prediabetes: Secondary | ICD-10-CM

## 2020-10-15 DIAGNOSIS — F320 Major depressive disorder, single episode, mild: Secondary | ICD-10-CM

## 2020-10-15 DIAGNOSIS — L821 Other seborrheic keratosis: Secondary | ICD-10-CM | POA: Diagnosis not present

## 2020-10-15 DIAGNOSIS — R739 Hyperglycemia, unspecified: Secondary | ICD-10-CM

## 2020-10-15 DIAGNOSIS — I208 Other forms of angina pectoris: Secondary | ICD-10-CM

## 2020-10-15 DIAGNOSIS — M62838 Other muscle spasm: Secondary | ICD-10-CM

## 2020-10-15 DIAGNOSIS — G44209 Tension-type headache, unspecified, not intractable: Secondary | ICD-10-CM

## 2020-10-15 DIAGNOSIS — Z780 Asymptomatic menopausal state: Secondary | ICD-10-CM

## 2020-10-15 DIAGNOSIS — Z23 Encounter for immunization: Secondary | ICD-10-CM | POA: Diagnosis not present

## 2020-10-15 DIAGNOSIS — E441 Mild protein-calorie malnutrition: Secondary | ICD-10-CM

## 2020-10-15 DIAGNOSIS — E781 Pure hyperglyceridemia: Secondary | ICD-10-CM | POA: Diagnosis not present

## 2020-10-15 DIAGNOSIS — F32A Depression, unspecified: Secondary | ICD-10-CM

## 2020-10-15 DIAGNOSIS — E559 Vitamin D deficiency, unspecified: Secondary | ICD-10-CM

## 2020-10-15 DIAGNOSIS — M5412 Radiculopathy, cervical region: Secondary | ICD-10-CM

## 2020-10-15 DIAGNOSIS — R634 Abnormal weight loss: Secondary | ICD-10-CM

## 2020-10-15 DIAGNOSIS — I2089 Other forms of angina pectoris: Secondary | ICD-10-CM

## 2020-10-15 DIAGNOSIS — L578 Other skin changes due to chronic exposure to nonionizing radiation: Secondary | ICD-10-CM | POA: Diagnosis not present

## 2020-10-15 DIAGNOSIS — K449 Diaphragmatic hernia without obstruction or gangrene: Secondary | ICD-10-CM

## 2020-10-15 MED ORDER — BUTALBITAL-APAP-CAFFEINE 50-325-40 MG PO TABS: 1.0000 | ORAL_TABLET | Freq: Four times a day (QID) | ORAL | 0 refills | Status: DC | PRN

## 2020-10-15 MED ORDER — BACLOFEN 10 MG PO TABS: 10.0000 mg | ORAL_TABLET | Freq: Three times a day (TID) | ORAL | 0 refills | Status: DC | PRN

## 2020-10-15 NOTE — Progress Notes (Signed)
   Follow-Up Visit   Subjective  [place] is a 86 y.o. female who presents for the following: Follow-up (Moderate to severe dysplastic nevus of right lat deltoid - treated with shave removal 08/13/2020).  The following portions of the chart were reviewed this encounter and updated as appropriate:  Tobacco  Allergies  Meds  Problems  Med Hx  Surg Hx  Fam Hx     Review of Systems:  No other skin or systemic complaints except as noted in HPI or Assessment and Plan.  Objective  Well appearing patient in no apparent distress; mood and affect are within normal limits.  A focused examination was performed including face, arms. Relevant physical exam findings are noted in the Assessment and Plan.  Objective  Right lat deltoid: Well healed shave removal site   Assessment & Plan    Actinic Damage - diffuse scaly erythematous macules with underlying dyspigmentation - Recommend daily broad spectrum sunscreen SPF 30+ to sun-exposed areas, reapply every 2 hours as needed.  - Call for new or changing lesions.  Seborrheic Keratoses - Stuck-on, waxy, tan-brown papules and plaques  - Discussed benign etiology and prognosis. - Observe - Call for any changes   History of dysplastic nevus Right lat deltoid  Clear. Observe for recurrence. Call clinic for new or changing lesions.  Recommend regular skin exams, daily broad-spectrum spf 30+ sunscreen use, and photoprotection.     Return in about 8 months (around 06/15/2021) for TBSE.  I, Ashok Cordia, CMA, am acting as scribe for Sarina Ser, MD .  Documentation: I have reviewed the above documentation for accuracy and completeness, and I agree with the above.  Sarina Ser, MD

## 2020-10-15 NOTE — Patient Instructions (Signed)
[facility]: Dr. Wohl [phone] °

## 2020-10-15 NOTE — Progress Notes (Signed)
Name: Christina Salas   MRN: [id number]    DOB: [birth year]   Date:10/15/2020       Progress Note  Subjective  Chief Complaint  Follow up   HPI   Chronic cystitis: she was seen by Dr. Junious Silk , she states she has been doing well, she takes Macrobid . She takes medication when she has urinary frequency.  She had hematuria on urinalysis, negative culture had a cystoscopy and US kidney, she has a left kidney lower pole parapelvic cysts.   Cervical Radiculopathy: she  states pain has improved with baclofen prn, still feels stiff at times but pain is not as intense, and occasionally tingling down to right shoulder.She will let us know when she decides to see Dr. Vertell Limber at [facility] ( she has never seen him but her niece had neck surgery done by him) She takes baclofen prn only and seems to help with symptoms. She states she has a copper infused shirt and it seems to help a little. We gave her lyrica in the past but she never tried   Tension headache: she is taking fioricet prn and needs a refill, she describes headaches as dull and or sharp, not as frequent now, she states baclofen also helps when she has a headache. Usually frontal, very seldom she has associated  nausea or vomitingShe still has Fioricet at home and does not need refills today   Iron deficiency: last HCT improved but ferritin still down to 9 but no anemia She was seen by Dr. Durwin Reges back in 11/2018, had EGD and colonoscopy but did not have capsule endoscopy. She is not interested in going back at this time . She has lost weight and is taking ferrous sulfate   Osteopenia: she refuses to go back on Alendronate, she is worried about side effects. Discussed high calcium diet and vitamin D 2000 units daily . Unchanged   Mild Depression: she has long history of depression, she states not feeling great right now, states life is boring, denies suicidal thoughts or ideation, but has lack of energy and also lack of appetite ,  change in appetite and phq 9 is 6  today.   Pre-diabetes: she denies polyphagia or polyuria, she feels her mouth gets dry at times. Last A1C was 6.3%, we will recheck labs today    GERD: controlled with Nexium at this time, she is aware of long term side effects. She asked me to change rx to 90 pills   Malnutrition: she has lost almost 20 lbs since last August. Explained importance of seeing Gastroenterologist to have capsule endoscopy . She states she always had episodes of constipation and diarrhea. No change in bowel movements, she has noticed decrease in appetite.   Angina: she sees Dr. Clayborn Bigness, she states only occasionally has left side chest pain  Echo from 2017  [place] EF = >55 %  NORMAL RIGHT VENTRICULAR SYSTOLIC FUNCTION  MILD TRICUSPID AND MITRAL VALVE INSUFFICIENCY  TRACE AORTIC VALVE INSUFFICIENCY  NO VALVULAR STENOSIS  MILD BIATRIAL ENLARGEMENT   ECG Interpretation: 2017  Rest WUJ:WJXBJY sinus rhythm, none  Stress NWG:NFAOZH sinus rhythm,  Recovery YQM:VHQION sinus rhythm  ECG Interpretation:non-diagnostic due to pharmacologic testing.    Patient Active Problem List   Diagnosis Date Noted  . Mild depression ([place]) 01/24/2019  . Large hiatal hernia 01/24/2019  . Internal hemorrhoids 01/24/2019  . Iron deficiency anemia due to chronic blood loss   . Venous reflux 04/19/2018  .  Swelling of limb 04/27/2017  . Prediabetes 06/08/2016  . Obesity (BMI 30.0-34.9) 06/08/2016  . Vitamin D deficiency 06/08/2016  . Post-menopausal 12/11/2015  . DD (diverticular disease) 06/19/2015  . Edema extremities 06/19/2015  . Esophageal reflux 06/19/2015  . HLD (hyperlipidemia) 06/19/2015  . Varicose veins of leg with swelling, bilateral 06/19/2015  . Recurrent UTI 02/08/2013  . Mixed incontinence 02/08/2013  . History of breast cancer, left, T1, N0, Left MRM - Dec 1996 03/18/2012  . Tension headache 05/24/2007  .  History of melanoma 08/28/1994    Past Surgical History:  Procedure Laterality Date  . ABDOMINAL HYSTERECTOMY  2001  . BREAST BIOPSY  2010  . BREAST EXCISIONAL BIOPSY Right 1997   Benign excisional biopsy   . COLONOSCOPY  04/02/2014  . COLONOSCOPY WITH PROPOFOL N/A 12/27/2018   Procedure: COLONOSCOPY WITH PROPOFOL;  Surgeon: Lucilla Lame, MD;  Location: [facility] ENDOSCOPY;  Service: Endoscopy;  Laterality: N/A;  . CYSTOSCOPY  11/2014  . ESOPHAGOGASTRODUODENOSCOPY (EGD) WITH PROPOFOL N/A 12/27/2018   Procedure: ESOPHAGOGASTRODUODENOSCOPY (EGD) WITH PROPOFOL;  Surgeon: Lucilla Lame, MD;  Location: [facility] ENDOSCOPY;  Service: Endoscopy;  Laterality: N/A;  . MASTECTOMY  1996   Left  . MELANOMA EXCISION  1995   Head    Family History  Problem Relation Age of Onset  . Breast cancer Mother   . Migraines Mother   . Diabetes Father   . CAD Father   . Thrombosis Father   . Biliary Cirrhosis Brother   . Breast cancer Other        Niece    Social History   Tobacco Use  . Smoking status: Never Smoker  . Smokeless tobacco: Never Used  Substance Use Topics  . Alcohol use: No    Alcohol/week: 0.0 standard drinks     Current Outpatient Medications:  .  baclofen (LIORESAL) 10 MG tablet, Take 1 tablet (10 mg total) by mouth 3 (three) times daily as needed for muscle spasms., Disp: 30 each, Rfl: 0 .  butalbital-acetaminophen-caffeine (FIORICET) 50-325-40 MG tablet, Take 1 tablet by mouth every 6 (six) hours as needed (as needed)., Disp: 20 tablet, Rfl: 0 .  Calcium-Magnesium 500-250 MG TABS, Take 1 tablet by mouth 2 (two) times daily., Disp: , Rfl:  .  CINNAMON PO, Take 1 capsule by mouth daily. 350 mg, Disp: , Rfl:  .  Cranberry ([employer] CRANBERRY) 500 MG CAPS, Take 1 tablet by mouth daily., Disp: , Rfl:  .  esomeprazole (NEXIUM) 20 MG capsule, Take 1 capsule (20 mg total) by mouth daily., Disp: 90 capsule, Rfl: 1 .  Misc Natural Products (LEG VEIN & CIRCULATION) TABS, Take 2 capsules by mouth  daily., Disp: , Rfl:  .  Multiple Vitamins-Calcium (ESSENTIAL ONE DAILY MULTIVIT) TABS, Take by mouth., Disp: , Rfl:  .  mupirocin ointment (BACTROBAN) 2 %, Apply 1 application topically daily. Qd to excision site, Disp: 22 g, Rfl: 0 .  Omega-3 Fatty Acids (FISH OIL) 1000 MG CAPS, Take by mouth., Disp: , Rfl:   No Known Allergies  I personally reviewed active problem list, medication list, allergies, family history, social history, health maintenance with the patient/caregiver today.   ROS  Constitutional: Negative for fever , positive for  weight change.  Respiratory: Negative for cough and shortness of breath.   Cardiovascular: Negative for chest pain or palpitations.  Gastrointestinal: Negative for abdominal pain, no bowel changes.  Musculoskeletal: positive  for gait problem but no  joint swelling.  Skin: Negative for rash.  Neurological:  Negative for dizziness, positive for  headache.  No other specific complaints in a complete review of systems (except as listed in HPI above).  Objective  Vitals:   10/15/20 1327  BP: (!) 148/80  Pulse: 86  Resp: 18  Temp: 97.8 F (36.6 C)  TempSrc: Oral  SpO2: 94%  Weight: 158 lb 14.4 oz (72.1 kg)  Height: 5\' 1"  (1.549 m)    Body mass index is 30.02 kg/m.  Physical Exam  Constitutional: Patient appears well-developed and well-nourished. ObeseNo distress.  HEENT: head atraumatic, normocephalic, pupils equal and reactive to light, eneck supple, Cardiovascular: Normal rate, regular rhythm and normal heart sounds.  No murmur heard. trace  BLE edema. Pulmonary/Chest: Effort normal and breath sounds normal. No respiratory distress. Abdominal: Soft.  There is no tenderness. Psychiatric: Patient has a normal mood and affect. behavior is normal. Judgment and thought content normal.  Recent Results (from the past 2160 hour(s))  Urinalysis, Complete     Status: Abnormal   Collection Time: 09/13/20 10:14 AM  Result Value Ref Range    Specific Gravity, UA >1.030 (H) 1.005 - 1.030   pH, UA 5.5 5.0 - 7.5   Color, UA Orange Yellow   Appearance Ur Cloudy (A) Clear   Leukocytes,UA Negative Negative   Protein,UA 1+ (A) Negative/Trace   Glucose, UA Negative Negative   Ketones, UA Trace (A) Negative   RBC, UA Trace (A) Negative   Bilirubin, UA Negative Negative   Urobilinogen, Ur 0.2 0.2 - 1.0 mg/dL   Nitrite, UA Negative Negative   Microscopic Examination See below:     Comment: Results from sub-optimal specimen performed at the client's request. Interpret result(s) with caution. Microscopic exam performed on unspun urine. Reference range(s) not established for this type of specimen.   Microscopic Examination     Status: Abnormal   Collection Time: 09/13/20 10:14 AM   Urine  Result Value Ref Range   WBC, UA 0-5 0 - 5 /hpf   RBC 3-10 (A) 0 - 2 /hpf   Epithelial Cells (non renal) 0-10 0 - 10 /hpf   Bacteria, UA Few None seen/Few  CULTURE, URINE COMPREHENSIVE     Status: None   Collection Time: 09/13/20 11:44 AM   Specimen: Urine   UR  Result Value Ref Range   Urine Culture, Comprehensive Final report    Organism ID, Bacteria Comment     Comment: No growth in 36 - 48 hours.  Urinalysis, Complete     Status: Abnormal   Collection Time: 09/27/20  9:14 AM  Result Value Ref Range   Specific Gravity, UA >1.030 (H) 1.005 - 1.030   pH, UA 5.0 5.0 - 7.5   Color, UA Yellow Yellow   Appearance Ur Clear Clear   Leukocytes,UA Negative Negative   Protein,UA Negative Negative/Trace   Glucose, UA Negative Negative   Ketones, UA Negative Negative   RBC, UA Trace (A) Negative   Bilirubin, UA Negative Negative   Urobilinogen, Ur 0.2 0.2 - 1.0 mg/dL   Nitrite, UA Negative Negative   Microscopic Examination See below:   Microscopic Examination     Status: Abnormal   Collection Time: 09/27/20  9:14 AM   Urine  Result Value Ref Range   WBC, UA 0-5 0 - 5 /hpf   RBC None seen 0 - 2 /hpf   Epithelial Cells (non renal) 0-10 0  - 10 /hpf   Crystals Present (A) N/A   Crystal Type Calcium Oxalate N/A   Bacteria,  UA None seen None seen/Few     PHQ2/9: Depression screen [facility] 2/9 10/15/2020 04/15/2020 01/30/2020 12/15/2019 08/18/2019  Decreased Interest 1 1 0 1 1  Down, Depressed, Hopeless 1 1 0 1 1  PHQ - 2 Score 2 2 0 2 2  Altered sleeping 1 1 - 0 1  Tired, decreased energy 1 1 - 1 2  Change in appetite 1 0 - 0 1  Feeling bad or failure about yourself  1 1 - 0 1  Trouble concentrating 0 0 - 1 0  Moving slowly or fidgety/restless 0 0 - 0 2  Suicidal thoughts 0 0 - 0 -  PHQ-9 Score 6 5 - 4 9  Difficult doing work/chores Somewhat difficult Somewhat difficult - - Somewhat difficult  Some recent data might be hidden    phq 9 is positive   Fall Risk: Fall Risk  10/15/2020 04/15/2020 01/30/2020 12/15/2019 08/18/2019  Falls in the past year? 0 0 0 0 0  Number falls in past yr: 0 0 0 0 0  Injury with Fall? 0 0 0 0 0  Risk for fall due to : - - No Fall Risks - -  Follow up - - Falls prevention discussed - -      Functional Status Survey: Is the patient deaf or have difficulty hearing?: Yes Does the patient have difficulty seeing, even when wearing glasses/contacts?: No Does the patient have difficulty concentrating, remembering, or making decisions?: Yes Does the patient have difficulty walking or climbing stairs?: Yes Does the patient have difficulty dressing or bathing?: No Does the patient have difficulty doing errands alone such as visiting a doctor's office or shopping?: No    Assessment & Plan  1. Mild depression ([place])  Not interested in medication   2. Hyperglycemia  - Hemoglobin A1c  3. Need for immunization against influenza  - Flu Vaccine QUAD High Dose(Fluad)  4. Angina at rest [facility])  Keep follow up with cardiologist   5. Tension headache  - butalbital-acetaminophen-caffeine (FIORICET) 50-325-40 MG tablet; Take 1 tablet by mouth every 6 (six) hours as needed (as needed).  Dispense: 20  tablet; Refill: 0  6. Hypertriglyceridemia  - Lipid panel  7. Vitamin D deficiency   8. Prediabetes  Recheck labs  9. Gastroesophageal reflux disease without esophagitis   10. Muscle spasms of neck  She would like refill of baclofen   11. Cervical radiculopathy  - baclofen (LIORESAL) 10 MG tablet; Take 1 tablet (10 mg total) by mouth 3 (three) times daily as needed for muscle spasms.  Dispense: 90 each; Refill: 0  12. Osteopenia after menopause   13. Iron deficiency  - CBC with Differential/Platelet - Iron, TIBC and Ferritin Panel  14. Large hiatal hernia   15. Mild protein-calorie malnutrition ([place])  Discussed increase in calorie intake, and to follow up with GI to due capsule endoscopy   16. Weight loss, abnormal  - COMPLETE METABOLIC PANEL WITH GFR - TSH

## 2020-10-22 DIAGNOSIS — E611 Iron deficiency: Secondary | ICD-10-CM | POA: Diagnosis not present

## 2020-10-22 DIAGNOSIS — R739 Hyperglycemia, unspecified: Secondary | ICD-10-CM | POA: Diagnosis not present

## 2020-10-22 DIAGNOSIS — E781 Pure hyperglyceridemia: Secondary | ICD-10-CM | POA: Diagnosis not present

## 2020-10-22 DIAGNOSIS — R634 Abnormal weight loss: Secondary | ICD-10-CM | POA: Diagnosis not present

## 2020-10-23 LAB — COMPLETE METABOLIC PANEL WITH GFR
AG Ratio: 1.5 (calc) (ref 1.0–2.5)
ALT: 11 U/L (ref 6–29)
AST: 16 U/L (ref 10–35)
Albumin: 4.4 g/dL (ref 3.6–5.1)
Alkaline phosphatase (APISO): 79 U/L (ref 37–153)
BUN: 25 mg/dL (ref 7–25)
CO2: 27 mmol/L (ref 20–32)
Calcium: 9.9 mg/dL (ref 8.6–10.4)
Chloride: 104 mmol/L (ref 98–110)
Creat: 0.75 mg/dL (ref 0.60–0.88)
GFR, Est African American: 85 mL/min/{1.73_m2} (ref >=60)
GFR, Est Non African American: 73 mL/min/{1.73_m2} (ref >=60)
Globulin: 3 g/dL (calc) (ref 1.9–3.7)
Glucose, Bld: 99 mg/dL (ref 65–99)
Potassium: 4 mmol/L (ref 3.5–5.3)
Sodium: 141 mmol/L (ref 135–146)
Total Bilirubin: 0.3 mg/dL (ref 0.2–1.2)
Total Protein: 7.4 g/dL (ref 6.1–8.1)

## 2020-10-23 LAB — CBC WITH DIFFERENTIAL/PLATELET
Absolute Monocytes: 458 cells/uL (ref 200–950)
Basophils Absolute: 24 cells/uL (ref 0–200)
Basophils Relative: 0.3 %
Eosinophils Absolute: 111 cells/uL (ref 15–500)
Eosinophils Relative: 1.4 %
HCT: 42.2 % (ref 35.0–45.0)
Hemoglobin: 13.7 g/dL (ref 11.7–15.5)
Lymphs Abs: 3800 cells/uL (ref 850–3900)
MCH: 28 pg (ref 27.0–33.0)
MCHC: 32.5 g/dL (ref 32.0–36.0)
MCV: 86.1 fL (ref 80.0–100.0)
MPV: 10.8 fL (ref 7.5–12.5)
Monocytes Relative: 5.8 %
Neutro Abs: 3508 cells/uL (ref 1500–7800)
Neutrophils Relative %: 44.4 %
Platelets: 210 10*3/uL (ref 140–400)
RBC: 4.9 10*6/uL (ref 3.80–5.10)
RDW: 14.5 % (ref 11.0–15.0)
Total Lymphocyte: 48.1 %
WBC: 7.9 10*3/uL (ref 3.8–10.8)

## 2020-10-23 LAB — LIPID PANEL
Cholesterol: 181 mg/dL (ref <200)
HDL: 52 mg/dL (ref >=50)
LDL Cholesterol (Calc): 94 mg/dL (calc)
Non-HDL Cholesterol (Calc): 129 mg/dL (calc) (ref <130)
Total CHOL/HDL Ratio: 3.5 (calc) (ref <5.0)
Triglycerides: 268 mg/dL — ABNORMAL HIGH (ref <150)

## 2020-10-23 LAB — IRON,TIBC AND FERRITIN PANEL
%SAT: 10 % — ABNORMAL LOW (ref 16–45)
Ferritin: 11 ng/mL — ABNORMAL LOW (ref 16–288)
Iron: 51 ug/dL (ref 45–160)
TIBC: 493 ug/dL — ABNORMAL HIGH (ref 250–450)

## 2020-10-23 LAB — HEMOGLOBIN A1C
Hgb A1c MFr Bld: 5.8 % of total Hgb — ABNORMAL HIGH (ref <5.7)
Mean Plasma Glucose: 120 (calc)
eAG (mmol/L): 6.6 (calc)

## 2020-10-23 LAB — TSH: TSH: 2.52 m[IU]/L (ref 0.40–4.50)

## 2020-10-28 ENCOUNTER — Ambulatory Visit: Payer: Medicare PPO | Attending: Internal Medicine

## 2020-10-28 DIAGNOSIS — Z23 Encounter for immunization: Secondary | ICD-10-CM

## 2020-10-28 NOTE — Progress Notes (Signed)
° °  Covid-19 Vaccination Clinic  Name:  ARLISA LECLERE    MRN: [id number] DOB: 17-Jul-1933  10/28/2020  Ms. Rankin was observed post Covid-19 immunization for 15 minutes without incident. She was provided with Vaccine Information Sheet and instruction to access the V-Safe system.   Ms. Cousineau was instructed to call 911 with any severe reactions post vaccine:  Difficulty breathing   Swelling of face and throat   A fast heartbeat   A bad rash all over body   Dizziness and weakness

## 2020-10-29 DIAGNOSIS — H2513 Age-related nuclear cataract, bilateral: Secondary | ICD-10-CM | POA: Diagnosis not present

## 2020-10-29 DIAGNOSIS — H02403 Unspecified ptosis of bilateral eyelids: Secondary | ICD-10-CM | POA: Diagnosis not present

## 2020-10-29 DIAGNOSIS — H52203 Unspecified astigmatism, bilateral: Secondary | ICD-10-CM | POA: Diagnosis not present

## 2020-11-28 ENCOUNTER — Other Ambulatory Visit: Payer: Self-pay

## 2020-11-28 ENCOUNTER — Encounter: Payer: Self-pay | Admitting: Gastroenterology

## 2020-11-28 ENCOUNTER — Ambulatory Visit: Payer: Medicare PPO | Admitting: Gastroenterology

## 2020-11-28 VITALS — BP 116/68 | HR 97 | Ht 61.0 in | Wt 157.0 lb

## 2020-11-28 DIAGNOSIS — E611 Iron deficiency: Secondary | ICD-10-CM

## 2020-11-28 DIAGNOSIS — R1310 Dysphagia, unspecified: Secondary | ICD-10-CM | POA: Diagnosis not present

## 2020-11-28 NOTE — Progress Notes (Signed)
Primary Care Physician: Steele Sizer, MD  Primary Gastroenterologist:  Dr. Lucilla Lame  Chief Complaint  Patient presents with   Iron deficiency anemia    HPI: Christina Salas is a 86 y.o. female here for follow-up of iron deficiency anemia.  The patient was found to have iron deficiency anemia back in 2019 and underwent an EGD and colonoscopy.  At that time the EGD showed a large hiatal hernia but no source of the anemia was seen on either the upper endoscopy or colonoscopy.  The patient was recommended to undergo a capsule endoscopy which had not been done.  The patient's most recent labs showed her iron levels to be low but her hemoglobin was normal at 13.7.  The patient's iron saturation was 10% with a ferritin of 11. She lost 15 lbs in the last year.  The patient reports that she has some intermittent dysphagia.  She denies any nausea vomiting fevers or chills.  Past Medical History:  Diagnosis Date   Abnormal glucose    Anxiety    Arthritis    Breast cancer ([place]) 1996   Left- mastectomy   Cancer ([place])    melanoma   Chronic headaches    Depression    Dysplastic nevus 05/11/2007   right upper gastric area. Slight to moderate atypia.   Dysplastic nevus 06/24/2020   R lateral deltoid, moderate to severe atypia. Excised 08/13/2020, margins free.   GERD (gastroesophageal reflux disease)    H/O melanoma excision    History of diverticulitis    History of recurrent UTIs    Melanoma ([place])    scalp   Osteoporosis    pre-osetoporosis per patient history form 03/18/12   Pre-diabetes    Vitamin D deficiency     Current Outpatient Medications  Medication Sig Dispense Refill   baclofen (LIORESAL) 10 MG tablet Take 1 tablet (10 mg total) by mouth 3 (three) times daily as needed for muscle spasms. 90 each 0   butalbital-acetaminophen-caffeine (FIORICET) 50-325-40 MG tablet Take 1 tablet by mouth every 6 (six) hours as needed (as needed). 20 tablet 0    Calcium-Magnesium 500-250 MG TABS Take 1 tablet by mouth 2 (two) times daily.     CINNAMON PO Take 1 capsule by mouth daily. 350 mg     Cranberry ([employer] CRANBERRY) 500 MG CAPS Take 1 tablet by mouth daily.     esomeprazole (NEXIUM) 20 MG capsule Take 1 capsule (20 mg total) by mouth daily. 90 capsule 1   Misc Natural Products (LEG VEIN & CIRCULATION) TABS Take 2 capsules by mouth daily.     Multiple Vitamins-Calcium (ESSENTIAL ONE DAILY MULTIVIT) TABS Take by mouth.     mupirocin ointment (BACTROBAN) 2 % Apply 1 application topically daily. Qd to excision site 22 g 0   Omega-3 Fatty Acids (FISH OIL) 1000 MG CAPS Take by mouth.     No current facility-administered medications for this visit.    Allergies as of 11/28/2020   (No Known Allergies)    ROS:  General: Negative for anorexia, weight loss, fever, chills, fatigue, weakness. ENT: Negative for hoarseness, difficulty swallowing , nasal congestion. CV: Negative for chest pain, angina, palpitations, dyspnea on exertion, peripheral edema.  Respiratory: Negative for dyspnea at rest, dyspnea on exertion, cough, sputum, wheezing.  GI: See history of present illness. GU:  Negative for dysuria, hematuria, urinary incontinence, urinary frequency, nocturnal urination.  Endo: Negative for unusual weight change.    Physical Examination:   BP 116/68  Pulse 97    Ht 5\' 1"  (1.549 m)    Wt 157 lb (71.2 kg)    BMI 29.66 kg/m   General: Well-nourished, well-developed in no acute distress.  Eyes: No icterus. Conjunctivae pink. Lungs: Clear to auscultation bilaterally. Non-labored. Heart: Regular rate and rhythm, no murmurs rubs or gallops.  Abdomen: Bowel sounds are normal, nontender, nondistended, no hepatosplenomegaly or masses, no abdominal bruits or hernia , no rebound or guarding.   Extremities: No lower extremity edema. No clubbing or deformities. Neuro: Alert and oriented x 3.  Grossly intact. Skin: Warm and dry, no jaundice.     Psych: Alert and cooperative, normal mood and affect.  Labs:    Imaging Studies: No results found.  Assessment and Plan:   Christina Salas is a 86 y.o. y/o female who comes in today with a history of having an EGD and colonoscopy for iron deficiency anemia back in 2019.  The patient states that she has lost approximately 15 pounds over the last year.  The patient never had her capsule endoscopy done for evaluation of her anemia.  The patient will be set up for the capsule endoscopy and will also be set up for a barium swallow to see if there is any cause for her trouble swallowing.  The patient has been explained the plan and agrees with it.     Lucilla Lame, MD. Marval Regal    Note: This dictation was prepared with Dragon dictation along with smaller phrase technology. Any transcriptional errors that result from this process are unintentional.

## 2020-12-06 ENCOUNTER — Telehealth: Payer: Self-pay | Admitting: Gastroenterology

## 2020-12-06 NOTE — Telephone Encounter (Signed)
Returned pt's call and rescheduled upper Gi series appt to 12/16/20.

## 2020-12-06 NOTE — Telephone Encounter (Signed)
Pt needs to change procedure appt.

## 2020-12-10 ENCOUNTER — Ambulatory Visit: Payer: Medicare PPO

## 2020-12-16 ENCOUNTER — Ambulatory Visit
Admission: RE | Admit: 2020-12-16 | Discharge: 2020-12-16 | Disposition: A | Discharge status: Home / Self Care | Payer: Medicare PPO | Source: Ambulatory Visit | Attending: Gastroenterology | Admitting: Gastroenterology

## 2020-12-16 ENCOUNTER — Other Ambulatory Visit: Payer: Self-pay

## 2020-12-16 DIAGNOSIS — K220 Achalasia of cardia: Secondary | ICD-10-CM | POA: Diagnosis not present

## 2020-12-16 DIAGNOSIS — R1310 Dysphagia, unspecified: Secondary | ICD-10-CM

## 2021-01-02 ENCOUNTER — Telehealth: Payer: Self-pay | Admitting: Gastroenterology

## 2021-01-02 NOTE — Telephone Encounter (Signed)
Can I change her capsule study to an EGD with Capsule?

## 2021-01-02 NOTE — Telephone Encounter (Signed)
The capsule study is done with her sitting up in swallowing capsule while the upper endoscopy is done while she is asleep.  They cannot really be done at the exact same time.  Why are we wanting to add an upper endoscopy for this patient?

## 2021-01-02 NOTE — Telephone Encounter (Signed)
Pt called to ask if Dr Servando Snare can do EGD at same time of scheduled procedure 01/07/21.  Please call to advise

## 2021-01-03 ENCOUNTER — Other Ambulatory Visit
Admission: RE | Admit: 2021-01-03 | Discharge: 2021-01-03 | Disposition: A | Discharge status: Home / Self Care | Payer: Medicare PPO | Source: Ambulatory Visit | Attending: Gastroenterology | Admitting: Gastroenterology

## 2021-01-03 DIAGNOSIS — Z01812 Encounter for preprocedural laboratory examination: Secondary | ICD-10-CM | POA: Diagnosis not present

## 2021-01-03 DIAGNOSIS — Z20822 Contact with and (suspected) exposure to covid-19: Secondary | ICD-10-CM | POA: Insufficient documentation

## 2021-01-04 LAB — SARS CORONAVIRUS 2 (TAT 6-24 HRS): SARS Coronavirus 2: NEGATIVE

## 2021-01-07 ENCOUNTER — Encounter: Payer: Self-pay | Admitting: Gastroenterology

## 2021-01-07 ENCOUNTER — Encounter
Admission: RE | Disposition: A | Discharge status: Home / Self Care | Payer: Self-pay | Source: Home / Self Care | Attending: Gastroenterology

## 2021-01-07 ENCOUNTER — Other Ambulatory Visit: Payer: Self-pay

## 2021-01-07 ENCOUNTER — Ambulatory Visit
Admission: RE | Admit: 2021-01-07 | Discharge: 2021-01-07 | Disposition: A | Discharge status: Home / Self Care | Payer: Medicare PPO | Attending: Gastroenterology | Admitting: Gastroenterology

## 2021-01-07 ENCOUNTER — Ambulatory Visit: Payer: Medicare PPO | Admitting: Certified Registered"

## 2021-01-07 DIAGNOSIS — K222 Esophageal obstruction: Secondary | ICD-10-CM | POA: Diagnosis not present

## 2021-01-07 DIAGNOSIS — Z79899 Other long term (current) drug therapy: Secondary | ICD-10-CM | POA: Diagnosis not present

## 2021-01-07 DIAGNOSIS — D500 Iron deficiency anemia secondary to blood loss (chronic): Secondary | ICD-10-CM

## 2021-01-07 DIAGNOSIS — K449 Diaphragmatic hernia without obstruction or gangrene: Secondary | ICD-10-CM | POA: Insufficient documentation

## 2021-01-07 DIAGNOSIS — D509 Iron deficiency anemia, unspecified: Secondary | ICD-10-CM | POA: Diagnosis not present

## 2021-01-07 DIAGNOSIS — E611 Iron deficiency: Secondary | ICD-10-CM

## 2021-01-07 HISTORY — PX: ESOPHAGOGASTRODUODENOSCOPY (EGD) WITH PROPOFOL: SHX5813

## 2021-01-07 HISTORY — PX: GIVENS CAPSULE STUDY: SHX5432

## 2021-01-07 HISTORY — DX: Essential (primary) hypertension: I10

## 2021-01-07 SURGERY — ESOPHAGOGASTRODUODENOSCOPY (EGD) WITH PROPOFOL
Anesthesia: General

## 2021-01-07 MED ORDER — PROPOFOL 10 MG/ML IV BOLUS
INTRAVENOUS | Status: DC | PRN
  Administered 2021-01-07: 75 ug/kg/min via INTRAVENOUS

## 2021-01-07 MED ORDER — ONDANSETRON HCL 4 MG/2ML IJ SOLN
INTRAMUSCULAR | Status: DC | PRN
  Administered 2021-01-07: 4 mg via INTRAVENOUS

## 2021-01-07 MED ORDER — PROPOFOL 500 MG/50ML IV EMUL
INTRAVENOUS | Status: AC
  Filled 2021-01-07: qty 50

## 2021-01-07 MED ORDER — LIDOCAINE HCL (CARDIAC) PF 100 MG/5ML IV SOSY
PREFILLED_SYRINGE | INTRAVENOUS | Status: DC | PRN
  Administered 2021-01-07: 60 mg via INTRAVENOUS

## 2021-01-07 MED ORDER — PROPOFOL 500 MG/50ML IV EMUL
INTRAVENOUS | Status: DC | PRN
  Administered 2021-01-07 (×3): 10 mg via INTRAVENOUS

## 2021-01-07 MED ORDER — SODIUM CHLORIDE 0.9 % IV SOLN
INTRAVENOUS | Status: DC
  Administered 2021-01-07: 1000 mL via INTRAVENOUS

## 2021-01-07 NOTE — Transfer of Care (Signed)
Immediate Anesthesia Transfer of Care Note  Patient: Christina Salas  Procedure(s) Performed: ESOPHAGOGASTRODUODENOSCOPY (EGD) WITH PROPOFOL (N/A ) GIVENS CAPSULE STUDY (N/A )  Patient Location: PACU and Endoscopy Unit  Anesthesia Type:General  Level of Consciousness: drowsy and patient cooperative  Airway & Oxygen Therapy: Patient Spontanous Breathing and Patient connected to nasal cannula oxygen  Post-op Assessment: Report given to RN and Post -op Vital signs reviewed and stable  Post vital signs: Reviewed and stable  Last Vitals:  Vitals Value Taken Time  BP 144/60 01/07/21 0850  Temp    Pulse 91 01/07/21 0850  Resp 18 01/07/21 0850  SpO2 93 % 01/07/21 0850  Vitals shown include unvalidated device data.  Last Pain:  Vitals:   01/07/21 0722  TempSrc: Temporal  PainSc: 0-No pain         Complications: No complications documented.

## 2021-01-07 NOTE — Anesthesia Postprocedure Evaluation (Signed)
Anesthesia Post Note  Patient: Christina Salas  Procedure(s) Performed: ESOPHAGOGASTRODUODENOSCOPY (EGD) WITH PROPOFOL (N/A ) GIVENS CAPSULE STUDY (N/A )  Patient location during evaluation: Endoscopy Anesthesia Type: General Level of consciousness: awake and alert Pain management: pain level controlled Vital Signs Assessment: post-procedure vital signs reviewed and stable Respiratory status: spontaneous breathing and respiratory function stable Cardiovascular status: stable Anesthetic complications: no   No complications documented.   Last Vitals:  Vitals:   01/07/21 0849 01/07/21 0859  BP:  126/76  Pulse:    Resp:    Temp: 36.5 C   SpO2:      Last Pain:  Vitals:   01/07/21 0919  TempSrc:   PainSc: 0-No pain                 Chace Klippel K

## 2021-01-07 NOTE — Anesthesia Preprocedure Evaluation (Signed)
Anesthesia Evaluation  Patient identified by MRN, date of birth, ID band Patient awake    Reviewed: Allergy & Precautions, NPO status , Patient's Chart, lab work & pertinent test results  History of Anesthesia Complications Negative for: history of anesthetic complications  Airway Mallampati: III       Dental   Pulmonary neg sleep apnea, neg COPD, Not current smoker,           Cardiovascular (-) hypertension(-) Past MI and (-) CHF (-) dysrhythmias (-) Valvular Problems/Murmurs     Neuro/Psych neg Seizures Anxiety Depression    GI/Hepatic Neg liver ROS, hiatal hernia, GERD  Medicated,  Endo/Other  neg diabetes  Renal/GU negative Renal ROS     Musculoskeletal   Abdominal   Peds  Hematology  (+) anemia ,   Anesthesia Other Findings   Reproductive/Obstetrics                             Anesthesia Physical Anesthesia Plan  ASA: II  Anesthesia Plan: General   Post-op Pain Management:    Induction: Intravenous  PONV Risk Score and Plan: 3 and Propofol infusion and TIVA  Airway Management Planned: Nasal Cannula  Additional Equipment:   Intra-op Plan:   Post-operative Plan:   Informed Consent: I have reviewed the patients History and Physical, chart, labs and discussed the procedure including the risks, benefits and alternatives for the proposed anesthesia with the patient or authorized representative who has indicated his/her understanding and acceptance.       Plan Discussed with:   Anesthesia Plan Comments:         Anesthesia Quick Evaluation

## 2021-01-07 NOTE — H&P (Signed)
Christina Lame, MD [address] [place],  [address] [phone] Fax : [phone]  Primary Care Physician:  Steele Sizer, MD Primary Gastroenterologist:  Dr. Allen Norris  Pre-Procedure History & Physical: HPI:  Christina Salas is a 87 y.o. female is here for an endoscopy.   Past Medical History:  Diagnosis Date  . Abnormal glucose   . Anxiety   . Arthritis   . Breast cancer ([place]) 1996   Left- mastectomy  . Cancer ([place])    melanoma  . Chronic headaches   . Depression   . Dysplastic nevus 05/11/2007   right upper gastric area. Slight to moderate atypia.  [name] Dysplastic nevus 06/24/2020   R lateral deltoid, moderate to severe atypia. Excised 08/13/2020, margins free.  [name] GERD (gastroesophageal reflux disease)   . H/O melanoma excision   . History of diverticulitis   . History of recurrent UTIs   . Melanoma ([place])    scalp  . Osteoporosis    pre-osetoporosis per patient history form 03/18/12  . Pre-diabetes   . Vitamin D deficiency     Past Surgical History:  Procedure Laterality Date  . ABDOMINAL HYSTERECTOMY  2001  . BREAST BIOPSY  2010  . BREAST EXCISIONAL BIOPSY Right 1997   Benign excisional biopsy   . COLONOSCOPY  04/02/2014  . COLONOSCOPY WITH PROPOFOL N/A 12/27/2018   Procedure: COLONOSCOPY WITH PROPOFOL;  Surgeon: Christina Lame, MD;  Location: [facility] ENDOSCOPY;  Service: Endoscopy;  Laterality: N/A;  . CYSTOSCOPY  11/2014  . ESOPHAGOGASTRODUODENOSCOPY (EGD) WITH PROPOFOL N/A 12/27/2018   Procedure: ESOPHAGOGASTRODUODENOSCOPY (EGD) WITH PROPOFOL;  Surgeon: Christina Lame, MD;  Location: [facility] ENDOSCOPY;  Service: Endoscopy;  Laterality: N/A;  . MASTECTOMY  1996   Left  . [place]    Prior to Admission medications   Medication Sig Start Date End Date Taking? Authorizing Provider  baclofen (LIORESAL) 10 MG tablet Take 1 tablet (10 mg total) by mouth 3 (three) times daily as needed for muscle spasms. 10/15/20   Steele Sizer, MD  butalbital-acetaminophen-caffeine (FIORICET) [phone] MG tablet Take 1 tablet by mouth every 6 (six) hours as needed (as needed). 10/15/20   Steele Sizer, MD  Calcium-Magnesium 500-250 MG TABS Take 1 tablet by mouth 2 (two) times daily.    [provider]  CINNAMON PO Take 1 capsule by mouth daily. 350 mg    [provider]  Cranberry ([facility] CRANBERRY) 500 MG CAPS Take 1 tablet by mouth daily.    [provider]  esomeprazole (NEXIUM) 20 MG capsule Take 1 capsule (20 mg total) by mouth daily. 09/20/20   Steele Sizer, MD  Misc Natural Products (LEG VEIN & CIRCULATION) TABS Take 2 capsules by mouth daily.    [provider]  Multiple Vitamins-Calcium (ESSENTIAL ONE DAILY MULTIVIT) TABS Take by mouth.    [provider]  mupirocin ointment (BACTROBAN) 2 % Apply 1 application topically daily. Qd to excision site 08/06/20   Ralene Bathe, MD  Omega-3 Fatty Acids (FISH OIL) 1000 MG CAPS Take by mouth.    [provider]    Allergies as of 11/28/2020  . (No Known Allergies)    Family History  Problem Relation Age of Onset  . Breast cancer Mother   . Migraines Mother   . Diabetes Father   . CAD Father   . Thrombosis Father   . Biliary Cirrhosis Brother   . Breast cancer Other  Niece    Social History   Socioeconomic History  . Marital status: Single    Spouse name: Not on file  . Number of children: 0  . Years of education: Not on file  . Highest education level: Master's degree (e.g., MA, MS, MEng, MEd, MSW, MBA)  Occupational History  . Occupation: Retired  Tobacco Use  . Smoking status: Never Smoker  . Smokeless tobacco: Never Used  Vaping Use  . Vaping Use: Never used  Substance and Sexual Activity  . Alcohol use: No    Alcohol/week: 0.0 standard drinks  . Drug use: Never  . Sexual activity: Not Currently  Other Topics Concern  . 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 Difficulty of Paying Living Expenses: Not hard at all  Food Insecurity: No Food Insecurity  . Worried About Charity fundraiser in the Last Year: Never true  . Ran Out of Food in the Last Year: Never true  Transportation Needs: No Transportation Needs  . Lack of Transportation (Medical): No  . Lack of Transportation (Non-Medical): No  Physical Activity: Not on file  Stress: No Stress Concern Present  . Feeling of Stress : Only a little  Social Connections: Moderately Isolated  . Frequency of Communication with Friends and Family: More than three times a week  . Frequency of Social Gatherings with Friends and Family: Three times a week  . Attends Religious Services: More than 4 times per year  . Active Member of Clubs or Organizations: No  . Attends Archivist Meetings: Never  . Marital Status: Never married  Intimate Partner Violence: Not At Risk  . Fear of Current or Ex-Partner: No  . Emotionally Abused: No  . Physically Abused: No  . Sexually Abused: No    Review of Systems: See HPI, otherwise negative ROS  Physical Exam: There were no vitals taken for this visit. General:   Alert,  pleasant and cooperative in NAD Head:  Normocephalic and atraumatic. Neck:  Supple; no masses or thyromegaly. Lungs:  Clear throughout to auscultation.    Heart:  Regular rate and rhythm. Abdomen:  Soft, nontender and nondistended. Normal bowel sounds, without guarding, and without rebound.   Neurologic:  Alert and  oriented x4;  grossly normal neurologically.  Impression/Plan: [name] is here for an endoscopy to be performed for iron deficiency anemia and capsule placement  Risks, benefits, limitations, and alternatives regarding  endoscopy have been reviewed with the patient.  Questions have been  answered.  All parties agreeable.   Christina Lame, MD  01/07/2021, 7:16 AM

## 2021-01-07 NOTE — Op Note (Signed)
[facility] Gastroenterology Patient Name: Christina Salas Procedure Date: 01/06/2021 11:37 AM MRN: [id number] Account #: [id number] Date of Birth: 1933-03-13 Admit Type: Outpatient Age: 87 Room: [facility] ENDO ROOM 4 Gender: Female Note Status: Finalized Procedure:             Upper GI endoscopy Indications:           Iron deficiency anemia for capsule endoscopy placement Providers:             Lucilla Lame MD, MD Medicines:             Propofol per Anesthesia Procedure:             Pre-Anesthesia Assessment:                        - Prior to the procedure, a History and Physical was                         performed, and patient medications and allergies were                         reviewed. The patient's tolerance of previous                         anesthesia was also reviewed. The risks and benefits                         of the procedure and the sedation options and risks                         were discussed with the patient. All questions were                         answered, and informed consent was obtained. Prior                         Anticoagulants: The patient has taken no previous                         anticoagulant or antiplatelet agents. ASA Grade                         Assessment: II - A patient with mild systemic disease.                         After reviewing the risks and benefits, the patient                         was deemed in satisfactory condition to undergo the                         procedure.                        After obtaining informed consent, the endoscope was                         passed under direct vision. Throughout the procedure,  the patient's blood pressure, pulse, and oxygen                         saturations were monitored continuously. The Endoscope                         was introduced through the mouth, and advanced to the                         second part of duodenum. The upper GI  endoscopy was                         accomplished without difficulty. The patient tolerated                         the procedure well. Findings:      A large hiatal hernia was present.      One benign-appearing, intrinsic moderate stenosis was found at the       gastroesophageal junction. The stenosis was traversed.      The stomach was normal.      The examined duodenum was normal.      Using the endoscope, the video capsule enteroscope was advanced into the       duodenal bulb. Impression:            - Large hiatal hernia.                        - Benign-appearing esophageal stenosis.                        - Normal stomach.                        - Normal examined duodenum.                        - Successful completion of the Video Capsule                         Enteroscope placement.                        - No specimens collected. Recommendation:        - Discharge patient to home.                        - Resume previous diet.                        - Continue present medications. Procedure Code(s):     --- Professional ---                        [phone], Esophagogastroduodenoscopy, flexible,                         transoral; diagnostic, including collection of                         specimen(s) by brushing or washing, when performed                         (  separate procedure) Diagnosis Code(s):     --- Professional ---                        D50.9, Iron deficiency anemia, unspecified                        K22.2, Esophageal obstruction CPT copyright 2019 American Medical Association. All rights reserved. The codes documented in this report are preliminary and upon coder review may  be revised to meet current compliance requirements. Lucilla Lame MD, MD 01/07/2021 8:48:39 AM This report has been signed electronically. Number of Addenda: 0 Note Initiated On: 01/06/2021 11:37 AM      [facility]

## 2021-01-08 ENCOUNTER — Encounter: Payer: Self-pay | Admitting: Gastroenterology

## 2021-01-27 ENCOUNTER — Other Ambulatory Visit: Payer: Self-pay

## 2021-01-28 ENCOUNTER — Telehealth: Payer: Self-pay

## 2021-01-28 NOTE — Telephone Encounter (Signed)
-----   Message from Darren Wohl, MD sent at 01/27/2021  3:48 PM EST ----- The patient had multiple small bleeding sites seen on the capsule endoscopy and should be set up with [facility] or [facility] for a double-balloon enteroscopy. 

## 2021-01-28 NOTE — Telephone Encounter (Signed)
Pt notified of results and referral to [facility] for the double balloon enteroscopy.

## 2021-01-29 DIAGNOSIS — M79674 Pain in right toe(s): Secondary | ICD-10-CM | POA: Diagnosis not present

## 2021-01-29 DIAGNOSIS — R7303 Prediabetes: Secondary | ICD-10-CM | POA: Diagnosis not present

## 2021-01-29 DIAGNOSIS — L851 Acquired keratosis [keratoderma] palmaris et plantaris: Secondary | ICD-10-CM | POA: Diagnosis not present

## 2021-01-29 DIAGNOSIS — B351 Tinea unguium: Secondary | ICD-10-CM | POA: Diagnosis not present

## 2021-01-29 DIAGNOSIS — M79675 Pain in left toe(s): Secondary | ICD-10-CM | POA: Diagnosis not present

## 2021-01-30 ENCOUNTER — Ambulatory Visit: Payer: Medicare PPO

## 2021-01-30 DIAGNOSIS — H2512 Age-related nuclear cataract, left eye: Secondary | ICD-10-CM | POA: Diagnosis not present

## 2021-01-30 DIAGNOSIS — H25812 Combined forms of age-related cataract, left eye: Secondary | ICD-10-CM | POA: Diagnosis not present

## 2021-01-30 HISTORY — PX: CATARACT EXTRACTION: SUR2

## 2021-02-07 ENCOUNTER — Telehealth: Payer: Self-pay

## 2021-02-07 NOTE — Telephone Encounter (Signed)
Pt notified of capsule study results. Referral has been sent to [facility] for double balloon enteroscopy.

## 2021-02-07 NOTE — Telephone Encounter (Signed)
-----   Message from Lucilla Lame, MD sent at 01/27/2021  3:48 PM EST ----- The patient had multiple small bleeding sites seen on the capsule endoscopy and should be set up with [facility] or [facility] for a double-balloon enteroscopy.

## 2021-02-11 ENCOUNTER — Ambulatory Visit: Payer: Medicare PPO

## 2021-02-18 ENCOUNTER — Ambulatory Visit (INDEPENDENT_AMBULATORY_CARE_PROVIDER_SITE_OTHER): Payer: Medicare PPO

## 2021-02-18 DIAGNOSIS — Z0000 Encounter for general adult medical examination without abnormal findings: Secondary | ICD-10-CM

## 2021-02-18 NOTE — Patient Instructions (Signed)
Christina Salas , Thank you for taking time to come for your Medicare Wellness Visit. I appreciate your ongoing commitment to your health goals. Please review the following plan we discussed and let me know if I can assist you in the future.   Screening recommendations/referrals: Colonoscopy: no longer required Mammogram: done 7/16/212 Bone Density: done 06/23/19  Recommended yearly ophthalmology/optometry visit for glaucoma screening and checkup Recommended yearly dental visit for hygiene and checkup  Vaccinations: Influenza vaccine: done 10/15/20 Pneumococcal vaccine: done 12/03/14 Tdap vaccine: due Shingles vaccine: Shingrix discussed. Please contact your pharmacy for coverage information.  Covid-19: done 01/23/20, 02/13/20 & 10/28/20  Advanced directives: Advance directive discussed with you today. Even though you declined this today please call our office should you change your mind and we can give you the proper paperwork for you to fill out.  Conditions/risks identified: Recommend increasing physical activity   Next appointment: Follow up in one year for your annual wellness visit    Preventive Care 65 Years and Older, Female Preventive care refers to lifestyle choices and visits with your health care Kameka Whan that can promote health and wellness. What does preventive care include?  A yearly physical exam. This is also called an annual well check.  Dental exams once or twice a year.  Routine eye exams. Ask your health care Shakyla Nolley how often you should have your eyes checked.  Personal lifestyle choices, including:  Daily care of your teeth and gums.  Regular physical activity.  Eating a healthy diet.  Avoiding tobacco and drug use.  Limiting alcohol use.  Practicing safe sex.  Taking low-dose aspirin every day.  Taking vitamin and mineral supplements as recommended by your health care Kile Kabler. What happens during an annual well check? The services and screenings  done by your health care Tanaisha Pittman during your annual well check will depend on your age, overall health, lifestyle risk factors, and family history of disease. Counseling  Your health care Sharetha Newson may ask you questions about your:  Alcohol use.  Tobacco use.  Drug use.  Emotional well-being.  Home and relationship well-being.  Sexual activity.  Eating habits.  History of falls.  Memory and ability to understand (cognition).  Work and work Statistician.  Reproductive health. Screening  You may have the following tests or measurements:  Height, weight, and BMI.  Blood pressure.  Lipid and cholesterol levels. These may be checked every 5 years, or more frequently if you are over 33 years old.  Skin check.  Lung cancer screening. You may have this screening every year starting at age 58 if you have a 30-pack-year history of smoking and currently smoke or have quit within the past 15 years.  Fecal occult blood test (FOBT) of the stool. You may have this test every year starting at age 33.  Flexible sigmoidoscopy or colonoscopy. You may have a sigmoidoscopy every 5 years or a colonoscopy every 10 years starting at age 46.  Hepatitis C blood test.  Hepatitis B blood test.  Sexually transmitted disease (STD) testing.  Diabetes screening. This is done by checking your blood sugar (glucose) after you have not eaten for a while (fasting). You may have this done every 1-3 years.  Bone density scan. This is done to screen for osteoporosis. You may have this done starting at age 10.  Mammogram. This may be done every 1-2 years. Talk to your health care Marius Betts about how often you should have regular mammograms. Talk with your health care Laketra Bowdish about your  test results, treatment options, and if necessary, the need for more tests. Vaccines  Your health care Wendee Hata may recommend certain vaccines, such as:  Influenza vaccine. This is recommended every year.  Tetanus,  diphtheria, and acellular pertussis (Tdap, Td) vaccine. You may need a Td booster every 10 years.  Zoster vaccine. You may need this after age 63.  Pneumococcal 13-valent conjugate (PCV13) vaccine. One dose is recommended after age 66.  Pneumococcal polysaccharide (PPSV23) vaccine. One dose is recommended after age 18. Talk to your health care Fabiola Mudgett about which screenings and vaccines you need and how often you need them. This information is not intended to replace advice given to you by your health care Kalden Wanke. Make sure you discuss any questions you have with your health care Katrenia Alkins. Document Released: 01/10/2016 Document Revised: 09/02/2016 Document Reviewed: 10/15/2015 Elsevier Interactive Patient Education  2017 [place] Prevention in the Home Falls can cause injuries. They can happen to people of all ages. There are many things you can do to make your home safe and to help prevent falls. What can I do on the outside of my home?  Regularly fix the edges of walkways and driveways and fix any cracks.  Remove anything that might make you trip as you walk through a door, such as a raised step or threshold.  Trim any bushes or trees on the path to your home.  Use bright outdoor lighting.  Clear any walking paths of anything that might make someone trip, such as rocks or tools.  Regularly check to see if handrails are loose or broken. Make sure that both sides of any steps have handrails.  Any raised decks and porches should have guardrails on the edges.  Have any leaves, snow, or ice cleared regularly.  Use sand or salt on walking paths during winter.  Clean up any spills in your garage right away. This includes oil or grease spills. What can I do in the bathroom?  Use night lights.  Install grab bars by the toilet and in the tub and shower. Do not use towel bars as grab bars.  Use non-skid mats or decals in the tub or shower.  If you need to sit down in  the shower, use a plastic, non-slip stool.  Keep the floor dry. Clean up any water that spills on the floor as soon as it happens.  Remove soap buildup in the tub or shower regularly.  Attach bath mats securely with double-sided non-slip rug tape.  Do not have throw rugs and other things on the floor that can make you trip. What can I do in the bedroom?  Use night lights.  Make sure that you have a light by your bed that is easy to reach.  Do not use any sheets or blankets that are too big for your bed. They should not hang down onto the floor.  Have a firm chair that has side arms. You can use this for support while you get dressed.  Do not have throw rugs and other things on the floor that can make you trip. What can I do in the kitchen?  Clean up any spills right away.  Avoid walking on wet floors.  Keep items that you use a lot in easy-to-reach places.  If you need to reach something above you, use a strong step stool that has a grab bar.  Keep electrical cords out of the way.  Do not use floor polish or wax that  makes floors slippery. If you must use wax, use non-skid floor wax.  Do not have throw rugs and other things on the floor that can make you trip. What can I do with my stairs?  Do not leave any items on the stairs.  Make sure that there are handrails on both sides of the stairs and use them. Fix handrails that are broken or loose. Make sure that handrails are as long as the stairways.  Check any carpeting to make sure that it is firmly attached to the stairs. Fix any carpet that is loose or worn.  Avoid having throw rugs at the top or bottom of the stairs. If you do have throw rugs, attach them to the floor with carpet tape.  Make sure that you have a light switch at the top of the stairs and the bottom of the stairs. If you do not have them, ask someone to add them for you. What else can I do to help prevent falls?  Wear shoes that:  Do not have high  heels.  Have rubber bottoms.  Are comfortable and fit you well.  Are closed at the toe. Do not wear sandals.  If you use a stepladder:  Make sure that it is fully opened. Do not climb a closed stepladder.  Make sure that both sides of the stepladder are locked into place.  Ask someone to hold it for you, if possible.  Clearly mark and make sure that you can see:  Any grab bars or handrails.  First and last steps.  Where the edge of each step is.  Use tools that help you move around (mobility aids) if they are needed. These include:  Canes.  Walkers.  Scooters.  Crutches.  Turn on the lights when you go into a dark area. Replace any light bulbs as soon as they burn out.  Set up your furniture so you have a clear path. Avoid moving your furniture around.  If any of your floors are uneven, fix them.  If there are any pets around you, be aware of where they are.  Review your medicines with your doctor. Some medicines can make you feel dizzy. This can increase your chance of falling. Ask your doctor what other things that you can do to help prevent falls. This information is not intended to replace advice given to you by your health care Nyjah Schwake. Make sure you discuss any questions you have with your health care Lou Loewe. Document Released: 10/10/2009 Document Revised: 05/21/2016 Document Reviewed: 01/18/2015 Elsevier Interactive Patient Education  2017 [employer].

## 2021-02-18 NOTE — Progress Notes (Signed)
Subjective:   Christina Salas is a 87 y.o. female who presents for Medicare Annual (Subsequent) preventive examination.  Virtual Visit via Telephone Note  I connected with  Christina Salas on 02/18/21 at  3:30 PM EST by telephone and verified that I am speaking with the correct person using two identifiers.  Location: Patient: home Provider: [place] Persons participating in the virtual visit: [place]   I discussed the limitations, risks, security and privacy concerns of performing an evaluation and management service by telephone and the availability of in person appointments. The patient expressed understanding and agreed to proceed.  Interactive audio and video telecommunications were attempted between this nurse and patient, however failed, due to patient having technical difficulties OR patient did not have access to video capability.  We continued and completed visit with audio only.  Some vital signs may be absent or patient reported.   [name], LPN    Review of Systems     Cardiac Risk Factors include: advanced age (>44men, >88 women);dyslipidemia     Objective:    There were no vitals filed for this visit. There is no height or weight on file to calculate BMI.  Advanced Directives 02/18/2021 01/07/2021 01/30/2020 01/24/2019 12/27/2018 07/13/2017 04/14/2017  Does Patient Have a Medical Advance Directive? No No No No No No Yes  Type of Advance Directive - - - - - - Living will  Copy of [place] in Chart? - - - - - - -  Would patient like information on creating a medical advance directive? No - Patient declined - Yes (MAU/Ambulatory/Procedural Areas - Information given) Yes (MAU/Ambulatory/Procedural Areas - Information given) - - -    Current Medications (verified) Outpatient Encounter Medications as of 02/18/2021  Medication Sig  . baclofen (LIORESAL) 10 MG tablet Take 1 tablet (10 mg total) by mouth 3 (three) times daily  as needed for muscle spasms.  . butalbital-acetaminophen-caffeine (FIORICET) 50-325-40 MG tablet Take 1 tablet by mouth every 6 (six) hours as needed (as needed).  . Calcium-Magnesium 500-250 MG TABS Take 1 tablet by mouth 2 (two) times daily.  [name] CINNAMON PO Take 1 capsule by mouth daily. 350 mg  . Cranberry 500 MG CAPS Take 1 tablet by mouth daily.  [name] esomeprazole (NEXIUM) 20 MG capsule Take 1 capsule (20 mg total) by mouth daily.  . Misc Natural Products (LEG VEIN & CIRCULATION) TABS Take 2 capsules by mouth daily.  . Multiple Vitamins-Calcium (ESSENTIAL ONE DAILY MULTIVIT) TABS Take by mouth.  . Omega-3 Fatty Acids (FISH OIL) 1000 MG CAPS Take by mouth.  . prednisoLONE acetate (PRED FORTE) 1 % ophthalmic suspension INSTILL 1 DROP INTO LEFT EYE FOUR TIMES DAILY STARTING AFTER SURGERY-DOCTOR WILL GIVE FURTHER INSTRUCTIONS ABOUT TAPER  . [DISCONTINUED] mupirocin ointment (BACTROBAN) 2 % Apply 1 application topically daily. Qd to excision site   No facility-administered encounter medications on file as of 02/18/2021.    Allergies (verified) Patient has no known allergies.   History: Past Medical History:  Diagnosis Date  . Abnormal glucose   . Anxiety   . Arthritis   . Breast cancer ([place]) 1996   Left- mastectomy  . Cancer ([place])    melanoma  . Chronic headaches   . Depression   . Dysplastic nevus 05/11/2007   right upper gastric area. Slight to moderate atypia.  [name] Dysplastic nevus 06/24/2020   R lateral deltoid, moderate to severe atypia. Excised 08/13/2020, margins free.  [name] GERD (gastroesophageal reflux  disease)   . H/O melanoma excision   . History of diverticulitis   . History of recurrent UTIs   . Hypertension   . Melanoma ([place])    scalp  . Osteoporosis    pre-osetoporosis per patient history form 03/18/12  . Pre-diabetes   . Vitamin D deficiency    Past Surgical History:  Procedure Laterality Date  . ABDOMINAL HYSTERECTOMY  2001  . BREAST BIOPSY  2010  . BREAST  EXCISIONAL BIOPSY Right 1997   Benign excisional biopsy   . CATARACT EXTRACTION Left 01/30/2021   Dr. Luberta Mutter  . COLONOSCOPY  04/02/2014  . COLONOSCOPY WITH PROPOFOL N/A 12/27/2018   Procedure: COLONOSCOPY WITH PROPOFOL;  Surgeon: Lucilla Lame, MD;  Location: [facility] ENDOSCOPY;  Service: Endoscopy;  Laterality: N/A;  . CYSTOSCOPY  11/2014  . ESOPHAGOGASTRODUODENOSCOPY (EGD) WITH PROPOFOL N/A 12/27/2018   Procedure: ESOPHAGOGASTRODUODENOSCOPY (EGD) WITH PROPOFOL;  Surgeon: Lucilla Lame, MD;  Location: [facility] ENDOSCOPY;  Service: Endoscopy;  Laterality: N/A;  . ESOPHAGOGASTRODUODENOSCOPY (EGD) WITH PROPOFOL N/A 01/07/2021   Procedure: ESOPHAGOGASTRODUODENOSCOPY (EGD) WITH PROPOFOL;  Surgeon: Lucilla Lame, MD;  Location: [facility] ENDOSCOPY;  Service: Endoscopy;  Laterality: N/A;  . GIVENS CAPSULE STUDY N/A 01/07/2021   Procedure: GIVENS CAPSULE STUDY;  Surgeon: Lucilla Lame, MD;  Location: [facility] ENDOSCOPY;  Service: Endoscopy;  Laterality: N/A;  . MASTECTOMY  1996   Left  . MELANOMA EXCISION  1995   Head   Family History  Problem Relation Age of Onset  . Breast cancer Mother   . Migraines Mother   . Diabetes Father   . CAD Father   . Thrombosis Father   . Biliary Cirrhosis Brother   . Breast cancer Other        Niece   Social History   Socioeconomic History  . Marital status: Single    Spouse name: Not on file  . Number of children: 0  . Years of education: Not on file  . Highest education level: Master's degree (e.g., MA, MS, MEng, MEd, MSW, MBA)  Occupational History  . Occupation: Retired  Tobacco Use  . Smoking status: Never Smoker  . Smokeless tobacco: Never Used  Vaping Use  . Vaping Use: Never used  Substance and Sexual Activity  . Alcohol use: No    Alcohol/week: 0.0 standard drinks  . Drug use: Never  . Sexual activity: Not Currently  Other Topics Concern  . 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 Difficulty of Paying Living Expenses: Not hard at all  Food Insecurity: No Food Insecurity  . Worried About Charity fundraiser in the Last Year: Never true  . Ran Out of Food in the Last Year: Never true  Transportation Needs: No Transportation Needs  . Lack of Transportation (Medical): No  . Lack of Transportation (Non-Medical): No  Physical Activity: Inactive  . Days of Exercise per Week: 0 days  . Minutes of Exercise per Session: 0 min  Stress: No Stress Concern Present  . Feeling of Stress : Only a little  Social Connections: Moderately Isolated  . Frequency of Communication with Friends and Family: More than three times a week  . Frequency of Social Gatherings with Friends and Family: Three times a week  .  Attends Religious Services: More than 4 times per year  . Active Member of Clubs or Organizations: No  . Attends Archivist Meetings: Never  . Marital Status: Never married    Tobacco Counseling Counseling given: Not Answered   Clinical Intake:  Pre-visit preparation completed: Yes  Pain : No/denies pain     Nutritional Risks: None Diabetes: No  How often do you need to have someone help you when you read instructions, pamphlets, or other written materials from your doctor or pharmacy?: 1 - Never    Interpreter Needed?: No  Information entered by :: [name] LPN   Activities of Daily Living In your present state of health, do you have any difficulty performing the following activities: 02/18/2021 10/15/2020  Hearing? Tempie Donning  Vision? N N  Difficulty concentrating or making decisions? N Y  Walking or climbing stairs? N Y  Dressing or bathing? N N  Doing errands, shopping? N N  Preparing Food and eating ? N -  Using the Toilet? N -  In the past six months, have you accidently leaked urine? Y -  Comment wears pads  for protection -  Do you have problems with loss of bowel control? N -  Managing your Medications? N -  Managing your Finances? N -  Housekeeping or managing your Housekeeping? N -  Some recent data might be hidden    Patient Care Team: Steele Sizer, MD as PCP - General (Family Medicine) Murrell Redden, MD as Consulting Physician (Urology) Lucky Cowboy, Erskine Squibb, MD as Consulting Physician (Vascular Surgery) Yolonda Kida, MD as Consulting Physician (Cardiology) Luberta Mutter, MD as Consulting Physician (Ophthalmology) Ralene Bathe, MD as Consulting Physician (Dermatology) Caroline More, DPM as Consulting Physician (Podiatry) Lucilla Lame, MD as Consulting Physician (Gastroenterology)  Indicate any recent Medical Services you may have received from other than [facility] providers in the past year (date may be approximate).     Assessment:   This is a routine wellness examination for Christina Salas.  Hearing/Vision screen  Hearing Screening   125Hz  250Hz  500Hz  1000Hz  2000Hz  3000Hz  4000Hz  6000Hz  8000Hz   Right ear:           Left ear:           Comments: Pt has hearing aids maintained [facility]; does not wear them often   Vision Screening Comments: Annual vision screenings [facility] Dr. Ellie Lunch  Dietary issues and exercise activities discussed: Current Exercise Habits: The patient does not participate in regular exercise at present, Exercise limited by: orthopedic condition(s)  Goals    . Patient Stated     Pt has goals for herself to complete projects at home and organize pictures and family history information.       Depression Screen PHQ 2/9 Scores 02/18/2021 10/15/2020 04/15/2020 01/30/2020 12/15/2019 08/18/2019 07/26/2019  PHQ - 2 Score 0 2 2 0 2 2 2   PHQ- 9 Score - 6 5 - 4 9 5   Exception Documentation - - - - - - -  Not completed - - - - - - -    Fall Risk Fall Risk  02/18/2021 10/15/2020 04/15/2020 01/30/2020 12/15/2019  Falls in the past year? 1 0 0 0 0   Number falls in past yr: 0 0 0 0 0  Injury with Fall? 0 0 0 0 0  Risk for fall due to : No Fall Risks - - No Fall Risks -  Follow up Falls prevention discussed - - Falls prevention discussed -  FALL RISK PREVENTION PERTAINING TO THE HOME:  Any stairs in or around the home? No  If so, are there any without handrails? No  Home free of loose throw rugs in walkways, pet beds, electrical cords, etc? Yes  Adequate lighting in your home to reduce risk of falls? Yes   ASSISTIVE DEVICES UTILIZED TO PREVENT FALLS:  Life alert? No  Use of a cane, [place] or w/c? No  Grab bars in the bathroom? Yes  Shower chair or bench in shower? Yes  Elevated toilet seat or a handicapped toilet? Yes   TIMED UP AND GO:  Was the test performed? No . Telephonic visit.   Cognitive Function: Normal cognitive status assessed by direct observation by this Nurse Health Advisor. No abnormalities found.       6CIT Screen 01/30/2020 01/24/2019  What Year? 0 points 0 points  What month? 0 points 0 points  What time? 0 points 0 points  Count back from 20 0 points 0 points  Months in reverse 0 points 0 points  Repeat phrase 4 points 2 points  Total Score 4 2    Immunizations Immunization History  Administered Date(s) Administered  . Fluad Quad(high Dose 65+) 08/18/2019, 10/15/2020  . Influenza Split 09/10/2007, 11/13/2008, 09/27/2009, 10/17/2010  . Influenza, High Dose Seasonal PF 10/21/2015, 09/28/2016, 10/25/2017, 10/19/2018  . Influenza, Seasonal, Injecte, Preservative Fre 10/30/2011, 10/21/2012  . Influenza,inj,Quad PF,6+ Mos 09/06/2013, 10/19/2014  . PFIZER(Purple Top)SARS-COV-2 Vaccination 01/23/2020, 02/13/2020, 10/28/2020  . Pneumococcal Conjugate-13 12/03/2014  . Pneumococcal Polysaccharide-23 06/15/2012  . Tdap 12/03/2010    TDAP status: Due, Education has been provided regarding the importance of this vaccine. Advised may receive this vaccine at local pharmacy or Health Dept. Aware to provide  a copy of the vaccination record if obtained from local pharmacy or Health Dept. Verbalized acceptance and understanding.  Flu Vaccine status: Up to date  Pneumococcal vaccine status: Up to date  Covid-19 vaccine status: Completed vaccines  Qualifies for Shingles Vaccine? Yes   Zostavax completed Yes   Shingrix Completed?: No.    Education has been provided regarding the importance of this vaccine. Patient has been advised to call insurance company to determine out of pocket expense if they have not yet received this vaccine. Advised may also receive vaccine at local pharmacy or Health Dept. Verbalized acceptance and understanding.  Screening Tests Health Maintenance  Topic Date Due  . TETANUS/TDAP  12/03/2020  . COVID-19 Vaccine (4 - Booster for Pfizer series) 04/27/2021  . INFLUENZA VACCINE  Completed  . DEXA SCAN  Completed  . PNA vac Low Risk Adult  Completed    Health Maintenance  Health Maintenance Due  Topic Date Due  . TETANUS/TDAP  12/03/2020    Colorectal cancer screening: No longer required.   Mammogram status: Completed 07/12/20. Repeat every year  Bone Density status: Completed 06/23/19. Results reflect: Bone density results: OSTEOPENIA. Repeat every 2 years.  Lung Cancer Screening: (Low Dose CT Chest recommended if Age 68-80 years, 30 pack-year currently smoking OR have quit w/in 15years.) does not qualify.   Additional Screening:  Hepatitis C Screening: does not qualify.   Vision Screening: Recommended annual ophthalmology exams for early detection of glaucoma and other disorders of the eye. Is the patient up to date with their annual eye exam?  Yes  Who is the provider or what is the name of the office in which the patient attends annual eye exams? Dr. Ellie Lunch  Dental Screening: Recommended annual dental exams for proper oral  hygiene  Community Resource Referral / Chronic Care Management: CRR required this visit?  No   CCM required this visit?  No       Plan:     I have personally reviewed and noted the following in the patient's chart:   . Medical and social history . Use of alcohol, tobacco or illicit drugs  . Current medications and supplements . Functional ability and status . Nutritional status . Physical activity . Advanced directives . List of other physicians . Hospitalizations, surgeries, and ER visits in previous 12 months . Vitals . Screenings to include cognitive, depression, and falls . Referrals and appointments  In addition, I have reviewed and discussed with patient certain preventive protocols, quality metrics, and best practice recommendations. A written personalized care plan for preventive services as well as general preventive health recommendations were provided to patient.     [name], LPN   [id number]   Nurse Notes: none

## 2021-02-27 DIAGNOSIS — H2511 Age-related nuclear cataract, right eye: Secondary | ICD-10-CM | POA: Diagnosis not present

## 2021-02-27 DIAGNOSIS — H25811 Combined forms of age-related cataract, right eye: Secondary | ICD-10-CM | POA: Diagnosis not present

## 2021-02-28 ENCOUNTER — Ambulatory Visit: Payer: Self-pay | Admitting: Urology

## 2021-03-04 ENCOUNTER — Other Ambulatory Visit: Payer: Self-pay

## 2021-03-04 ENCOUNTER — Encounter: Payer: Self-pay | Admitting: Gastroenterology

## 2021-03-04 ENCOUNTER — Ambulatory Visit: Payer: Medicare PPO | Admitting: Gastroenterology

## 2021-03-04 VITALS — BP 171/67 | HR 86 | Ht 61.0 in | Wt 157.0 lb

## 2021-03-04 DIAGNOSIS — E611 Iron deficiency: Secondary | ICD-10-CM | POA: Diagnosis not present

## 2021-03-04 NOTE — Progress Notes (Signed)
Primary Care Physician: Steele Sizer, MD  Primary Gastroenterologist:  Dr. Lucilla Lame  Chief Complaint  Patient presents with  . Iron deficiency anemia    HPI: Christina Salas is a 87 y.o. female here for follow-up after having a capsule endoscopy.  The patient had a capsule endoscopy for anemia.  The patient was found to have multiple AVMs in the small bowel. The patient's most recent labs showed the iron saturations below a 10 and her hemoglobin to be Normal at 13.7.  The patient denies any symptoms of anemia and is not taking any iron supplementation.  Past Medical History:  Diagnosis Date  . Abnormal glucose   . Anxiety   . Arthritis   . Breast cancer ([place]) 1996   Left- mastectomy  . Cancer ([place])    melanoma  . Chronic headaches   . Depression   . Dysplastic nevus 05/11/2007   right upper gastric area. Slight to moderate atypia.  [name] Dysplastic nevus 06/24/2020   R lateral deltoid, moderate to severe atypia. Excised 08/13/2020, margins free.  [name] GERD (gastroesophageal reflux disease)   . H/O melanoma excision   . History of diverticulitis   . History of recurrent UTIs   . Hypertension   . Melanoma ([place])    scalp  . Osteoporosis    pre-osetoporosis per patient history form 03/18/12  . Pre-diabetes   . Vitamin D deficiency     Current Outpatient Medications  Medication Sig Dispense Refill  . baclofen (LIORESAL) 10 MG tablet Take 1 tablet (10 mg total) by mouth 3 (three) times daily as needed for muscle spasms. 90 each 0  . butalbital-acetaminophen-caffeine (FIORICET) 50-325-40 MG tablet Take 1 tablet by mouth every 6 (six) hours as needed (as needed). 20 tablet 0  . Calcium-Magnesium 500-250 MG TABS Take 1 tablet by mouth 2 (two) times daily.    [name] CINNAMON PO Take 1 capsule by mouth daily. 350 mg    . Cranberry 500 MG CAPS Take 1 tablet by mouth daily.    [name] esomeprazole (NEXIUM) 20 MG capsule Take 1 capsule (20 mg total) by mouth daily. 90 capsule 1  . Misc  Natural Products (LEG VEIN & CIRCULATION) TABS Take 2 capsules by mouth daily.    . Multiple Vitamins-Calcium (ESSENTIAL ONE DAILY MULTIVIT) TABS Take by mouth.    . Omega-3 Fatty Acids (FISH OIL) 1000 MG CAPS Take by mouth.    . prednisoLONE acetate (PRED FORTE) 1 % ophthalmic suspension INSTILL 1 DROP INTO LEFT EYE FOUR TIMES DAILY STARTING AFTER SURGERY-DOCTOR WILL GIVE FURTHER INSTRUCTIONS ABOUT TAPER     No current facility-administered medications for this visit.    Allergies as of 03/04/2021  . (No Known Allergies)    ROS:  General: Negative for anorexia, weight loss, fever, chills, fatigue, weakness. ENT: Negative for hoarseness, difficulty swallowing , nasal congestion. CV: Negative for chest pain, angina, palpitations, dyspnea on exertion, peripheral edema.  Respiratory: Negative for dyspnea at rest, dyspnea on exertion, cough, sputum, wheezing.  GI: See history of present illness. GU:  Negative for dysuria, hematuria, urinary incontinence, urinary frequency, nocturnal urination.  Endo: Negative for unusual weight change.    Physical Examination:   BP (!) 171/67   Pulse 86   Ht 5\' 1"  (1.549 m)   Wt 157 lb (71.2 kg)   BMI 29.66 kg/m   General: Well-nourished, well-developed in no acute distress.  Eyes: No icterus. Conjunctivae pink. Neuro: Alert and oriented x 3.  Grossly intact.  Skin: Warm and dry, no jaundice.   Psych: Alert and cooperative, normal mood and affect.  Labs:    Imaging Studies: No results found.  Assessment and Plan:   [name] is a 87 y.o. y/o female who comes in with a history of iron deficiency and a low iron saturation.  The patient had a capsule endoscopy that showed her to have multiple small bowel AVMs.  It was recommended that the patient undergo a push enteroscopy versus a double balloon enteroscopy.  The patient is a symptomatically the present time and her most recent hemoglobin has been normal.  The patient will have her  hemoglobin and iron studies checked again and due to her age of 6 if her Blood count can be Entertained on supplemental iron without undergoing any further procedures that would be optimal.  The patient will have her labs checked and if they are significantly lower than she will consider undergoing further procedures.  The patient has been explained the plan and agrees with it.     Lucilla Lame, MD. Marval Regal    Note: This dictation was prepared with Dragon dictation along with smaller phrase technology. Any transcriptional errors that result from this process are unintentional.

## 2021-03-05 ENCOUNTER — Telehealth: Payer: Self-pay

## 2021-03-05 LAB — CBC WITH DIFFERENTIAL/PLATELET
Basophils Absolute: 0 10*3/uL (ref 0.0–0.2)
Basos: 0 %
EOS (ABSOLUTE): 0.1 10*3/uL (ref 0.0–0.4)
Eos: 1 %
Hematocrit: 39.7 % (ref 34.0–46.6)
Hemoglobin: 12.9 g/dL (ref 11.1–15.9)
Immature Grans (Abs): 0 10*3/uL (ref 0.0–0.1)
Immature Granulocytes: 0 %
Lymphocytes Absolute: 2 10*3/uL (ref 0.7–3.1)
Lymphs: 38 %
MCH: 28.2 pg (ref 26.6–33.0)
MCHC: 32.5 g/dL (ref 31.5–35.7)
MCV: 87 fL (ref 79–97)
Monocytes Absolute: 0.3 10*3/uL (ref 0.1–0.9)
Monocytes: 6 %
Neutrophils Absolute: 3 10*3/uL (ref 1.4–7.0)
Neutrophils: 55 %
Platelets: 166 10*3/uL (ref 150–450)
RBC: 4.57 x10E6/uL (ref 3.77–5.28)
RDW: 14.1 % (ref 11.7–15.4)
WBC: 5.4 10*3/uL (ref 3.4–10.8)

## 2021-03-05 LAB — IRON,TIBC AND FERRITIN PANEL
Ferritin: 14 ng/mL — ABNORMAL LOW (ref 15–150)
Iron Saturation: 13 % — ABNORMAL LOW (ref 15–55)
Iron: 51 ug/dL (ref 27–139)
Total Iron Binding Capacity: 406 ug/dL (ref 250–450)
UIBC: 355 ug/dL (ref 118–369)

## 2021-03-05 NOTE — Telephone Encounter (Signed)
-----   Message from Lucilla Lame, MD sent at 03/05/2021  8:04 AM EST ----- Let the patient know that her blood count is normal and her iron levels are still low.  I would suggest that we just watch and monitor her at this time.  She can take a multivitamin with iron to help with her low iron but otherwise I do not recommend any further invasive procedures on this very pleasant 87 year old woman.

## 2021-03-05 NOTE — Telephone Encounter (Signed)
LVM for pt to return my call.

## 2021-03-06 DIAGNOSIS — R0600 Dyspnea, unspecified: Secondary | ICD-10-CM | POA: Diagnosis not present

## 2021-03-06 DIAGNOSIS — R600 Localized edema: Secondary | ICD-10-CM | POA: Diagnosis not present

## 2021-03-06 DIAGNOSIS — K219 Gastro-esophageal reflux disease without esophagitis: Secondary | ICD-10-CM | POA: Diagnosis not present

## 2021-03-06 DIAGNOSIS — I208 Other forms of angina pectoris: Secondary | ICD-10-CM | POA: Diagnosis not present

## 2021-03-06 DIAGNOSIS — D649 Anemia, unspecified: Secondary | ICD-10-CM | POA: Diagnosis not present

## 2021-03-07 NOTE — Telephone Encounter (Signed)
Tried contacting pt again. No voicemail to leave message.

## 2021-03-10 ENCOUNTER — Other Ambulatory Visit: Payer: Self-pay | Admitting: Family Medicine

## 2021-03-10 DIAGNOSIS — G44209 Tension-type headache, unspecified, not intractable: Secondary | ICD-10-CM

## 2021-03-10 NOTE — Telephone Encounter (Signed)
Requested medication (s) are due for refill today: yes  Requested medication (s) are on the active medication list:  yes  Last refill:  10/15/20  Future visit scheduled: yes  Notes to clinic: not delegated    Requested Prescriptions  Pending Prescriptions Disp Refills   butalbital-acetaminophen-caffeine (FIORICET) 50-325-40 MG tablet [Pharmacy Med Name: BUTALBITAL/ACETAMINOPHEN/CAFF TABS] 20 tablet     Sig: TAKE 1 TABLET BY MOUTH EVERY 6 HOURS AS NEEDED      Not Delegated - Analgesics:  Non-Opioid Analgesic Combinations Failed - 03/10/2021 12:31 PM      Failed - This refill cannot be delegated      Passed - Valid encounter within last 12 months    Recent Outpatient Visits           4 months ago Mild depression [facility])   [facility] Steele Sizer, MD   10 months ago Mild depression [facility])   [facility] Steele Sizer, MD   1 year ago Tension headache   [facility] Steele Sizer, MD   1 year ago Cervical radiculopathy   [facility] Steele Sizer, MD   1 year ago Cervical radiculopathy   [facility] Steele Sizer, MD       Future Appointments             In 1 month Ancil Boozer, Drue Stager, MD [facility] [place]   In 11 months  [facility]

## 2021-04-15 ENCOUNTER — Encounter: Payer: Self-pay | Admitting: Family Medicine

## 2021-04-15 ENCOUNTER — Ambulatory Visit: Payer: Medicare PPO | Admitting: Family Medicine

## 2021-04-15 ENCOUNTER — Other Ambulatory Visit: Payer: Self-pay

## 2021-04-15 VITALS — BP 132/78 | HR 88 | Temp 97.9°F | Resp 16 | Ht 62.0 in | Wt 154.0 lb

## 2021-04-15 DIAGNOSIS — M5412 Radiculopathy, cervical region: Secondary | ICD-10-CM

## 2021-04-15 DIAGNOSIS — E611 Iron deficiency: Secondary | ICD-10-CM

## 2021-04-15 DIAGNOSIS — K219 Gastro-esophageal reflux disease without esophagitis: Secondary | ICD-10-CM

## 2021-04-15 DIAGNOSIS — E781 Pure hyperglyceridemia: Secondary | ICD-10-CM

## 2021-04-15 DIAGNOSIS — F320 Major depressive disorder, single episode, mild: Secondary | ICD-10-CM

## 2021-04-15 DIAGNOSIS — M8580 Other specified disorders of bone density and structure, unspecified site: Secondary | ICD-10-CM | POA: Diagnosis not present

## 2021-04-15 DIAGNOSIS — Z780 Asymptomatic menopausal state: Secondary | ICD-10-CM

## 2021-04-15 DIAGNOSIS — R739 Hyperglycemia, unspecified: Secondary | ICD-10-CM | POA: Diagnosis not present

## 2021-04-15 DIAGNOSIS — E441 Mild protein-calorie malnutrition: Secondary | ICD-10-CM

## 2021-04-15 DIAGNOSIS — E559 Vitamin D deficiency, unspecified: Secondary | ICD-10-CM | POA: Diagnosis not present

## 2021-04-15 DIAGNOSIS — I208 Other forms of angina pectoris: Secondary | ICD-10-CM | POA: Diagnosis not present

## 2021-04-15 DIAGNOSIS — F32A Depression, unspecified: Secondary | ICD-10-CM

## 2021-04-15 DIAGNOSIS — N390 Urinary tract infection, site not specified: Secondary | ICD-10-CM

## 2021-04-15 DIAGNOSIS — K5520 Angiodysplasia of colon without hemorrhage: Secondary | ICD-10-CM

## 2021-04-15 MED ORDER — ESOMEPRAZOLE MAGNESIUM 20 MG PO CPDR: 20.0000 mg | DELAYED_RELEASE_CAPSULE | Freq: Every day | ORAL | 1 refills | Status: DC

## 2021-04-15 MED ORDER — IRON (FERROUS SULFATE) 325 (65 FE) MG PO TABS: 325.0000 mg | ORAL_TABLET | Freq: Every day | ORAL | 1 refills | Status: DC

## 2021-04-15 NOTE — Progress Notes (Signed)
Name: Christina Salas   MRN: [id number]    DOB: September 29, 1933   Date:04/15/2021       Progress Note  Subjective  Chief Complaint  Follow Up  HPI  Chronic cystitis: she was seen by Dr. Junious Silk , she states she has been doing well, she takes Macrobid . She takes medication when she has urinary frequency.  Reviewed last renal US   IMPRESSION:08/2020  Increased echogenicity compatible with medical renal disease. No hydronephrosis.  Left kidney lower pole parapelvic cysts. Largest cyst measures 3 cm.  Cervical Radiculopathy: she  states pain has improved with baclofen prn, still feels stiff at times but pain is not as intense, and occasionally tingling down to right shoulder.She will let us know when she decides to see Dr. Vertell Limber at [facility] ( she has never seen him but her niece had neck surgery done by him) She takes baclofen prn only and seems to help with symptoms. I gave her Lyrica a while back but she never took the medication.   Tension headache: she is taking fioricet prn and needs a refill, she describes headaches as dull and or sharp, not as frequent now, she states baclofen also helps when she has a headache. Usually frontal, very seldom she has associated  nausea or vomiting. She states usually mild and does not take medication . She usually takes Fioricet once a month   Iron deficiency: last HCT back to normal, iron storage improved up from 11 to 14 She was seen by Dr. Durwin Reges back in 11/2018, had EGD and colonoscopy , she also had multiple AVM of small bowel , Dr. Durwin Reges discussed either push enteroscopy versus double balloon enteroscopy, or continue monitoring for anemia and take iron supplementation. She would like to do the later at this time   Osteopenia: she refuses to go back on Alendronate, she is worried about side effects. Discussed high calcium diet and vitamin D 2000 units daily . Unchanged   Mild Depression: she has long history of depression, she states not  feeling great right now, states life is boring, denies suicidal thoughts or ideation, but has lack of energy and also lack of appetite She recently lost her sister to aneurysm of aorta  Pre-diabetes: she denies polyphagia or polyuria, she feels her mouth gets dry at times. Last A1C was 6.3%, we will recheck labs today    GERD: controlled with Nexium at this time, she is aware of long term side effects. She denies heart burn or indigestion   Malnutrition: she has lost almost 23 lbs since last August 2020 - other visit virtual, lost only 3-4 lbs since last Summer. Weight is stable.She thinks secondary to stress, worries about her niece Discussed taking protein shakes   Angina: she sees Dr. Clayborn Bigness. She states only has symptoms occasionally , no sure of last episode   Echo from 2017  [place] AN ESTIMATED EF = >55 %  NORMAL RIGHT VENTRICULAR SYSTOLIC FUNCTION  MILD TRICUSPID AND MITRAL VALVE INSUFFICIENCY  TRACE AORTIC VALVE INSUFFICIENCY  NO VALVULAR STENOSIS  MILD BIATRIAL ENLARGEMENT   ECG Interpretation: 2017  Rest OIZ:TIWPYK sinus rhythm, none  Stress DXI:PJASNK sinus rhythm,  Recovery NLZ:JQBHAL sinus rhythm  ECG Interpretation:non-diagnostic due to pharmacologic testing  Patient Active Problem List   Diagnosis Date Noted  . Stricture and stenosis of esophagus   . Mild depression ([place]) 01/24/2019  . Large hiatal hernia 01/24/2019  . Internal hemorrhoids 01/24/2019  . Iron deficiency anemia  due to chronic blood loss   . Venous reflux 04/19/2018  . Swelling of limb 04/27/2017  . Prediabetes 06/08/2016  . Obesity (BMI 30.0-34.9) 06/08/2016  . Vitamin D deficiency 06/08/2016  . Post-menopausal 12/11/2015  . DD (diverticular disease) 06/19/2015  . Edema extremities 06/19/2015  . Esophageal reflux 06/19/2015  . HLD (hyperlipidemia) 06/19/2015  . Varicose veins of leg with swelling, bilateral 06/19/2015  . Recurrent UTI 02/08/2013   . Mixed incontinence 02/08/2013  . History of breast cancer, left, T1, N0, Left MRM - Dec 1996 03/18/2012  . Tension headache 05/24/2007  . History of melanoma 08/28/1994    Past Surgical History:  Procedure Laterality Date  . ABDOMINAL HYSTERECTOMY  2001  . BREAST BIOPSY  2010  . BREAST EXCISIONAL BIOPSY Right 1997   Benign excisional biopsy   . CATARACT EXTRACTION Left 01/30/2021   Dr. Luberta Mutter  . COLONOSCOPY  04/02/2014  . COLONOSCOPY WITH PROPOFOL N/A 12/27/2018   Procedure: COLONOSCOPY WITH PROPOFOL;  Surgeon: Lucilla Lame, MD;  Location[facility] ENDOSCOPY;  Service: Endoscopy;  Laterality: N/A;  . CYSTOSCOPY  11/2014  . ESOPHAGOGASTRODUODENOSCOPY (EGD) WITH PROPOFOL N/A 12/27/2018   Procedure: ESOPHAGOGASTRODUODENOSCOPY (EGD) WITH PROPOFOL;  Surgeon: Lucilla Lame, MD;  Location: [facility] ENDOSCOPY;  Service: Endoscopy;  Laterality: N/A;  . ESOPHAGOGASTRODUODENOSCOPY (EGD) WITH PROPOFOL N/A 01/07/2021   Procedure: ESOPHAGOGASTRODUODENOSCOPY (EGD) WITH PROPOFOL;  Surgeon: Lucilla Lame, MD;  Location: [facility] ENDOSCOPY;  Service: Endoscopy;  Laterality: N/A;  . GIVENS CAPSULE STUDY N/A 01/07/2021   Procedure: GIVENS CAPSULE STUDY;  Surgeon: Lucilla Lame, MD;  Location: [facility] ENDOSCOPY;  Service: Endoscopy;  Laterality: N/A;  . MASTECTOMY  1996   Left  . MELANOMA EXCISION  1995   Head    Family History  Problem Relation Age of Onset  . Breast cancer Mother   . Migraines Mother   . Diabetes Father   . CAD Father   . Thrombosis Father   . Biliary Cirrhosis Brother   . Breast cancer Other        Niece    Social History   Tobacco Use  . Smoking status: Never Smoker  . Smokeless tobacco: Never Used  Substance Use Topics  . Alcohol use: No    Alcohol/week: 0.0 standard drinks     Current Outpatient Medications:  .  baclofen (LIORESAL) 10 MG tablet, Take 1 tablet (10 mg total) by mouth 3 (three) times daily as needed for muscle spasms., Disp: 90 each, Rfl: 0 .   butalbital-acetaminophen-caffeine (FIORICET) 50-325-40 MG tablet, TAKE 1 TABLET BY MOUTH EVERY 6 HOURS AS NEEDED, Disp: 20 tablet, Rfl: 0 .  Calcium-Magnesium 500-250 MG TABS, Take 1 tablet by mouth 2 (two) times daily., Disp: , Rfl:  .  CINNAMON PO, Take 1 capsule by mouth daily. 350 mg, Disp: , Rfl:  .  Cranberry 500 MG CAPS, Take 1 tablet by mouth daily., Disp: , Rfl:  .  esomeprazole (NEXIUM) 20 MG capsule, Take 1 capsule (20 mg total) by mouth daily., Disp: 90 capsule, Rfl: 1 .  Misc Natural Products (LEG VEIN & CIRCULATION) TABS, Take 2 capsules by mouth daily., Disp: , Rfl:  .  Multiple Vitamins-Calcium (ESSENTIAL ONE DAILY MULTIVIT) TABS, Take by mouth., Disp: , Rfl:  .  Omega-3 Fatty Acids (FISH OIL) 1000 MG CAPS, Take by mouth., Disp: , Rfl:  .  prednisoLONE acetate (PRED FORTE) 1 % ophthalmic suspension, INSTILL 1 DROP INTO LEFT EYE FOUR TIMES DAILY STARTING AFTER SURGERY-DOCTOR WILL GIVE FURTHER INSTRUCTIONS ABOUT TAPER (  Patient not taking: Reported on 04/15/2021), Disp: , Rfl:   No Known Allergies  I personally reviewed active problem list, medication list, allergies, family history, social history, health maintenance, notes from last encounter with the patient/caregiver today.   ROS  Constitutional: Negative for fever or weight change.  Respiratory: Negative for cough and shortness of breath.   Cardiovascular: Negative for chest pain or palpitations.  Gastrointestinal: Negative for abdominal pain, no bowel changes.  Musculoskeletal: Negative for gait problem or joint swelling.  Skin: Negative for rash.  Neurological: Negative for dizziness or headache.  No other specific complaints in a complete review of systems (except as listed in HPI above).  Objective  Vitals:   04/15/21 1331  BP: 132/78  Pulse: 88  Resp: 16  Temp: 97.9 F (36.6 C)  TempSrc: Oral  SpO2: 95%  Weight: 154 lb (69.9 kg)  Height: 5\' 2"  (1.575 m)    Body mass index is 28.17 kg/m.  Physical  Exam  Constitutional: Patient appears well-developed and well-nourished. Obese  No distress.  HEENT: head atraumatic, normocephalic, pupils equal and reactive to light,  neck supple Cardiovascular: Normal rate, regular rhythm and normal heart sounds.  No murmur heard. No BLE edema. Pulmonary/Chest: Effort normal and breath sounds normal. No respiratory distress. Abdominal: Soft.  There is no tenderness. Psychiatric: Patient has a normal mood and affect. behavior is normal. Judgment and thought content normal.  Recent Results (from the past 2160 hour(s))  CBC with Differential/Platelet     Status: None   Collection Time: 03/04/21  3:35 PM  Result Value Ref Range   WBC 5.4 3.4 - 10.8 x10E3/uL   RBC 4.57 3.77 - 5.28 x10E6/uL   Hemoglobin 12.9 11.1 - 15.9 g/dL   Hematocrit 39.7 34.0 - 46.6 %   MCV 87 79 - 97 fL   MCH 28.2 26.6 - 33.0 pg   MCHC 32.5 31.5 - 35.7 g/dL   RDW 14.1 11.7 - 15.4 %   Platelets 166 150 - 450 x10E3/uL   Neutrophils 55 Not Estab. %   Lymphs 38 Not Estab. %   Monocytes 6 Not Estab. %   Eos 1 Not Estab. %   Basos 0 Not Estab. %   Neutrophils Absolute 3.0 1.4 - 7.0 x10E3/uL   Lymphocytes Absolute 2.0 0.7 - 3.1 x10E3/uL   Monocytes Absolute 0.3 0.1 - 0.9 x10E3/uL   EOS (ABSOLUTE) 0.1 0.0 - 0.4 x10E3/uL   Basophils Absolute 0.0 0.0 - 0.2 x10E3/uL   Immature Granulocytes 0 Not Estab. %   Immature Grans (Abs) 0.0 0.0 - 0.1 x10E3/uL  Fe+TIBC+Fer     Status: Abnormal   Collection Time: 03/04/21  3:35 PM  Result Value Ref Range   Total Iron Binding Capacity 406 250 - 450 ug/dL   UIBC 355 118 - 369 ug/dL   Iron 51 27 - 139 ug/dL   Iron Saturation 13 (L) 15 - 55 %   Ferritin 14 (L) 15 - 150 ng/mL      PHQ2/9: Depression screen [facility] 2/9 04/15/2021 02/18/2021 10/15/2020 04/15/2020 01/30/2020  Decreased Interest 0 0 1 1 0  Down, Depressed, Hopeless 0 0 1 1 0  PHQ - 2 Score 0 0 2 2 0  Altered sleeping - - 1 1 -  Tired, decreased energy - - 1 1 -  Change in appetite -  - 1 0 -  Feeling bad or failure about yourself  - - 1 1 -  Trouble concentrating - - 0 0 -  Moving slowly or fidgety/restless - - 0 0 -  Suicidal thoughts - - 0 0 -  PHQ-9 Score - - 6 5 -  Difficult doing work/chores - - Somewhat difficult Somewhat difficult -  Some recent data might be hidden    phq 9 is negative   Fall Risk: Fall Risk  04/15/2021 02/18/2021 10/15/2020 04/15/2020 01/30/2020  Falls in the past year? 1 1 0 0 0  Number falls in past yr: 0 0 0 0 0  Injury with Fall? 0 0 0 0 0  Risk for fall due to : - No Fall Risks - - No Fall Risks  Follow up - Falls prevention discussed - - Falls prevention discussed     Functional Status Survey: Is the patient deaf or have difficulty hearing?: Yes Does the patient have difficulty seeing, even when wearing glasses/contacts?: No Does the patient have difficulty concentrating, remembering, or making decisions?: No Does the patient have difficulty walking or climbing stairs?: Yes Does the patient have difficulty dressing or bathing?: No Does the patient have difficulty doing errands alone such as visiting a doctor's office or shopping?: No   Assessment & Plan  1. Mild depression (HCC)  Stable  2. Angina at rest [facility]  Under the care of cardiologist   3. Vitamin D deficiency  Discussed 2000 units daily   4. Hypertriglyceridemia   5. Hyperglycemia  Recheck labs next visit   6. Cervical radiculopathy  She has not called neurosurgeon yet, she is thinking about taking Lyrica   7. Gastroesophageal reflux disease without esophagitis  - esomeprazole (NEXIUM) 20 MG capsule; Take 1 capsule (20 mg total) by mouth daily.  Dispense: 90 capsule; Refill: 1  8. Osteopenia after menopause   9. Iron deficiency  Advised one iron tablet per day  10. Recurrent UTI  She takes antibiotic prn   11. Mild protein-calorie malnutrition ([place])  Discussed protein supplementation   12. AVM (arteriovenous malformation) of small  bowel, acquired  She is considering procedure

## 2021-04-30 DIAGNOSIS — N3946 Mixed incontinence: Secondary | ICD-10-CM | POA: Diagnosis not present

## 2021-04-30 DIAGNOSIS — N3000 Acute cystitis without hematuria: Secondary | ICD-10-CM | POA: Diagnosis not present

## 2021-05-08 ENCOUNTER — Encounter: Payer: Self-pay | Admitting: Family Medicine

## 2021-05-19 ENCOUNTER — Telehealth: Payer: Self-pay

## 2021-05-19 NOTE — Telephone Encounter (Signed)
Copied from [place] [phone]. Topic: General - Other >> May 19, 2021 11:13 AM Keene Breath wrote: Reason for CRM: Patient called to ask the doctor if she feels she should get the 4th Booster vaccine.  She stated she would like the doctor's opinion.  Please advise

## 2021-05-20 NOTE — Telephone Encounter (Signed)
Spoke with patient and advised her to get her 4th covid booster per Dr.Sowles' recommendation. She was pleasant and verbalized appreciation.

## 2021-05-28 ENCOUNTER — Other Ambulatory Visit: Payer: Self-pay | Admitting: Family Medicine

## 2021-05-28 DIAGNOSIS — M79674 Pain in right toe(s): Secondary | ICD-10-CM | POA: Diagnosis not present

## 2021-05-28 DIAGNOSIS — L851 Acquired keratosis [keratoderma] palmaris et plantaris: Secondary | ICD-10-CM | POA: Diagnosis not present

## 2021-05-28 DIAGNOSIS — G44209 Tension-type headache, unspecified, not intractable: Secondary | ICD-10-CM

## 2021-05-28 DIAGNOSIS — M79675 Pain in left toe(s): Secondary | ICD-10-CM | POA: Diagnosis not present

## 2021-05-28 DIAGNOSIS — B351 Tinea unguium: Secondary | ICD-10-CM | POA: Diagnosis not present

## 2021-05-28 NOTE — Telephone Encounter (Signed)
Last seen 4.19.2022 next sch'd 10.19.2022

## 2021-05-28 NOTE — Telephone Encounter (Signed)
Requested medication (s) are due for refill today: no   Requested medication (s) are on the active medication list: yes   Last refill:  03/10/2021  Future visit scheduled: yes   Notes to clinic:  this refill cannot be delegated   Requested Prescriptions  Pending Prescriptions Disp Refills   butalbital-acetaminophen-caffeine (FIORICET) 50-325-40 MG tablet [Pharmacy Med Name: BUTALBITAL/ACETAMINOPHEN/CAFF TABS] 20 tablet     Sig: TAKE 1 TABLET BY MOUTH EVERY 6 HOURS AS NEEDED      Not Delegated - Analgesics:  Non-Opioid Analgesic Combinations Failed - 05/28/2021 12:57 PM      Failed - This refill cannot be delegated      Passed - Valid encounter within last 12 months    Recent Outpatient Visits           1 month ago Mild depression [facility])   [facility] Steele Sizer, MD   7 months ago Mild depression [facility])   [facility] Steele Sizer, MD   1 year ago Mild depression [facility])   [facility] Steele Sizer, MD   1 year ago Tension headache   [facility] Steele Sizer, MD   1 year ago Cervical radiculopathy   [facility] Steele Sizer, MD       Future Appointments             In 4 months Ancil Boozer, Drue Stager, MD [facility] [place]   In 8 months  [facility], [facility]

## 2021-06-11 ENCOUNTER — Other Ambulatory Visit: Payer: Self-pay

## 2021-06-11 ENCOUNTER — Ambulatory Visit (INDEPENDENT_AMBULATORY_CARE_PROVIDER_SITE_OTHER): Payer: Medicare PPO | Admitting: Dermatology

## 2021-06-11 DIAGNOSIS — Z1283 Encounter for screening for malignant neoplasm of skin: Secondary | ICD-10-CM | POA: Diagnosis not present

## 2021-06-11 DIAGNOSIS — Z85820 Personal history of malignant melanoma of skin: Secondary | ICD-10-CM

## 2021-06-11 DIAGNOSIS — D1800 Hemangioma unspecified site: Secondary | ICD-10-CM

## 2021-06-11 DIAGNOSIS — Z86018 Personal history of other benign neoplasm: Secondary | ICD-10-CM | POA: Diagnosis not present

## 2021-06-11 DIAGNOSIS — Z853 Personal history of malignant neoplasm of breast: Secondary | ICD-10-CM | POA: Diagnosis not present

## 2021-06-11 DIAGNOSIS — L821 Other seborrheic keratosis: Secondary | ICD-10-CM | POA: Diagnosis not present

## 2021-06-11 DIAGNOSIS — L578 Other skin changes due to chronic exposure to nonionizing radiation: Secondary | ICD-10-CM | POA: Diagnosis not present

## 2021-06-11 DIAGNOSIS — B353 Tinea pedis: Secondary | ICD-10-CM | POA: Diagnosis not present

## 2021-06-11 DIAGNOSIS — D229 Melanocytic nevi, unspecified: Secondary | ICD-10-CM

## 2021-06-11 DIAGNOSIS — B351 Tinea unguium: Secondary | ICD-10-CM | POA: Diagnosis not present

## 2021-06-11 DIAGNOSIS — L814 Other melanin hyperpigmentation: Secondary | ICD-10-CM | POA: Diagnosis not present

## 2021-06-11 MED ORDER — TAVABOROLE 5 % EX SOLN: 1.0000 "application " | Freq: Every evening | CUTANEOUS | 11 refills | Status: DC

## 2021-06-11 MED ORDER — KETOCONAZOLE 2 % EX CREA: 1.0000 "application " | TOPICAL_CREAM | Freq: Every evening | CUTANEOUS | 11 refills | Status: DC

## 2021-06-11 NOTE — Patient Instructions (Signed)

## 2021-06-11 NOTE — Progress Notes (Signed)
   Follow-Up Visit   Subjective  Christina Salas is a 87 y.o. female who presents for the following: Annual Exam (History of Melanoma and dysplastic nevi). The patient presents for Total-Body Skin Exam (TBSE) for skin cancer screening and mole check.  The following portions of the chart were reviewed this encounter and updated as appropriate:   Tobacco  Allergies  Meds  Problems  Med Hx  Surg Hx  Fam Hx      Review of Systems:  No other skin or systemic complaints except as noted in HPI or Assessment and Plan.  Objective  Well appearing patient in no apparent distress; mood and affect are within normal limits.  A full examination was performed including scalp, head, eyes, ears, nose, lips, neck, chest, axillae, abdomen, back, buttocks, bilateral upper extremities, bilateral lower extremities, hands, feet, fingers, toes, fingernails, and toenails. All findings within normal limits unless otherwise noted below.  Scaliness and toenail dystrophy  Left Breast Well healed mastectomy scar. No lymphadenopathy   Assessment & Plan   Lentigines - Scattered tan macules - Due to sun exposure - Benign-appering, observe - Recommend daily broad spectrum sunscreen SPF 30+ to sun-exposed areas, reapply every 2 hours as needed. - Call for any changes  Seborrheic Keratoses - Stuck-on, waxy, tan-brown papules and/or plaques  - Benign-appearing - Discussed benign etiology and prognosis. - Observe - Call for any changes  Melanocytic Nevi - Tan-brown and/or pink-flesh-colored symmetric macules and papules - Benign appearing on exam today - Observation - Call clinic for new or changing moles - Recommend daily use of broad spectrum spf 30+ sunscreen to sun-exposed areas.   Hemangiomas - Red papules - Discussed benign nature - Observe - Call for any changes  Actinic Damage - Chronic condition, secondary to cumulative UV/sun exposure - diffuse scaly erythematous macules with  underlying dyspigmentation - Recommend daily broad spectrum sunscreen SPF 30+ to sun-exposed areas, reapply every 2 hours as needed.  - Staying in the shade or wearing long sleeves, sun glasses (UVA+UVB protection) and wide brim hats (4-inch brim around the entire circumference of the hat) are also recommended for sun protection.  - Call for new or changing lesions.  Skin cancer screening performed today.  History of Melanoma - No evidence of recurrence today - No lymphadenopathy - Recommend regular full body skin exams - Recommend daily broad spectrum sunscreen SPF 30+ to sun-exposed areas, reapply every 2 hours as needed.  - Call if any new or changing lesions are noted between office visits  History of Dysplastic Nevi - No evidence of recurrence today - Recommend regular full body skin exams - Recommend daily broad spectrum sunscreen SPF 30+ to sun-exposed areas, reapply every 2 hours as needed.  - Call if any new or changing lesions are noted between office visits  History of breast cancer Left Breast Clear today No lymphadenopathy  Tinea pedis of both feet Tinea Pedis and Unguium Chronic; persistent. Start Ketoconazole 2% cream qhs to feet,  Kerydin solution qhs to nails   Tavaborole 5 % SOLN Apply 1 application topically at bedtime. ketoconazole (NIZORAL) 2 % cream Apply 1 application topically at bedtime.  Skin cancer screening  Return in about 1 year (around 06/11/2022) for TBSE.  I, Ashok Cordia, CMA, am acting as scribe for Sarina Ser, MD .  Documentation: I have reviewed the above documentation for accuracy and completeness, and I agree with the above.  Sarina Ser, MD

## 2021-06-18 ENCOUNTER — Encounter: Payer: Self-pay | Admitting: Dermatology

## 2021-07-03 ENCOUNTER — Other Ambulatory Visit: Payer: Self-pay | Admitting: Family Medicine

## 2021-07-03 DIAGNOSIS — Z1231 Encounter for screening mammogram for malignant neoplasm of breast: Secondary | ICD-10-CM

## 2021-07-10 ENCOUNTER — Ambulatory Visit
Admission: RE | Admit: 2021-07-10 | Discharge: 2021-07-10 | Disposition: A | Discharge status: Home / Self Care | Payer: Medicare PPO | Source: Ambulatory Visit | Attending: Family Medicine | Admitting: Family Medicine

## 2021-07-10 ENCOUNTER — Other Ambulatory Visit: Payer: Self-pay

## 2021-07-10 DIAGNOSIS — Z1231 Encounter for screening mammogram for malignant neoplasm of breast: Secondary | ICD-10-CM | POA: Insufficient documentation

## 2021-07-14 ENCOUNTER — Other Ambulatory Visit: Payer: Self-pay | Admitting: Family Medicine

## 2021-07-14 DIAGNOSIS — R928 Other abnormal and inconclusive findings on diagnostic imaging of breast: Secondary | ICD-10-CM

## 2021-07-14 DIAGNOSIS — N6310 Unspecified lump in the right breast, unspecified quadrant: Secondary | ICD-10-CM

## 2021-07-22 ENCOUNTER — Other Ambulatory Visit: Payer: Self-pay

## 2021-07-22 ENCOUNTER — Ambulatory Visit
Admission: RE | Admit: 2021-07-22 | Discharge: 2021-07-22 | Disposition: A | Discharge status: Home / Self Care | Payer: Medicare PPO | Source: Ambulatory Visit | Attending: Family Medicine | Admitting: Family Medicine

## 2021-07-22 DIAGNOSIS — R928 Other abnormal and inconclusive findings on diagnostic imaging of breast: Secondary | ICD-10-CM | POA: Diagnosis not present

## 2021-07-22 DIAGNOSIS — N6310 Unspecified lump in the right breast, unspecified quadrant: Secondary | ICD-10-CM

## 2021-07-22 DIAGNOSIS — N6001 Solitary cyst of right breast: Secondary | ICD-10-CM | POA: Diagnosis not present

## 2021-07-22 DIAGNOSIS — R922 Inconclusive mammogram: Secondary | ICD-10-CM | POA: Diagnosis not present

## 2021-08-01 ENCOUNTER — Other Ambulatory Visit: Payer: Self-pay | Admitting: Family Medicine

## 2021-08-01 DIAGNOSIS — K219 Gastro-esophageal reflux disease without esophagitis: Secondary | ICD-10-CM

## 2021-08-11 DIAGNOSIS — Z9012 Acquired absence of left breast and nipple: Secondary | ICD-10-CM | POA: Diagnosis not present

## 2021-08-11 DIAGNOSIS — C50112 Malignant neoplasm of central portion of left female breast: Secondary | ICD-10-CM | POA: Diagnosis not present

## 2021-08-11 DIAGNOSIS — Z4432 Encounter for fitting and adjustment of external left breast prosthesis: Secondary | ICD-10-CM | POA: Diagnosis not present

## 2021-08-21 ENCOUNTER — Other Ambulatory Visit: Payer: Self-pay | Admitting: Family Medicine

## 2021-08-21 DIAGNOSIS — E611 Iron deficiency: Secondary | ICD-10-CM

## 2021-08-29 DIAGNOSIS — M79674 Pain in right toe(s): Secondary | ICD-10-CM | POA: Diagnosis not present

## 2021-08-29 DIAGNOSIS — M79675 Pain in left toe(s): Secondary | ICD-10-CM | POA: Diagnosis not present

## 2021-08-29 DIAGNOSIS — B351 Tinea unguium: Secondary | ICD-10-CM | POA: Diagnosis not present

## 2021-08-29 DIAGNOSIS — L851 Acquired keratosis [keratoderma] palmaris et plantaris: Secondary | ICD-10-CM | POA: Diagnosis not present

## 2021-10-02 DIAGNOSIS — E782 Mixed hyperlipidemia: Secondary | ICD-10-CM | POA: Diagnosis not present

## 2021-10-02 DIAGNOSIS — R0609 Other forms of dyspnea: Secondary | ICD-10-CM | POA: Diagnosis not present

## 2021-10-02 DIAGNOSIS — G629 Polyneuropathy, unspecified: Secondary | ICD-10-CM | POA: Diagnosis not present

## 2021-10-02 DIAGNOSIS — I714 Abdominal aortic aneurysm, without rupture, unspecified: Secondary | ICD-10-CM | POA: Diagnosis not present

## 2021-10-02 DIAGNOSIS — D649 Anemia, unspecified: Secondary | ICD-10-CM | POA: Diagnosis not present

## 2021-10-02 DIAGNOSIS — I208 Other forms of angina pectoris: Secondary | ICD-10-CM | POA: Diagnosis not present

## 2021-10-02 DIAGNOSIS — K219 Gastro-esophageal reflux disease without esophagitis: Secondary | ICD-10-CM | POA: Diagnosis not present

## 2021-10-02 DIAGNOSIS — I2510 Atherosclerotic heart disease of native coronary artery without angina pectoris: Secondary | ICD-10-CM | POA: Diagnosis not present

## 2021-10-02 DIAGNOSIS — R600 Localized edema: Secondary | ICD-10-CM | POA: Diagnosis not present

## 2021-10-15 ENCOUNTER — Ambulatory Visit: Payer: Medicare PPO | Admitting: Family Medicine

## 2021-10-15 ENCOUNTER — Encounter: Payer: Self-pay | Admitting: Family Medicine

## 2021-10-15 ENCOUNTER — Other Ambulatory Visit: Payer: Self-pay

## 2021-10-15 VITALS — BP 120/58 | HR 74 | Temp 97.6°F | Resp 16 | Ht <= 58 in | Wt 152.5 lb

## 2021-10-15 DIAGNOSIS — M8580 Other specified disorders of bone density and structure, unspecified site: Secondary | ICD-10-CM

## 2021-10-15 DIAGNOSIS — R739 Hyperglycemia, unspecified: Secondary | ICD-10-CM | POA: Diagnosis not present

## 2021-10-15 DIAGNOSIS — R7303 Prediabetes: Secondary | ICD-10-CM | POA: Diagnosis not present

## 2021-10-15 DIAGNOSIS — Z780 Asymptomatic menopausal state: Secondary | ICD-10-CM

## 2021-10-15 DIAGNOSIS — E781 Pure hyperglyceridemia: Secondary | ICD-10-CM

## 2021-10-15 DIAGNOSIS — Z23 Encounter for immunization: Secondary | ICD-10-CM

## 2021-10-15 DIAGNOSIS — Z862 Personal history of diseases of the blood and blood-forming organs and certain disorders involving the immune mechanism: Secondary | ICD-10-CM

## 2021-10-15 DIAGNOSIS — E441 Mild protein-calorie malnutrition: Secondary | ICD-10-CM | POA: Diagnosis not present

## 2021-10-15 DIAGNOSIS — K5520 Angiodysplasia of colon without hemorrhage: Secondary | ICD-10-CM | POA: Diagnosis not present

## 2021-10-15 DIAGNOSIS — I208 Other forms of angina pectoris: Secondary | ICD-10-CM

## 2021-10-15 DIAGNOSIS — K449 Diaphragmatic hernia without obstruction or gangrene: Secondary | ICD-10-CM | POA: Diagnosis not present

## 2021-10-15 DIAGNOSIS — M5412 Radiculopathy, cervical region: Secondary | ICD-10-CM

## 2021-10-15 DIAGNOSIS — I2089 Other forms of angina pectoris: Secondary | ICD-10-CM

## 2021-10-15 DIAGNOSIS — E559 Vitamin D deficiency, unspecified: Secondary | ICD-10-CM

## 2021-10-15 DIAGNOSIS — K219 Gastro-esophageal reflux disease without esophagitis: Secondary | ICD-10-CM

## 2021-10-15 DIAGNOSIS — Z859 Personal history of malignant neoplasm, unspecified: Secondary | ICD-10-CM

## 2021-10-15 MED ORDER — BACLOFEN 10 MG PO TABS: 10.0000 mg | ORAL_TABLET | Freq: Three times a day (TID) | ORAL | 0 refills | Status: DC | PRN

## 2021-10-15 MED ORDER — ESOMEPRAZOLE MAGNESIUM 20 MG PO CPDR
20.0000 mg | DELAYED_RELEASE_CAPSULE | Freq: Every day | ORAL | 1 refills | Status: DC
Start: 2021-10-15 — End: 2022-04-15

## 2021-10-15 NOTE — Progress Notes (Signed)
Name: Christina Salas   MRN: [id number]    DOB: Oct 11, 1933   Date:10/15/2021       Progress Note  Subjective  Chief Complaint  Follow Up  HPI  Chronic cystitis: she was seen by Dr. Junious Silk , she states she has been doing well, she takes Macrobid . She takes medication when she has urinary frequency.  Reviewed last renal US No recent episodes   IMPRESSION:08/2020  Increased echogenicity compatible with medical renal disease. No hydronephrosis.   Left kidney lower pole parapelvic cysts. Largest cyst measures 3 cm.   Cervical Radiculopathy: she  states pain has improved with baclofen prn, still feels stiff at times but pain is not as intense, and occasionally tingling down to right shoulder.She will let us know when she decides to see Dr. Vertell Limber at [facility] ( she has never seen him but her niece had neck surgery done by him) She takes baclofen prn only and seems to help with symptoms.She never filled Lyrica , worried about side effects    Tension headache: she is taking fioricet prn and needs a refill, she describes headaches as dull and or sharp, not as frequent now, she states baclofen also helps when she has a headache. Usually frontal, very seldom she has associated  nausea or vomiting. She states usually mild and does not take medication . She usually takes Fioricet once a month and still has medication at home    Iron deficiency: last HCT back to normal, iron storage improved up from 11 to 14 She was seen by Dr. Durwin Reges back in 11/2018, had EGD and colonoscopy , she also had multiple AVM of small bowel , Dr. Durwin Reges discussed either push enteroscopy versus double balloon enteroscopy, or continue monitoring for anemia and take iron supplementation. She chose the later initially but is now thinking about having it done , she states the iron supplementation causes her to have bowel urgency.    Osteopenia: she refuses to go back on Alendronate, she is worried about side effects.  Discussed high calcium diet and vitamin D 2000 units daily . We will recheck vitamin D level    Mild Depression: she has long history of depression, she states not feeling great right now, states life is boring, denies suicidal thoughts or ideation, but has lack of energy and also lack of appetite She is still grieving the loss of her sister that died 2021-03-30 -but she is starting to feel back to normal   Pre-diabetes: she denies polyphagia or polyuria, she feels her mouth gets dry at times. Last A1C was down from 6.3 % , last visit it was down to 5.8 %    GERD: controlled with Nexium at this time, she is aware of long term side effects. She denies heart burn or indigestion . She is under the care of Dr. Allen Norris   Malnutrition: she lost 20 lbs from pre-pandemic to last year, her weight is still trending down but not losing as fast, today weight is down to 1525 lbs. She states she eats small amounts of food, she states she will try to add Ensure  Angina: she sees Dr. Clayborn Bigness. She states only has symptoms occasionally.   Echo from Mar 30, 2016  NORMAL LEFT VENTRICULAR SYSTOLIC FUNCTION WITH AN ESTIMATED EF = >55 %  NORMAL RIGHT VENTRICULAR SYSTOLIC FUNCTION  MILD TRICUSPID AND MITRAL VALVE INSUFFICIENCY  TRACE AORTIC VALVE INSUFFICIENCY  NO VALVULAR STENOSIS  MILD BIATRIAL ENLARGEMENT   ECG Interpretation: 03-30-16  Rest ECG:  normal sinus rhythm, none  Stress ECG:  normal sinus rhythm,  Recovery ECG:  normal sinus rhythm  ECG Interpretation:  non-diagnostic due to pharmacologic testing  Recent Fall: using a quad cane, it was over 6 months ago, at night , discussed PT but she is not interested at this time   Patient Active Problem List   Diagnosis Date Noted   Stricture and stenosis of esophagus    Mild depression 01/24/2019   Large hiatal hernia 01/24/2019   Internal hemorrhoids 01/24/2019   Iron deficiency anemia due to chronic blood loss    Venous reflux 04/19/2018   Swelling of limb  04/27/2017   Prediabetes 06/08/2016   Obesity (BMI 30.0-34.9) 06/08/2016   Vitamin D deficiency 06/08/2016   Post-menopausal 12/11/2015   DD (diverticular disease) 06/19/2015   Edema extremities 06/19/2015   Esophageal reflux 06/19/2015   HLD (hyperlipidemia) 06/19/2015   Varicose veins of leg with swelling, bilateral 06/19/2015   Recurrent UTI 02/08/2013   Mixed incontinence 02/08/2013   History of breast cancer, left, T1, N0, Left MRM - Dec 1996 03/18/2012   Tension headache 05/24/2007   History of melanoma 08/28/1994    Past Surgical History:  Procedure Laterality Date   ABDOMINAL HYSTERECTOMY  2001   BREAST BIOPSY  2010   BREAST EXCISIONAL BIOPSY Right 1997   Benign excisional biopsy    CATARACT EXTRACTION Left 01/30/2021   Dr. Luberta Mutter   COLONOSCOPY  04/02/2014   COLONOSCOPY WITH PROPOFOL N/A 12/27/2018   Procedure: COLONOSCOPY WITH PROPOFOL;  Surgeon: Lucilla Lame, MD;  Location: [facility] ENDOSCOPY;  Service: Endoscopy;  Laterality: N/A;   CYSTOSCOPY  11/2014   ESOPHAGOGASTRODUODENOSCOPY (EGD) WITH PROPOFOL N/A 12/27/2018   Procedure: ESOPHAGOGASTRODUODENOSCOPY (EGD) WITH PROPOFOL;  Surgeon: Lucilla Lame, MD;  Location: [facility] ENDOSCOPY;  Service: Endoscopy;  Laterality: N/A;   ESOPHAGOGASTRODUODENOSCOPY (EGD) WITH PROPOFOL N/A 01/07/2021   Procedure: ESOPHAGOGASTRODUODENOSCOPY (EGD) WITH PROPOFOL;  Surgeon: Lucilla Lame, MD;  Location: [facility] ENDOSCOPY;  Service: Endoscopy;  Laterality: N/A;   GIVENS CAPSULE STUDY N/A 01/07/2021   Procedure: GIVENS CAPSULE STUDY;  Surgeon: Lucilla Lame, MD;  Location: [facility] ENDOSCOPY;  Service: Endoscopy;  Laterality: N/A;   MASTECTOMY  1996   Left   MELANOMA EXCISION  1995   Head    Family History  Problem Relation Age of Onset   Breast cancer Mother    Migraines Mother    Diabetes Father    CAD Father    Thrombosis Father    Aortic aneurysm Sister    Biliary Cirrhosis Brother    Breast cancer Other        Niece    Social  History   Tobacco Use   Smoking status: Never   Smokeless tobacco: Never  Substance Use Topics   Alcohol use: No    Alcohol/week: 0.0 standard drinks     Current Outpatient Medications:    baclofen (LIORESAL) 10 MG tablet, Take 1 tablet (10 mg total) by mouth 3 (three) times daily as needed for muscle spasms., Disp: 90 each, Rfl: 0   butalbital-acetaminophen-caffeine (FIORICET) 50-325-40 MG tablet, TAKE 1 TABLET BY MOUTH EVERY 6 HOURS AS NEEDED, Disp: 20 tablet, Rfl: 0   Calcium-Magnesium 500-250 MG TABS, Take 1 tablet by mouth 2 (two) times daily., Disp: , Rfl:    CINNAMON PO, Take 1 capsule by mouth daily. 350 mg, Disp: , Rfl:    Cranberry 500 MG CAPS, Take 1 tablet by mouth daily., Disp: , Rfl:    FEROSUL 325 (65  Fe) MG tablet, TAKE 1 TABLET BY MOUTH DAILY, Disp: 100 tablet, Rfl: 1   ketoconazole (NIZORAL) 2 % cream, Apply 1 application topically at bedtime., Disp: 60 g, Rfl: 11   Misc Natural Products (LEG VEIN & CIRCULATION) TABS, Take 2 capsules by mouth daily., Disp: , Rfl:    Multiple Vitamins-Calcium (ESSENTIAL ONE DAILY MULTIVIT) TABS, Take by mouth., Disp: , Rfl:    Omega-3 Fatty Acids (FISH OIL) 1000 MG CAPS, Take by mouth., Disp: , Rfl:    Tavaborole 5 % SOLN, Apply 1 application topically at bedtime., Disp: 10 mL, Rfl: 11   esomeprazole (NEXIUM) 20 MG capsule, TAKE 1 CAPSULE(20 MG) BY MOUTH DAILY (Patient not taking: Reported on 10/15/2021), Disp: 90 capsule, Rfl: 0   esomeprazole (NEXIUM) 20 MG capsule, TAKE ONE CAPSULE BY MOUTH EVERY DAY (Patient not taking: Reported on 10/15/2021), Disp: 90 capsule, Rfl: 0  No Known Allergies  I personally reviewed active problem list, medication list, allergies, family history, social history, health maintenance with the patient/caregiver today.   ROS  Constitutional: Negative for fever or weight change.  Respiratory: Negative for cough and shortness of breath.   Cardiovascular: Negative for chest pain or palpitations.   Gastrointestinal: Negative for abdominal pain, no bowel changes.  Musculoskeletal: Negative for gait problem or joint swelling.  Skin: Negative for rash.  Neurological: Negative for dizziness or headache.  No other specific complaints in a complete review of systems (except as listed in HPI above).   Objective  Vitals:   10/15/21 1321 10/15/21 1341  BP: (!) 152/76 (!) 120/58  Pulse: 74   Resp: 16   Temp: 97.6 F (36.4 C)   TempSrc: Oral   SpO2: 96%   Weight: 152 lb 8 oz (69.2 kg)   Height: 4\' 9"  (1.448 m)     Body mass index is 33 kg/m.  Physical Exam  Constitutional: Patient appears well-developed and malnourished . No distress.  HEENT: head atraumatic, normocephalic, pupils equal and reactive to light, neck supple Cardiovascular: Normal rate, regular rhythm and normal heart sounds.  No murmur heard. No BLE edema. Pulmonary/Chest: Effort normal and breath sounds normal. No respiratory distress. Abdominal: Soft.  There is no tenderness. Psychiatric: Patient has a normal mood and affect. behavior is normal. Judgment and thought content normal.    PHQ2/9: Depression screen [facility] 2/9 10/15/2021 04/15/2021 02/18/2021 10/15/2020 04/15/2020  Decreased Interest 1 0 0 1 1  Down, Depressed, Hopeless 1 0 0 1 1  PHQ - 2 Score 2 0 0 2 2  Altered sleeping 0 - - 1 1  Tired, decreased energy 0 - - 1 1  Change in appetite 1 - - 1 0  Feeling bad or failure about yourself  0 - - 1 1  Trouble concentrating 1 - - 0 0  Moving slowly or fidgety/restless 0 - - 0 0  Suicidal thoughts 0 - - 0 0  PHQ-9 Score 4 - - 6 5  Difficult doing work/chores Not difficult at all - - Somewhat difficult Somewhat difficult  Some recent data might be hidden    phq 9 is positive - states still grieving    Fall Risk: Fall Risk  10/15/2021 04/15/2021 02/18/2021 10/15/2020 04/15/2020  Falls in the past year? 1 1 1  0 0  Number falls in past yr: 1 0 0 0 0  Injury with Fall? 1 0 0 0 0  Risk for fall due to :  Impaired balance/gait - No Fall Risks - -  Follow  up Falls prevention discussed - Falls prevention discussed - -      Assessment & Plan  1. Need for influenza vaccination  - Flu Vaccine QUAD High Dose(Fluad)  2. Angina at rest [facility])  Under the care of Dr. Clayborn Bigness   3. Vitamin D deficiency  - VITAMIN D 25 Hydroxy (Vit-D Deficiency, Fractures)  4. Hyperglycemia  - Hemoglobin A1c  5. Osteopenia after menopause   6. AVM (arteriovenous malformation) of small bowel, acquired  No recent blood in stools.   7. Mild protein-calorie malnutrition (HCC)  - COMPLETE METABOLIC PANEL WITH GFR  8. Prediabetes   9. Large hiatal hernia   10. Hypertriglyceridemia  - Lipid panel  11. Gastroesophageal reflux disease without esophagitis  - esomeprazole (NEXIUM) 20 MG capsule; Take 1 capsule (20 mg total) by mouth daily.  Dispense: 90 capsule; Refill: 1  12. History of cancer   13. History of iron deficiency anemia  - CBC with Differential/Platelet - Iron, TIBC and Ferritin Panel  14. Cervical radiculopathy  - baclofen (LIORESAL) 10 MG tablet; Take 1 tablet (10 mg total) by mouth 3 (three) times daily as needed for muscle spasms.  Dispense: 90 each; Refill: 0 er.

## 2021-10-16 ENCOUNTER — Other Ambulatory Visit: Payer: Self-pay

## 2021-10-16 DIAGNOSIS — E441 Mild protein-calorie malnutrition: Secondary | ICD-10-CM | POA: Diagnosis not present

## 2021-10-16 DIAGNOSIS — Z862 Personal history of diseases of the blood and blood-forming organs and certain disorders involving the immune mechanism: Secondary | ICD-10-CM | POA: Diagnosis not present

## 2021-10-16 DIAGNOSIS — E559 Vitamin D deficiency, unspecified: Secondary | ICD-10-CM | POA: Diagnosis not present

## 2021-10-16 DIAGNOSIS — R739 Hyperglycemia, unspecified: Secondary | ICD-10-CM | POA: Diagnosis not present

## 2021-10-16 DIAGNOSIS — E781 Pure hyperglyceridemia: Secondary | ICD-10-CM | POA: Diagnosis not present

## 2021-10-17 LAB — HEMOGLOBIN A1C
Hgb A1c MFr Bld: 5.5 % of total Hgb (ref <5.7)
Mean Plasma Glucose: 111 mg/dL
eAG (mmol/L): 6.2 mmol/L

## 2021-10-17 LAB — CBC WITH DIFFERENTIAL/PLATELET
Absolute Monocytes: 311 cells/uL (ref 200–950)
Basophils Absolute: 10 cells/uL (ref 0–200)
Basophils Relative: 0.2 %
Eosinophils Absolute: 71 cells/uL (ref 15–500)
Eosinophils Relative: 1.4 %
HCT: 40.7 % (ref 35.0–45.0)
Hemoglobin: 13.6 g/dL (ref 11.7–15.5)
Lymphs Abs: 1974 cells/uL (ref 850–3900)
MCH: 29.9 pg (ref 27.0–33.0)
MCHC: 33.4 g/dL (ref 32.0–36.0)
MCV: 89.5 fL (ref 80.0–100.0)
MPV: 10.2 fL (ref 7.5–12.5)
Monocytes Relative: 6.1 %
Neutro Abs: 2734 cells/uL (ref 1500–7800)
Neutrophils Relative %: 53.6 %
Platelets: 153 10*3/uL (ref 140–400)
RBC: 4.55 10*6/uL (ref 3.80–5.10)
RDW: 12.8 % (ref 11.0–15.0)
Total Lymphocyte: 38.7 %
WBC: 5.1 10*3/uL (ref 3.8–10.8)

## 2021-10-17 LAB — COMPLETE METABOLIC PANEL WITH GFR
AG Ratio: 1.6 (calc) (ref 1.0–2.5)
ALT: 8 U/L (ref 6–29)
AST: 14 U/L (ref 10–35)
Albumin: 4.2 g/dL (ref 3.6–5.1)
Alkaline phosphatase (APISO): 73 U/L (ref 37–153)
BUN: 16 mg/dL (ref 7–25)
CO2: 26 mmol/L (ref 20–32)
Calcium: 9.6 mg/dL (ref 8.6–10.4)
Chloride: 106 mmol/L (ref 98–110)
Creat: 0.7 mg/dL (ref 0.60–0.95)
Globulin: 2.6 g/dL (calc) (ref 1.9–3.7)
Glucose, Bld: 94 mg/dL (ref 65–99)
Potassium: 3.7 mmol/L (ref 3.5–5.3)
Sodium: 144 mmol/L (ref 135–146)
Total Bilirubin: 0.5 mg/dL (ref 0.2–1.2)
Total Protein: 6.8 g/dL (ref 6.1–8.1)
eGFR: 85 mL/min/{1.73_m2} (ref >=60)

## 2021-10-17 LAB — LIPID PANEL
Cholesterol: 180 mg/dL (ref <200)
HDL: 55 mg/dL (ref >=50)
LDL Cholesterol (Calc): 98 mg/dL (calc)
Non-HDL Cholesterol (Calc): 125 mg/dL (calc) (ref <130)
Total CHOL/HDL Ratio: 3.3 (calc) (ref <5.0)
Triglycerides: 174 mg/dL — ABNORMAL HIGH (ref <150)

## 2021-10-17 LAB — IRON,TIBC AND FERRITIN PANEL
%SAT: 26 % (calc) (ref 16–45)
Ferritin: 33 ng/mL (ref 16–288)
Iron: 97 ug/dL (ref 45–160)
TIBC: 368 mcg/dL (calc) (ref 250–450)

## 2021-10-17 LAB — VITAMIN D 25 HYDROXY (VIT D DEFICIENCY, FRACTURES): Vit D, 25-Hydroxy: 44 ng/mL (ref 30–100)

## 2021-11-06 ENCOUNTER — Telehealth: Payer: Self-pay

## 2021-11-06 NOTE — Telephone Encounter (Signed)
Reviewed lab results with patient. Answered all questions. Pt gave verbal understanding.

## 2021-11-06 NOTE — Telephone Encounter (Signed)
Copied from [place] [id number]. Topic: General - Other >> Nov 06, 2021 11:46 AM Yvette Rack wrote: Reason for CRM: Pt request return call to go over most recent lab results.

## 2021-11-26 DIAGNOSIS — I208 Other forms of angina pectoris: Secondary | ICD-10-CM | POA: Diagnosis not present

## 2021-11-26 DIAGNOSIS — I2510 Atherosclerotic heart disease of native coronary artery without angina pectoris: Secondary | ICD-10-CM | POA: Diagnosis not present

## 2021-12-01 DIAGNOSIS — R7303 Prediabetes: Secondary | ICD-10-CM | POA: Diagnosis not present

## 2021-12-01 DIAGNOSIS — M79674 Pain in right toe(s): Secondary | ICD-10-CM | POA: Diagnosis not present

## 2021-12-01 DIAGNOSIS — B351 Tinea unguium: Secondary | ICD-10-CM | POA: Diagnosis not present

## 2021-12-01 DIAGNOSIS — L851 Acquired keratosis [keratoderma] palmaris et plantaris: Secondary | ICD-10-CM | POA: Diagnosis not present

## 2021-12-01 DIAGNOSIS — M79675 Pain in left toe(s): Secondary | ICD-10-CM | POA: Diagnosis not present

## 2021-12-02 ENCOUNTER — Ambulatory Visit (INDEPENDENT_AMBULATORY_CARE_PROVIDER_SITE_OTHER): Payer: Medicare PPO | Admitting: Gastroenterology

## 2021-12-02 ENCOUNTER — Encounter: Payer: Self-pay | Admitting: Gastroenterology

## 2021-12-02 VITALS — BP 177/71 | HR 78 | Ht <= 58 in | Wt 156.2 lb

## 2021-12-02 DIAGNOSIS — E611 Iron deficiency: Secondary | ICD-10-CM

## 2021-12-02 NOTE — Progress Notes (Signed)
Primary Care Physician: Steele Sizer, MD  Primary Gastroenterologist:  Dr. Lucilla Lame  Chief Complaint  Patient presents with   Diarrhea    HPI: Christina Salas is a 87 y.o. female here for follow-up of her anemia. The patient's hemoglobin has been stable and normal with low iron studies and a capsule endoscopy that showed multiple AVMs.  The patient was recommended to undergo a double balloon enteroscopy with possible cauterization.  The patient comes in today wanting to notify recommend this procedure at her age.  The patient states that she has been taking iron and has switched to every other day.  She is not having any issues that she can attribute to her anemia.  Past Medical History:  Diagnosis Date   Abnormal glucose    Anxiety    Arthritis    Breast cancer ([place]) 1996   Left- mastectomy   Cancer ([place])    melanoma   Chronic headaches    Depression    Dysplastic nevus 05/11/2007   right upper gastric area. Slight to moderate atypia.   Dysplastic nevus 06/24/2020   R lateral deltoid, moderate to severe atypia. Excised 08/13/2020, margins free.   GERD (gastroesophageal reflux disease)    H/O melanoma excision    History of diverticulitis    History of recurrent UTIs    Hypertension    Melanoma ([place])    scalp   Osteoporosis    pre-osetoporosis per patient history form 03/18/12   Pre-diabetes    Vitamin D deficiency     Current Outpatient Medications  Medication Sig Dispense Refill   baclofen (LIORESAL) 10 MG tablet Take 1 tablet (10 mg total) by mouth 3 (three) times daily as needed for muscle spasms. 90 each 0   butalbital-acetaminophen-caffeine (FIORICET) 50-325-40 MG tablet TAKE 1 TABLET BY MOUTH EVERY 6 HOURS AS NEEDED 20 tablet 0   Calcium-Magnesium 500-250 MG TABS Take 1 tablet by mouth 2 (two) times daily.     CINNAMON PO Take 1 capsule by mouth daily. 350 mg     Cranberry 500 MG CAPS Take 1 tablet by mouth daily.     esomeprazole (NEXIUM) 20 MG  capsule Take 1 capsule (20 mg total) by mouth daily. 90 capsule 1   FEROSUL 325 (65 Fe) MG tablet TAKE 1 TABLET BY MOUTH DAILY 100 tablet 1   ketoconazole (NIZORAL) 2 % cream Apply 1 application topically at bedtime. 60 g 11   Misc Natural Products (LEG VEIN & CIRCULATION) TABS Take 2 capsules by mouth daily.     Multiple Vitamins-Calcium (ESSENTIAL ONE DAILY MULTIVIT) TABS Take by mouth.     Omega-3 Fatty Acids (FISH OIL) 1000 MG CAPS Take by mouth.     Tavaborole 5 % SOLN Apply 1 application topically at bedtime. 10 mL 11   No current facility-administered medications for this visit.    Allergies as of 12/02/2021   (No Known Allergies)    ROS:  General: Negative for anorexia, weight loss, fever, chills, fatigue, weakness. ENT: Negative for hoarseness, difficulty swallowing , nasal congestion. CV: Negative for chest pain, angina, palpitations, dyspnea on exertion, peripheral edema.  Respiratory: Negative for dyspnea at rest, dyspnea on exertion, cough, sputum, wheezing.  GI: See history of present illness. GU:  Negative for dysuria, hematuria, urinary incontinence, urinary frequency, nocturnal urination.  Endo: Negative for unusual weight change.    Physical Examination:   BP (!) 177/71 (BP Location: Left Arm, Patient Position: Sitting, Cuff Size: Normal)   Pulse  78   Ht 4\' 9"  (1.448 m)   Wt 156 lb 3.2 oz (70.9 kg)   BMI 33.80 kg/m   General: Well-nourished, well-developed in no acute distress.  Eyes: No icterus. Conjunctivae pink. Neuro: Alert and oriented x 3.  Grossly intact. Skin: Warm and dry, no jaundice.   Psych: Alert and cooperative, normal mood and affect.  Labs:    Imaging Studies: No results found.  Assessment and Plan:   Christina Salas is a 87 y.o. y/o female who comes in today with a history of iron deficiency with a stable hemoglobin.  The patient had an upper endoscopy and colonoscopy and capsule endoscopy with AVMs in the small bowel.  The patient  has been told that if her iron studies are keeping her blood count stable then she should continue the iron and should not undergo any further invasive procedures.  If she should continue to bleed and becomes symptomatic or need blood transfusions then we can revisit the issue of whether or not to subject this 87 year old woman to another invasive procedure.  The patient agrees with this plan and will contact me if her symptoms should get worse.  We will check her CBC to see if she is becoming anemic since she decided that the iron was causing her to have an upset stomach and diarrhea so she started taking it every other day with lessening of her symptoms.  The patient has been explained the plan and agrees with it.     Lucilla Lame, MD. Marval Regal    Note: This dictation was prepared with Dragon dictation along with smaller phrase technology. Any transcriptional errors that result from this process are unintentional.

## 2021-12-03 ENCOUNTER — Telehealth: Payer: Self-pay

## 2021-12-03 LAB — CBC
Hematocrit: 39.7 % (ref 34.0–46.6)
Hemoglobin: 13.2 g/dL (ref 11.1–15.9)
MCH: 29.7 pg (ref 26.6–33.0)
MCHC: 33.2 g/dL (ref 31.5–35.7)
MCV: 89 fL (ref 79–97)
Platelets: 181 10*3/uL (ref 150–450)
RBC: 4.44 x10E6/uL (ref 3.77–5.28)
RDW: 12.3 % (ref 11.7–15.4)
WBC: 4.7 10*3/uL (ref 3.4–10.8)

## 2021-12-03 LAB — IRON,TIBC AND FERRITIN PANEL
Ferritin: 52 ng/mL (ref 15–150)
Iron Saturation: 23 % (ref 15–55)
Iron: 84 ug/dL (ref 27–139)
Total Iron Binding Capacity: 368 ug/dL (ref 250–450)
UIBC: 284 ug/dL (ref 118–369)

## 2021-12-03 NOTE — Telephone Encounter (Signed)
-----   Message from Lucilla Lame, MD sent at 12/03/2021  7:02 AM EST ----- Please let the patient know that her Blood count and iron studies are normal.  She should continue doing what she is doing.

## 2021-12-03 NOTE — Telephone Encounter (Signed)
Pt notified of lab results

## 2021-12-09 DIAGNOSIS — D649 Anemia, unspecified: Secondary | ICD-10-CM | POA: Diagnosis not present

## 2021-12-09 DIAGNOSIS — K219 Gastro-esophageal reflux disease without esophagitis: Secondary | ICD-10-CM | POA: Diagnosis not present

## 2021-12-09 DIAGNOSIS — G629 Polyneuropathy, unspecified: Secondary | ICD-10-CM | POA: Diagnosis not present

## 2021-12-09 DIAGNOSIS — R0609 Other forms of dyspnea: Secondary | ICD-10-CM | POA: Diagnosis not present

## 2021-12-09 DIAGNOSIS — E782 Mixed hyperlipidemia: Secondary | ICD-10-CM | POA: Diagnosis not present

## 2021-12-09 DIAGNOSIS — R600 Localized edema: Secondary | ICD-10-CM | POA: Diagnosis not present

## 2021-12-09 DIAGNOSIS — I2510 Atherosclerotic heart disease of native coronary artery without angina pectoris: Secondary | ICD-10-CM | POA: Diagnosis not present

## 2021-12-09 DIAGNOSIS — I714 Abdominal aortic aneurysm, without rupture, unspecified: Secondary | ICD-10-CM | POA: Diagnosis not present

## 2021-12-09 DIAGNOSIS — I208 Other forms of angina pectoris: Secondary | ICD-10-CM | POA: Diagnosis not present

## 2021-12-10 ENCOUNTER — Other Ambulatory Visit: Payer: Self-pay | Admitting: Family Medicine

## 2021-12-10 DIAGNOSIS — G44209 Tension-type headache, unspecified, not intractable: Secondary | ICD-10-CM

## 2022-02-09 ENCOUNTER — Ambulatory Visit: Payer: Medicare PPO | Admitting: Dermatology

## 2022-02-11 ENCOUNTER — Telehealth (INDEPENDENT_AMBULATORY_CARE_PROVIDER_SITE_OTHER): Payer: Medicare PPO | Admitting: Internal Medicine

## 2022-02-11 DIAGNOSIS — M79604 Pain in right leg: Secondary | ICD-10-CM | POA: Diagnosis not present

## 2022-02-11 DIAGNOSIS — M7989 Other specified soft tissue disorders: Secondary | ICD-10-CM

## 2022-02-11 NOTE — Progress Notes (Signed)
Virtual Visit via Telephone Note  I connected with Christina Salas on 02/11/22 at 11:40 AM EST by telephone and verified that I am speaking with the correct person using two identifiers.  Location: Patient: Home  Provider: [facility]   I discussed the limitations, risks, security and privacy concerns of performing an evaluation and management service by telephone and the availability of in person appointments. I also discussed with the patient that there may be a patient responsible charge related to this service. The patient expressed understanding and agreed to proceed.   History of Present Illness:  Christina Salas is a 88 year old female presenting over the phone for right legs pain x 1.5 weeks. States she had just come back from church and was having right hip pain. She took a Tylenol and then noticed a burning type pain in her right calf under her knee. She does have a history of varicose veins and had been seeing a vascular surgeon previously. She was told to wear compression stockings in the past but she doesn't because they are too tight. She denies redness currently but states the leg was red a few days ago. She does have dry scaly skin. Her right leg is swollen but this comes and goes. She has a difficult time ambulating because of the pain. She's taking Tylenol and using a heat pad which helped somewhat. She denies wounds, shortness of breath, fevers.    Observations/Objective:  General: no acute distress Neuro: answers questions appropriately   Assessment and Plan:  1. Right leg pain/Right leg swelling: Physical exam severely limited over the phone but we will obtain an ultrasound of the leg to rule out acute DVT. Does sound like chronic lymphedema, discussed keeping the leg elevated and compression stockings to help with the swelling. Follow up next week for an in person visit and physical exam.   - US Venous Img Lower Unilateral Right; Future  Follow Up Instructions: 1 week    I  discussed the assessment and treatment plan with the patient. The patient was provided an opportunity to ask questions and all were answered. The patient agreed with the plan and demonstrated an understanding of the instructions.   The patient was advised to call back or seek an in-person evaluation if the symptoms worsen or if the condition fails to improve as anticipated.  I provided 15 minutes of non-face-to-face time during this encounter.   Teodora Medici, DO

## 2022-02-11 NOTE — Patient Instructions (Addendum)
It was great seeing you today!  Plan discussed at today's visit: -Ultrasound ordered  -Keep leg elevated -Please go to the emergency room if you develop a fever, shortness of breath or wounds or skin changes on the leg  Follow up in: 1 week   Take care and let us know if you have any questions or concerns prior to your next visit.  Dr. Rosana Berger

## 2022-02-12 ENCOUNTER — Ambulatory Visit: Payer: Medicare PPO | Admitting: Dermatology

## 2022-02-13 ENCOUNTER — Other Ambulatory Visit: Payer: Self-pay | Admitting: Internal Medicine

## 2022-02-13 ENCOUNTER — Other Ambulatory Visit: Payer: Self-pay | Admitting: Family Medicine

## 2022-02-13 DIAGNOSIS — G44209 Tension-type headache, unspecified, not intractable: Secondary | ICD-10-CM

## 2022-02-13 NOTE — Telephone Encounter (Signed)
Pt states she has run out, I do believe she is confused.  She states she was using for her leg pain, b/c baclofen knocks her out makes her real tried

## 2022-02-13 NOTE — Telephone Encounter (Signed)
Requested medication (s) are due for refill today: yes  Requested medication (s) are on the active medication list: yes  Last refill:  11/30/21 #20  Future visit scheduled: yes  Notes to clinic:  Please review for refill. Refill not delegated per protocol.     Requested Prescriptions  Pending Prescriptions Disp Refills   butalbital-acetaminophen-caffeine (FIORICET) 50-325-40 MG tablet [Pharmacy Med Name: BUT/ACETAMINOPHEN/CAFF 50-325-40 TB] 20 tablet     Sig: TAKE 1 TABLET BY MOUTH EVERY 6 HOURS AS NEEDED     Not Delegated - Analgesics:  Non-Opioid Analgesic Combinations 2 Failed - 02/13/2022  9:30 AM      Failed - This refill cannot be delegated      Passed - Cr in normal range and within 360 days    Creat  Date Value Ref Range Status  10/16/2021 0.70 0.60 - 0.95 mg/dL Final          Passed - eGFR is 10 or above and within 360 days    GFR, Est African American  Date Value Ref Range Status  10/22/2020 85 > OR = 60 mL/min/1.72m Final   GFR, Est Non African American  Date Value Ref Range Status  10/22/2020 73 > OR = 60 mL/min/1.735mFinal   eGFR  Date Value Ref Range Status  10/16/2021 85 > OR = 60 mL/min/1.7383minal    Comment:    The eGFR is based on the CKD-EPI 2021 equation. To calculate  the new eGFR from a previous Creatinine or Cystatin C result, go to https://www.kidney.org/professionals/ kdoqi/gfr%5Fcalculator           Passed - Patient is not pregnant      Passed - Valid encounter within last 12 months    Recent Outpatient Visits           2 days ago Right leg pain   [facility]   4 months ago [facility]   10 months [facility]   [facility]   1 year [facility]       Future Appointments             In 6 days AndTeodora MediciO [facility]  [facility] 2 months SowSteele SizerD [facility]In 3 months KowRalene BatheD [place]

## 2022-02-19 ENCOUNTER — Other Ambulatory Visit: Payer: Self-pay

## 2022-02-19 ENCOUNTER — Ambulatory Visit
Admission: RE | Admit: 2022-02-19 | Discharge: 2022-02-19 | Disposition: A | Discharge status: Home / Self Care | Payer: Medicare PPO | Source: Ambulatory Visit | Attending: Internal Medicine | Admitting: Internal Medicine

## 2022-02-19 ENCOUNTER — Ambulatory Visit: Payer: Medicare PPO

## 2022-02-19 ENCOUNTER — Ambulatory Visit: Payer: Medicare PPO | Admitting: Internal Medicine

## 2022-02-19 DIAGNOSIS — M79604 Pain in right leg: Secondary | ICD-10-CM | POA: Insufficient documentation

## 2022-02-19 DIAGNOSIS — M7989 Other specified soft tissue disorders: Secondary | ICD-10-CM | POA: Insufficient documentation

## 2022-02-20 ENCOUNTER — Ambulatory Visit (INDEPENDENT_AMBULATORY_CARE_PROVIDER_SITE_OTHER): Payer: Medicare PPO | Admitting: Internal Medicine

## 2022-02-20 ENCOUNTER — Encounter: Payer: Self-pay | Admitting: Internal Medicine

## 2022-02-20 VITALS — BP 142/74 | HR 95 | Temp 97.5°F | Resp 16 | Ht <= 58 in | Wt 148.5 lb

## 2022-02-20 DIAGNOSIS — M1990 Unspecified osteoarthritis, unspecified site: Secondary | ICD-10-CM

## 2022-02-20 NOTE — Patient Instructions (Signed)
It was great seeing you today!  Plan discussed at today's visit: -Try Voltaren gel on your legs and keep them well moisturized -Continue Tylenol as well  -Ultrasound negative for blood clot -No infection or swelling present today   Follow up in: keep follow up in April   Take care and let us know if you have any questions or concerns prior to your next visit.  Dr. Rosana Berger

## 2022-02-20 NOTE — Progress Notes (Signed)
Acute Office Visit  Subjective:    Patient ID: Christina Salas, female    DOB: 1933-03-12, 88 y.o.   MRN: [id number]  Chief Complaint  Patient presents with   Leg Pain    1 week f/u    HPI Patient is in today for follow up on right leg pain. Venous US 02/19/22 negative. Bilateral leg pain. She is accompanied today by her niece. Patient is a poor historian.   LEG PAIN Duration: 3 weeks, no trauma, woke up with aching in legs.  Pain: yes Severity: moderate  Quality:  aching and burning Location:  lower legs, R>L Bilateral:  yes Onset: sudden Frequency: intermittent Time of  day:   at random Alleviating factors: Tylenol sometimes  Aggravating factors: uncertain Status: fluctuating Treatments attempted: Tylenol   Past Medical History:  Diagnosis Date   Abnormal glucose    Anxiety    Arthritis    Breast cancer ([place]) 1996   Left- mastectomy   Cancer ([place])    melanoma   Chronic headaches    Depression    Dysplastic nevus 05/11/2007   right upper gastric area. Slight to moderate atypia.   Dysplastic nevus 06/24/2020   R lateral deltoid, moderate to severe atypia. Excised 08/13/2020, margins free.   GERD (gastroesophageal reflux disease)    H/O melanoma excision    History of diverticulitis    History of recurrent UTIs    Hypertension    Melanoma ([place])    scalp   Osteoporosis    pre-osetoporosis per patient history form 03/18/12   Pre-diabetes    Vitamin D deficiency     Past Surgical History:  Procedure Laterality Date   ABDOMINAL HYSTERECTOMY  2001   BREAST BIOPSY  2010   BREAST EXCISIONAL BIOPSY Right 1997   Benign excisional biopsy    CATARACT EXTRACTION Left 01/30/2021   Dr. Luberta Mutter   COLONOSCOPY  04/02/2014   COLONOSCOPY WITH PROPOFOL N/A 12/27/2018   Procedure: COLONOSCOPY WITH PROPOFOL;  Surgeon: Lucilla Lame, MD;  Location: [facility] ENDOSCOPY;  Service: Endoscopy;  Laterality: N/A;   CYSTOSCOPY  11/2014   ESOPHAGOGASTRODUODENOSCOPY (EGD)  WITH PROPOFOL N/A 12/27/2018   Procedure: ESOPHAGOGASTRODUODENOSCOPY (EGD) WITH PROPOFOL;  Surgeon: Lucilla Lame, MD;  Location: [facility] ENDOSCOPY;  Service: Endoscopy;  Laterality: N/A;   ESOPHAGOGASTRODUODENOSCOPY (EGD) WITH PROPOFOL N/A 01/07/2021   Procedure: ESOPHAGOGASTRODUODENOSCOPY (EGD) WITH PROPOFOL;  Surgeon: Lucilla Lame, MD;  Location: [facility] ENDOSCOPY;  Service: Endoscopy;  Laterality: N/A;   GIVENS CAPSULE STUDY N/A 01/07/2021   Procedure: GIVENS CAPSULE STUDY;  Surgeon: Lucilla Lame, MD;  Location: [facility] ENDOSCOPY;  Service: Endoscopy;  Laterality: N/A;   MASTECTOMY  1996   Left   MELANOMA EXCISION  1995   Head    Family History  Problem Relation Age of Onset   Breast cancer Mother    Migraines Mother    Diabetes Father    CAD Father    Thrombosis Father    Aortic aneurysm Sister    Biliary Cirrhosis Brother    Breast cancer Other        Niece    Social History   Socioeconomic History   Marital status: Single    Spouse name: Not on file   Number of children: 0   Years of education: Not on file   Highest education level: Master's degree (e.g., MA, MS, MEng, MEd, MSW, MBA)  Occupational History   Occupation: Retired  Tobacco Use   Smoking status: Never   Smokeless tobacco: Never  Vaping Use   Vaping Use: Never used  Substance and Sexual Activity   Alcohol use: No    Alcohol/week: 0.0 standard drinks   Drug use: Never   Sexual activity: Not Currently  Other Topics Concern   Not on file  Social History Narrative   She lives by herself   She has nieces and nephews in the area   She was a Pharmacist, hospital for over 4 years, retired in [place] Strain: Not on file  Food Insecurity: Not on file  Transportation Needs: Not on file  Physical Activity: Not on file  Stress: Not on file  Social Connections: Not on file  Intimate Partner Violence: Not on file    Outpatient Medications Prior to Visit  Medication Sig  Dispense Refill   baclofen (LIORESAL) 10 MG tablet Take 1 tablet (10 mg total) by mouth 3 (three) times daily as needed for muscle spasms. 90 each 0   butalbital-acetaminophen-caffeine (FIORICET) 50-325-40 MG tablet TAKE 1 TABLET BY MOUTH EVERY 6 HOURS AS NEEDED 20 tablet 0   Calcium-Magnesium 500-250 MG TABS Take 1 tablet by mouth 2 (two) times daily.     CINNAMON PO Take 1 capsule by mouth daily. 350 mg     Cranberry 500 MG CAPS Take 1 tablet by mouth daily.     esomeprazole (NEXIUM) 20 MG capsule Take 1 capsule (20 mg total) by mouth daily. 90 capsule 1   FEROSUL 325 (65 Fe) MG tablet TAKE 1 TABLET BY MOUTH DAILY 100 tablet 1   ketoconazole (NIZORAL) 2 % cream Apply 1 application topically at bedtime. 60 g 11   Misc Natural Products (LEG VEIN & CIRCULATION) TABS Take 2 capsules by mouth daily.     Multiple Vitamins-Calcium (ESSENTIAL ONE DAILY MULTIVIT) TABS Take by mouth.     Omega-3 Fatty Acids (FISH OIL) 1000 MG CAPS Take by mouth.     Tavaborole 5 % SOLN Apply 1 application topically at bedtime. 10 mL 11   No facility-administered medications prior to visit.    No Known Allergies  Review of Systems  Constitutional:  Negative for chills and fever.  Cardiovascular:  Negative for leg swelling.  Musculoskeletal:  Positive for arthralgias. Negative for back pain, gait problem and joint swelling.  Skin: Negative.       Objective:    Physical Exam Constitutional:      Appearance: Normal appearance.  HENT:     Head: Normocephalic and atraumatic.  Eyes:     Conjunctiva/sclera: Conjunctivae normal.  Cardiovascular:     Rate and Rhythm: Normal rate and regular rhythm.  Pulmonary:     Effort: Pulmonary effort is normal.     Breath sounds: Normal breath sounds.  Musculoskeletal:        General: Tenderness present.     Right lower leg: No edema.     Left lower leg: No edema.     Comments: Generalized tenderness to palpation along calves bilaterally, no skin changes or swelling   Skin:    General: Skin is warm and dry.  Neurological:     General: No focal deficit present.     Mental Status: She is alert. Mental status is at baseline.  Psychiatric:        Mood and Affect: Mood normal.        Behavior: Behavior normal.    BP (!) 142/74    Pulse 95    Temp (!) 97.5 F (36.4  C)    Resp 16    Ht _0  (1.448 m)    Wt 148 lb 8 oz (67.4 kg)    SpO2 97%    BMI 32.14 kg/m  Wt Readings from Last 3 Encounters:  12/02/21 156 lb 3.2 oz (70.9 kg)  10/15/21 152 lb 8 oz (69.2 kg)  04/15/21 154 lb (69.9 kg)    Health Maintenance Due  Topic Date Due   Zoster Vaccines- Shingrix (1 of 2) Never done   TETANUS/TDAP  12/03/2020   COVID-19 Vaccine (4 - Booster for Pfizer series) 12/23/2020    There are no preventive care reminders to display for this patient.   Lab Results  Component Value Date   TSH 2.52 10/22/2020   Lab Results  Component Value Date   WBC 4.7 12/02/2021   HGB 13.2 12/02/2021   HCT 39.7 12/02/2021   MCV 89 12/02/2021   PLT 181 12/02/2021   Lab Results  Component Value Date   NA 144 10/16/2021   K 3.7 10/16/2021   CO2 26 10/16/2021   GLUCOSE 94 10/16/2021   BUN 16 10/16/2021   CREATININE 0.70 10/16/2021   BILITOT 0.5 10/16/2021   ALKPHOS 67 06/19/2016   AST 14 10/16/2021   ALT 8 10/16/2021   PROT 6.8 10/16/2021   ALBUMIN 4.0 06/19/2016   CALCIUM 9.6 10/16/2021   ANIONGAP 6 (L) 02/24/2013   EGFR 85 10/16/2021   Lab Results  Component Value Date   CHOL 180 10/16/2021   Lab Results  Component Value Date   HDL 55 10/16/2021   Lab Results  Component Value Date   LDLCALC 98 10/16/2021   Lab Results  Component Value Date   TRIG 174 (H) 10/16/2021   Lab Results  Component Value Date   CHOLHDL 3.3 10/16/2021   Lab Results  Component Value Date   HGBA1C 5.5 10/16/2021       Assessment & Plan:   1. Arthritis: [place] negative, no skin changes or swelling present today. No history of trauma. Tylenol helps but does have a  history of GI bleed in the past, avoid oral anti-inflammatories. Recommend conservative treatments, topical anti-inflammatories as needed.    Teodora Medici, DO

## 2022-03-04 ENCOUNTER — Telehealth: Payer: Self-pay | Admitting: Family Medicine

## 2022-03-04 NOTE — Telephone Encounter (Signed)
Copied from [place] [phone]. Topic: Medicare AWV ?>> Mar 04, 2022  1:57 PM Cher Nakai R wrote: ?Reason for CRM:  ?Left message for patient to call back and schedule Medicare Annual Wellness Visit (AWV) in office.  ? ?If unable to come into the office for AWV,  please offer to do virtually or by telephone. ? ?Last AWV:  02/18/2021 ? ?Please schedule at anytime with [place]. ? ?40 minute appointment ? ?Any questions, please contact me at [phone] ?

## 2022-03-09 DIAGNOSIS — M79675 Pain in left toe(s): Secondary | ICD-10-CM | POA: Diagnosis not present

## 2022-03-09 DIAGNOSIS — L851 Acquired keratosis [keratoderma] palmaris et plantaris: Secondary | ICD-10-CM | POA: Diagnosis not present

## 2022-03-09 DIAGNOSIS — M79674 Pain in right toe(s): Secondary | ICD-10-CM | POA: Diagnosis not present

## 2022-03-09 DIAGNOSIS — R7303 Prediabetes: Secondary | ICD-10-CM | POA: Diagnosis not present

## 2022-03-09 DIAGNOSIS — B351 Tinea unguium: Secondary | ICD-10-CM | POA: Diagnosis not present

## 2022-03-20 ENCOUNTER — Other Ambulatory Visit: Payer: Self-pay | Admitting: Family Medicine

## 2022-03-20 DIAGNOSIS — G44209 Tension-type headache, unspecified, not intractable: Secondary | ICD-10-CM

## 2022-03-24 ENCOUNTER — Ambulatory Visit (INDEPENDENT_AMBULATORY_CARE_PROVIDER_SITE_OTHER): Payer: Medicare PPO

## 2022-03-24 DIAGNOSIS — Z0000 Encounter for general adult medical examination without abnormal findings: Secondary | ICD-10-CM | POA: Diagnosis not present

## 2022-03-24 DIAGNOSIS — Z780 Asymptomatic menopausal state: Secondary | ICD-10-CM

## 2022-03-24 DIAGNOSIS — Z1231 Encounter for screening mammogram for malignant neoplasm of breast: Secondary | ICD-10-CM

## 2022-03-24 NOTE — Patient Instructions (Signed)
Christina Salas , ?Thank you for taking time to come for your Medicare Wellness Visit. I appreciate your ongoing commitment to your health goals. Please review the following plan we discussed and let me know if I can assist you in the future.  ? ?Screening recommendations/referrals: ?Colonoscopy: no longer required ?Mammogram: done 07/10/21. Please call [phone] to schedule your mammogram and bone density screening.  ?Bone Density: done 06/23/19 ?Recommended yearly ophthalmology/optometry visit for glaucoma screening and checkup ?Recommended yearly dental visit for hygiene and checkup ? ?Vaccinations: ?Influenza vaccine: done 10/15/21 ?Pneumococcal vaccine: done 12/03/14 ?Tdap vaccine: due ?Shingles vaccine: Shingrix discussed. Please contact your pharmacy for coverage information.  ?Covid-19: done 01/23/20, 02/13/20 & 10/28/20 ? ?Advanced directives: Advance directive discussed with you today. Even though you declined this today please call our office should you change your mind and we can give you the proper paperwork for you to fill out.  ? ?Conditions/risks identified: Recommend drinking 6-8 glasses of water per day  ? ?Next appointment: Follow up in one year for your annual wellness visit  ? ? ?Preventive Care 75 Years and Older, Female ?Preventive care refers to lifestyle choices and visits with your health care provider that can promote health and wellness. ?What does preventive care include? ?A yearly physical exam. This is also called an annual well check. ?Dental exams once or twice a year. ?Routine eye exams. Ask your health care provider how often you should have your eyes checked. ?Personal lifestyle choices, including: ?Daily care of your teeth and gums. ?Regular physical activity. ?Eating a healthy diet. ?Avoiding tobacco and drug use. ?Limiting alcohol use. ?Practicing safe sex. ?Taking low-dose aspirin every day. ?Taking vitamin and mineral supplements as recommended by your health care provider. ?What  happens during an annual well check? ?The services and screenings done by your health care provider during your annual well check will depend on your age, overall health, lifestyle risk factors, and family history of disease. ?Counseling  ?Your health care provider may ask you questions about your: ?Alcohol use. ?Tobacco use. ?Drug use. ?Emotional well-being. ?Home and relationship well-being. ?Sexual activity. ?Eating habits. ?History of falls. ?Memory and ability to understand (cognition). ?Work and work Statistician. ?Reproductive health. ?Screening  ?You may have the following tests or measurements: ?Height, weight, and BMI. ?Blood pressure. ?Lipid and cholesterol levels. These may be checked every 5 years, or more frequently if you are over 45 years old. ?Skin check. ?Lung cancer screening. You may have this screening every year starting at age 20 if you have a 30-pack-year history of smoking and currently smoke or have quit within the past 15 years. ?Fecal occult blood test (FOBT) of the stool. You may have this test every year starting at age 57. ?Flexible sigmoidoscopy or colonoscopy. You may have a sigmoidoscopy every 5 years or a colonoscopy every 10 years starting at age 37. ?Hepatitis C blood test. ?Hepatitis B blood test. ?Sexually transmitted disease (STD) testing. ?Diabetes screening. This is done by checking your blood sugar (glucose) after you have not eaten for a while (fasting). You may have this done every 1-3 years. ?Bone density scan. This is done to screen for osteoporosis. You may have this done starting at age 40. ?Mammogram. This may be done every 1-2 years. Talk to your health care provider about how often you should have regular mammograms. ?Talk with your health care provider about your test results, treatment options, and if necessary, the need for more tests. ?Vaccines  ?Your health care provider may recommend  certain vaccines, such as: ?Influenza vaccine. This is recommended every  year. ?Tetanus, diphtheria, and acellular pertussis (Tdap, Td) vaccine. You may need a Td booster every 10 years. ?Zoster vaccine. You may need this after age 67. ?Pneumococcal 13-valent conjugate (PCV13) vaccine. One dose is recommended after age 8. ?Pneumococcal polysaccharide (PPSV23) vaccine. One dose is recommended after age 23. ?Talk to your health care provider about which screenings and vaccines you need and how often you need them. ?This information is not intended to replace advice given to you by your health care provider. Make sure you discuss any questions you have with your health care provider. ?Document Released: 01/10/2016 Document Revised: 09/02/2016 Document Reviewed: 10/15/2015 ?Elsevier Interactive Patient Education ? 2017 [place]. ? ?Fall Prevention in the Home ?Falls can cause injuries. They can happen to people of all ages. There are many things you can do to make your home safe and to help prevent falls. ?What can I do on the outside of my home? ?Regularly fix the edges of walkways and driveways and fix any cracks. ?Remove anything that might make you trip as you walk through a door, such as a raised step or threshold. ?Trim any bushes or trees on the path to your home. ?Use bright outdoor lighting. ?Clear any walking paths of anything that might make someone trip, such as rocks or tools. ?Regularly check to see if handrails are loose or broken. Make sure that both sides of any steps have handrails. ?Any raised decks and porches should have guardrails on the edges. ?Have any leaves, snow, or ice cleared regularly. ?Use sand or salt on walking paths during winter. ?Clean up any spills in your garage right away. This includes oil or grease spills. ?What can I do in the bathroom? ?Use night lights. ?Install grab bars by the toilet and in the tub and shower. Do not use towel bars as grab bars. ?Use non-skid mats or decals in the tub or shower. ?If you need to sit down in the shower, use a  plastic, non-slip stool. ?Keep the floor dry. Clean up any water that spills on the floor as soon as it happens. ?Remove soap buildup in the tub or shower regularly. ?Attach bath mats securely with double-sided non-slip rug tape. ?Do not have throw rugs and other things on the floor that can make you trip. ?What can I do in the bedroom? ?Use night lights. ?Make sure that you have a light by your bed that is easy to reach. ?Do not use any sheets or blankets that are too big for your bed. They should not hang down onto the floor. ?Have a firm chair that has side arms. You can use this for support while you get dressed. ?Do not have throw rugs and other things on the floor that can make you trip. ?What can I do in the kitchen? ?Clean up any spills right away. ?Avoid walking on wet floors. ?Keep items that you use a lot in easy-to-reach places. ?If you need to reach something above you, use a strong step stool that has a grab bar. ?Keep electrical cords out of the way. ?Do not use floor polish or wax that makes floors slippery. If you must use wax, use non-skid floor wax. ?Do not have throw rugs and other things on the floor that can make you trip. ?What can I do with my stairs? ?Do not leave any items on the stairs. ?Make sure that there are handrails on both sides of the  stairs and use them. Fix handrails that are broken or loose. Make sure that handrails are as long as the stairways. ?Check any carpeting to make sure that it is firmly attached to the stairs. Fix any carpet that is loose or worn. ?Avoid having throw rugs at the top or bottom of the stairs. If you do have throw rugs, attach them to the floor with carpet tape. ?Make sure that you have a light switch at the top of the stairs and the bottom of the stairs. If you do not have them, ask someone to add them for you. ?What else can I do to help prevent falls? ?Wear shoes that: ?Do not have high heels. ?Have rubber bottoms. ?Are comfortable and fit you  well. ?Are closed at the toe. Do not wear sandals. ?If you use a stepladder: ?Make sure that it is fully opened. Do not climb a closed stepladder. ?Make sure that both sides of the stepladder are locked into place.

## 2022-03-24 NOTE — Progress Notes (Signed)
? ?Subjective:  ? Christina Salas is a 88 y.o. female who presents for Medicare Annual (Subsequent) preventive examination. ? ?Virtual Visit via Telephone Note ? ?I connected with  Christina Salas on 03/24/22 at 10:30 AM EDT by telephone and verified that I am speaking with the correct person using two identifiers. ? ?Location: ?Patient: home ?Provider: [facility] ?Persons participating in the virtual visit: patient/Nurse Health Advisor ?  ?I discussed the limitations, risks, security and privacy concerns of performing an evaluation and management service by telephone and the availability of in person appointments. The patient expressed understanding and agreed to proceed. ? ?Interactive audio and video telecommunications were attempted between this nurse and patient, however failed, due to patient having technical difficulties OR patient did not have access to video capability.  We continued and completed visit with audio only. ? ?Some vital signs may be absent or patient reported.  ? ?[name], LPN ? ? ?Review of Systems    ? ?Cardiac Risk Factors include: advanced age (>76mn, >>60women);dyslipidemia;sedentary lifestyle ? ?   ?Objective:  ?  ?There were no vitals filed for this visit. ?There is no height or weight on file to calculate BMI. ? ? ?  03/24/2022  ? 10:50 AM 02/18/2021  ?  3:45 PM 01/07/2021  ?  7:26 AM 01/30/2020  ?  3:05 PM 01/24/2019  ?  9:26 AM 12/27/2018  ?  7:50 AM 07/13/2017  ?  3:39 PM  ?Advanced Directives  ?Does Patient Have a Medical Advance Directive? No No No No No No No  ?Would patient like information on creating a medical advance directive? No - Patient declined No - Patient declined  Yes (MAU/Ambulatory/Procedural Areas - Information given) Yes (MAU/Ambulatory/Procedural Areas - Information given)    ? ? ?Current Medications (verified) ?Outpatient Encounter Medications as of 03/24/2022  ?Medication Sig  ? butalbital-acetaminophen-caffeine (FIORICET) 50-325-40 MG tablet TAKE 1 TABLET BY  MOUTH EVERY 6 HOURS AS NEEDED  ? Calcium-Magnesium 500-250 MG TABS Take 1 tablet by mouth 2 (two) times daily.  ? CINNAMON PO Take 1 capsule by mouth daily. 350 mg  ? Cranberry 500 MG CAPS Take 1 tablet by mouth daily.  ? esomeprazole (NEXIUM) 20 MG capsule Take 1 capsule (20 mg total) by mouth daily.  ? FEROSUL 325 (65 Fe) MG tablet TAKE 1 TABLET BY MOUTH DAILY  ? Misc Natural Products (LEG VEIN & CIRCULATION) TABS Take 2 capsules by mouth daily.  ? Multiple Vitamins-Calcium (ESSENTIAL ONE DAILY MULTIVIT) TABS Take by mouth.  ? Omega-3 Fatty Acids (FISH OIL) 1000 MG CAPS Take by mouth.  ? [DISCONTINUED] baclofen (LIORESAL) 10 MG tablet Take 1 tablet (10 mg total) by mouth 3 (three) times daily as needed for muscle spasms.  ? [DISCONTINUED] ketoconazole (NIZORAL) 2 % cream Apply 1 application topically at bedtime.  ? [DISCONTINUED] Tavaborole 5 % SOLN Apply 1 application topically at bedtime.  ? ?No facility-administered encounter medications on file as of 03/24/2022.  ? ? ?Allergies (verified) ?Patient has no known allergies.  ? ?History: ?Past Medical History:  ?Diagnosis Date  ? Abnormal glucose   ? Anxiety   ? Arthritis   ? Breast cancer ([facility] 1996  ? Left- mastectomy  ? Cancer ([facility]   ? melanoma  ? Chronic headaches   ? Depression   ? Dysplastic nevus 05/11/2007  ? right upper gastric area. Slight to moderate atypia.  ? Dysplastic nevus 06/24/2020  ? R lateral deltoid, moderate to severe atypia. Excised 08/13/2020, margins free.  ?  GERD (gastroesophageal reflux disease)   ? H/O melanoma excision   ? History of diverticulitis   ? History of recurrent UTIs   ? Hypertension   ? Melanoma ([place])   ? scalp  ? Osteoporosis   ? pre-osetoporosis per patient history form 03/18/12  ? Pre-diabetes   ? Vitamin D deficiency   ? ?Past Surgical History:  ?Procedure Laterality Date  ? ABDOMINAL HYSTERECTOMY  2001  ? BREAST BIOPSY  2010  ? BREAST EXCISIONAL BIOPSY Right 1997  ? Benign excisional biopsy   ? CATARACT EXTRACTION Left  01/30/2021  ? Dr. Luberta Mutter  ? COLONOSCOPY  04/02/2014  ? COLONOSCOPY WITH PROPOFOL N/A 12/27/2018  ? Procedure: COLONOSCOPY WITH PROPOFOL;  Surgeon: Lucilla Lame, MD;  Location: [facility] ENDOSCOPY;  Service: Endoscopy;  Laterality: N/A;  ? CYSTOSCOPY  11/2014  ? ESOPHAGOGASTRODUODENOSCOPY (EGD) WITH PROPOFOL N/A 12/27/2018  ? Procedure: ESOPHAGOGASTRODUODENOSCOPY (EGD) WITH PROPOFOL;  Surgeon: Lucilla Lame, MD;  Location: [facility] ENDOSCOPY;  Service: Endoscopy;  Laterality: N/A;  ? ESOPHAGOGASTRODUODENOSCOPY (EGD) WITH PROPOFOL N/A 01/07/2021  ? Procedure: ESOPHAGOGASTRODUODENOSCOPY (EGD) WITH PROPOFOL;  Surgeon: Lucilla Lame, MD;  Location: [facility] ENDOSCOPY;  Service: Endoscopy;  Laterality: N/A;  ? GIVENS CAPSULE STUDY N/A 01/07/2021  ? Procedure: GIVENS CAPSULE STUDY;  Surgeon: Lucilla Lame, MD;  Location: [facility] ENDOSCOPY;  Service: Endoscopy;  Laterality: N/A;  ? MASTECTOMY  1996  ? Left  ? [place]  ? Head  ? ?Family History  ?Problem Relation Age of Onset  ? Breast cancer Mother   ? Migraines Mother   ? Diabetes Father   ? CAD Father   ? Thrombosis Father   ? Aortic aneurysm Sister   ? Biliary Cirrhosis Brother   ? Breast cancer Other   ?     Niece  ? ?Social History  ? ?Socioeconomic History  ? Marital status: Single  ?  Spouse name: Not on file  ? Number of children: 0  ? Years of education: Not on file  ? Highest education level: Master's degree (e.g., MA, MS, MEng, MEd, MSW, MBA)  ?Occupational History  ? Occupation: Retired  ?Tobacco Use  ? Smoking status: Never  ? Smokeless tobacco: Never  ?Vaping Use  ? Vaping Use: Never used  ?Substance and Sexual Activity  ? Alcohol use: No  ?  Alcohol/week: 0.0 standard drinks  ? Drug use: Never  ? Sexual activity: Not Currently  ?Other Topics Concern  ? Not on file  ?Social History Narrative  ? She lives by herself  ? She has nieces and nephews in the area  ? 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 ?Social Determinants of Health   ? ?Financial Resource Strain: Low Risk   ? Difficulty of Paying Living Expenses: Not hard at all  ?Food Insecurity: No Food Insecurity  ? Worried About Charity fundraiser in the Last Year: Never true  ? Ran Out of Food in the Last Year: Never true  ?Transportation Needs: No Transportation Needs  ? Lack of Transportation (Medical): No  ? Lack of Transportation (Non-Medical): No  ?Physical Activity: Inactive  ? Days of Exercise per Week: 0 days  ? Minutes of Exercise per Session: 0 min  ?Stress: No Stress Concern Present  ? Feeling of Stress : Not at all  ?Social Connections: Moderately Isolated  ? Frequency of Communication with Friends and Family: More than three times a week  ? Frequency of Social Gatherings with Friends and  Family: Three times a week  ? Attends Religious Services: More than 4 times per year  ? Active Member of Clubs or Organizations: No  ? Attends Archivist Meetings: Never  ? Marital Status: Never married  ? ? ?Tobacco Counseling ?Counseling given: Not Answered ? ? ?Clinical Intake: ? ?Pre-visit preparation completed: Yes ? ?Pain : No/denies pain ? ?  ? ?Nutritional Risks: None ?Diabetes: No ? ?How often do you need to have someone help you when you read instructions, pamphlets, or other written materials from your doctor or pharmacy?: 1 - Never ? ? ? ?Interpreter Needed?: No ? ?Information entered by :: [name] LPN ? ? ?Activities of Daily Living ? ?  03/24/2022  ? 10:52 AM 02/20/2022  ?  3:23 PM  ?In your present state of health, do you have any difficulty performing the following activities:  ?Hearing? 1 1  ?Vision? 1 1  ?Difficulty concentrating or making decisions? 1 1  ?Walking or climbing stairs? 1 1  ?Dressing or bathing? 0 0  ?Doing errands, shopping? 1 1  ?Preparing Food and eating ? N   ?Using the Toilet? N   ?In the past six months, have you accidently leaked urine? Y   ?Comment wears pads for protection   ?Do you have problems with loss of bowel control? N   ?Managing  your Medications? N   ?Managing your Finances? N   ?Housekeeping or managing your Housekeeping? N   ? ? ?Patient Care Team: ?Steele Sizer, MD as PCP - General (Family Medicine) ?Algernon Huxley, MD as Consulting Phy

## 2022-03-31 DIAGNOSIS — H52203 Unspecified astigmatism, bilateral: Secondary | ICD-10-CM | POA: Diagnosis not present

## 2022-03-31 DIAGNOSIS — H02403 Unspecified ptosis of bilateral eyelids: Secondary | ICD-10-CM | POA: Diagnosis not present

## 2022-03-31 DIAGNOSIS — Z961 Presence of intraocular lens: Secondary | ICD-10-CM | POA: Diagnosis not present

## 2022-04-14 NOTE — Progress Notes (Signed)
Name: Christina Salas   MRN: [id number]    DOB: 1933/11/14   Date:04/15/2022 ? ?     Progress Note ? ?Subjective ? ?Chief Complaint ? ?Follow Up ? ?HPI ? ?  ?Cervical Radiculopathy: she  states pain has improved with baclofen prn, still feels stiff at times but pain is not as intense, and occasionally tingling down to right shoulder.She will let us know when she decides to see Dr. Vertell Limber at [facility] ( she has never seen him but her niece had neck surgery done by him) She stopped baclofen because of sedation, she was given lyrica in the past but decided not to take it  ?  ?Tension headache: she is taking fioricet prn and needs a refill, she describes headaches as dull and or sharp, not as frequent now, she states baclofen also helps when she has a headache but causes drowsiness . Usually frontal, very seldom she has associated  nausea or vomiting.. She usually takes Fioricet 5-10 times per month and 60 pills should last her over 6 months.  ? ?Iron deficiency:She was seen by Dr. Durwin Reges back in 11/2018, had EGD and colonoscopy , she also had multiple AVM of small bowel , Dr. Durwin Reges discussed either push enteroscopy versus double balloon enteroscopy, or continue monitoring for anemia and take iron supplementation. She chose the later and last levels were back to normal . Last HCT was normal and iron studies back to normal also  ?  ?Osteopenia: she refuses to go back on Alendronate, she is worried about side effects. Discussed high calcium diet and vitamin D 2000 units daily . Last vitamin D was at goal, bone density ordered for this Summer  ?  ?Mild Depression: she has long history of depression, she states not feeling great right now, states life is boring, denies suicidal thoughts or ideation, but has lack of energy and also lack of appetite She states her nephew is 33 yo and diagnosed with cancer and he is worried about him  ? ?Pre-diabetes: she denies polyphagia or polyuria, she feels her mouth gets dry at  times. Last A1C was in the pre -diabetes range. ?  ?GERD: controlled with Nexium at this time, she is aware of long term side effects. She denies heart burn or indigestion . She is under the care of Dr. Allen Norris , she needs refill of Nexium  ? ?Malnutrition: she lost 20 lbs from pre-pandemic to last year, she continues to lose weight, down another 6 lbs since last visit. Her baseline weight is 180 lbs and she is down to 146 lbs. . She states she eats small amounts of food, she has seen GI but continues to have constipation and takes stool softeners daily . Discussed high calorie diet . She sees a Paediatric nurse yearly, she is up to date with GI and will have mammogram soon.  ? ?Kyphosis: she is worried about her posture, she has a history of scoliosis, uses a cane, and has problems walking, discussed PT but she wants to resume home exercise first  ? ?Angina: she sees Dr. Clayborn Bigness. She states only has symptoms occasionally.  ? ?Echo from 2017  ?NORMAL LEFT VENTRICULAR SYSTOLIC FUNCTION WITH AN ESTIMATED EF = >55 %  ?NORMAL RIGHT VENTRICULAR SYSTOLIC FUNCTION  ?MILD TRICUSPID AND MITRAL VALVE INSUFFICIENCY  ?TRACE AORTIC VALVE INSUFFICIENCY  ?NO VALVULAR STENOSIS  ?MILD BIATRIAL ENLARGEMENT  ? ?ECG Interpretation: 2017  ?Rest ECG:  normal sinus rhythm, none  ?Stress ECG:  normal sinus rhythm,  ?  Recovery ECG:  normal sinus rhythm  ?ECG Interpretation:  non-diagnostic due to pharmacologic testing ? ?Neuropathy:  seen by Dr. Rosana Berger, she states sometimes burning and tingling /numbness she tried topical medications, she states pain is not as severe now. Discussed Lyrica or Gabapentin but she prefers holding off for now It can be on hands and also foot and legs Discussed getting B12 and folate levels  ? ?Vitamin D :she is taking 2000 units daily  and last level was at goal  ? ?Patient Active Problem List  ? Diagnosis Date Noted  ? Stricture and stenosis of esophagus   ? Mild depression 01/24/2019  ? Large hiatal hernia  01/24/2019  ? Internal hemorrhoids 01/24/2019  ? Iron deficiency anemia due to chronic blood loss   ? Venous reflux 04/19/2018  ? Swelling of limb 04/27/2017  ? Prediabetes 06/08/2016  ? Obesity (BMI 30.0-34.9) 06/08/2016  ? Vitamin D deficiency 06/08/2016  ? Post-menopausal 12/11/2015  ? DD (diverticular disease) 06/19/2015  ? Edema extremities 06/19/2015  ? Esophageal reflux 06/19/2015  ? HLD (hyperlipidemia) 06/19/2015  ? Varicose veins of leg with swelling, bilateral 06/19/2015  ? Recurrent UTI 02/08/2013  ? Mixed incontinence 02/08/2013  ? History of breast cancer, left, T1, N0, Left MRM - Dec 1996 03/18/2012  ? Tension headache 05/24/2007  ? History of melanoma 08/28/1994  ? ? ?Past Surgical History:  ?Procedure Laterality Date  ? ABDOMINAL HYSTERECTOMY  2001  ? BREAST BIOPSY  2010  ? BREAST EXCISIONAL BIOPSY Right 1997  ? Benign excisional biopsy   ? CATARACT EXTRACTION Left 01/30/2021  ? Dr. Luberta Mutter  ? COLONOSCOPY  04/02/2014  ? COLONOSCOPY WITH PROPOFOL N/A 12/27/2018  ? Procedure: COLONOSCOPY WITH PROPOFOL;  Surgeon: Lucilla Lame, MD;  Location: [facility] ENDOSCOPY;  Service: Endoscopy;  Laterality: N/A;  ? CYSTOSCOPY  11/2014  ? ESOPHAGOGASTRODUODENOSCOPY (EGD) WITH PROPOFOL N/A 12/27/2018  ? Procedure: ESOPHAGOGASTRODUODENOSCOPY (EGD) WITH PROPOFOL;  Surgeon: Lucilla Lame, MD;  Location: [facility] ENDOSCOPY;  Service: Endoscopy;  Laterality: N/A;  ? ESOPHAGOGASTRODUODENOSCOPY (EGD) WITH PROPOFOL N/A 01/07/2021  ? Procedure: ESOPHAGOGASTRODUODENOSCOPY (EGD) WITH PROPOFOL;  Surgeon: Lucilla Lame, MD;  Location: [facility] ENDOSCOPY;  Service: Endoscopy;  Laterality: N/A;  ? GIVENS CAPSULE STUDY N/A 01/07/2021  ? Procedure: GIVENS CAPSULE STUDY;  Surgeon: Lucilla Lame, MD;  Location: [facility] ENDOSCOPY;  Service: Endoscopy;  Laterality: N/A;  ? MASTECTOMY  1996  ? Left  ? [place]  ? Head  ? ? ?Family History  ?Problem Relation Age of Onset  ? Breast cancer Mother   ? Migraines Mother   ? Diabetes Father    ? CAD Father   ? Thrombosis Father   ? Aortic aneurysm Sister   ? Biliary Cirrhosis Brother   ? Breast cancer Other   ?     Niece  ? ? ?Social History  ? ?Tobacco Use  ? Smoking status: Never  ? Smokeless tobacco: Never  ?Substance Use Topics  ? Alcohol use: No  ?  Alcohol/week: 0.0 standard drinks  ? ? ? ?Current Outpatient Medications:  ?  butalbital-acetaminophen-caffeine (FIORICET) 50-325-40 MG tablet, TAKE 1 TABLET BY MOUTH EVERY 6 HOURS AS NEEDED, Disp: 5 tablet, Rfl: 0 ?  Calcium-Magnesium 500-250 MG TABS, Take 1 tablet by mouth 2 (two) times daily., Disp: , Rfl:  ?  CINNAMON PO, Take 1 capsule by mouth daily. 350 mg, Disp: , Rfl:  ?  Cranberry 500 MG CAPS, Take 1 tablet by mouth daily., Disp: , Rfl:  ?  esomeprazole (NEXIUM) 20 MG capsule, Take 1 capsule (20 mg total) by mouth daily., Disp: 90 capsule, Rfl: 1 ?  FEROSUL 325 (65 Fe) MG tablet, TAKE 1 TABLET BY MOUTH DAILY, Disp: 100 tablet, Rfl: 1 ?  Misc Natural Products (LEG VEIN & CIRCULATION) TABS, Take 2 capsules by mouth daily., Disp: , Rfl:  ?  Multiple Vitamins-Calcium (ESSENTIAL ONE DAILY MULTIVIT) TABS, Take by mouth., Disp: , Rfl:  ?  Omega-3 Fatty Acids (FISH OIL) 1000 MG CAPS, Take by mouth., Disp: , Rfl:  ? ?No Known Allergies ? ?I personally reviewed active problem list, medication list, allergies, family history, social history, health maintenance with the patient/caregiver today. ? ? ?ROS ? ?Constitutional: Negative for fever, positive for  weight change.  ?Respiratory: Negative for cough and shortness of breath.   ?Cardiovascular: Negative for chest pain or palpitations.  ?Gastrointestinal: Negative for abdominal pain, no bowel changes.  ?Musculoskeletal: positive for gait problem or joint swelling.  ?Skin: Negative for rash.  ?Neurological: Negative for dizziness or headache.  ?No other specific complaints in a complete review of systems (except as listed in HPI above).  ? ?Objective ? ?Vitals:  ? 04/15/22 1356  ?BP: 136/82  ?Pulse: 92   ?Resp: 16  ?SpO2: 96%  ?Weight: 146 lb (66.2 kg)  ?Height: '5\' 1"'$  (1.549 m)  ? ? ?Body mass index is 27.59 kg/m?. ? ?Physical Exam ? ?Constitutional: Patient appears well-developed and malnourished  No distress.

## 2022-04-15 ENCOUNTER — Encounter: Payer: Self-pay | Admitting: Family Medicine

## 2022-04-15 ENCOUNTER — Ambulatory Visit: Payer: Medicare PPO | Admitting: Family Medicine

## 2022-04-15 VITALS — BP 136/82 | HR 92 | Resp 16 | Ht 61.0 in | Wt 146.0 lb

## 2022-04-15 DIAGNOSIS — M5412 Radiculopathy, cervical region: Secondary | ICD-10-CM

## 2022-04-15 DIAGNOSIS — G44209 Tension-type headache, unspecified, not intractable: Secondary | ICD-10-CM

## 2022-04-15 DIAGNOSIS — K219 Gastro-esophageal reflux disease without esophagitis: Secondary | ICD-10-CM

## 2022-04-15 DIAGNOSIS — K5520 Angiodysplasia of colon without hemorrhage: Secondary | ICD-10-CM | POA: Diagnosis not present

## 2022-04-15 DIAGNOSIS — Z862 Personal history of diseases of the blood and blood-forming organs and certain disorders involving the immune mechanism: Secondary | ICD-10-CM | POA: Diagnosis not present

## 2022-04-15 DIAGNOSIS — I208 Other forms of angina pectoris: Secondary | ICD-10-CM

## 2022-04-15 DIAGNOSIS — K449 Diaphragmatic hernia without obstruction or gangrene: Secondary | ICD-10-CM

## 2022-04-15 DIAGNOSIS — Z780 Asymptomatic menopausal state: Secondary | ICD-10-CM

## 2022-04-15 DIAGNOSIS — R739 Hyperglycemia, unspecified: Secondary | ICD-10-CM | POA: Diagnosis not present

## 2022-04-15 DIAGNOSIS — R7303 Prediabetes: Secondary | ICD-10-CM | POA: Diagnosis not present

## 2022-04-15 DIAGNOSIS — M8580 Other specified disorders of bone density and structure, unspecified site: Secondary | ICD-10-CM | POA: Diagnosis not present

## 2022-04-15 DIAGNOSIS — F32A Depression, unspecified: Secondary | ICD-10-CM

## 2022-04-15 DIAGNOSIS — G629 Polyneuropathy, unspecified: Secondary | ICD-10-CM

## 2022-04-15 DIAGNOSIS — E559 Vitamin D deficiency, unspecified: Secondary | ICD-10-CM | POA: Diagnosis not present

## 2022-04-15 MED ORDER — ESOMEPRAZOLE MAGNESIUM 20 MG PO CPDR: 20.0000 mg | DELAYED_RELEASE_CAPSULE | Freq: Every day | ORAL | 1 refills | Status: DC

## 2022-04-15 MED ORDER — BUTALBITAL-APAP-CAFFEINE 50-325-40 MG PO TABS: 1.0000 | ORAL_TABLET | Freq: Four times a day (QID) | ORAL | 0 refills | Status: DC | PRN

## 2022-04-15 NOTE — Patient Instructions (Signed)
Lyrica and gabapentin  ?

## 2022-04-16 LAB — CBC WITH DIFFERENTIAL/PLATELET
Absolute Monocytes: 299 cells/uL (ref 200–950)
Basophils Absolute: 13 cells/uL (ref 0–200)
Basophils Relative: 0.2 %
Eosinophils Absolute: 72 cells/uL (ref 15–500)
Eosinophils Relative: 1.1 %
HCT: 40.9 % (ref 35.0–45.0)
Hemoglobin: 13.3 g/dL (ref 11.7–15.5)
Lymphs Abs: 2444 cells/uL (ref 850–3900)
MCH: 29.1 pg (ref 27.0–33.0)
MCHC: 32.5 g/dL (ref 32.0–36.0)
MCV: 89.5 fL (ref 80.0–100.0)
MPV: 9.5 fL (ref 7.5–12.5)
Monocytes Relative: 4.6 %
Neutro Abs: 3673 cells/uL (ref 1500–7800)
Neutrophils Relative %: 56.5 %
Platelets: 301 10*3/uL (ref 140–400)
RBC: 4.57 10*6/uL (ref 3.80–5.10)
RDW: 12.5 % (ref 11.0–15.0)
Total Lymphocyte: 37.6 %
WBC: 6.5 10*3/uL (ref 3.8–10.8)

## 2022-04-16 LAB — B12 AND FOLATE PANEL
Folate: 20.9 ng/mL
Vitamin B-12: 825 pg/mL (ref 200–1100)

## 2022-05-21 ENCOUNTER — Ambulatory Visit (INDEPENDENT_AMBULATORY_CARE_PROVIDER_SITE_OTHER): Payer: Medicare PPO | Admitting: Nurse Practitioner

## 2022-05-21 ENCOUNTER — Ambulatory Visit
Admission: RE | Admit: 2022-05-21 | Discharge: 2022-05-21 | Disposition: A | Discharge status: Home / Self Care | Payer: Medicare PPO | Source: Ambulatory Visit | Attending: Nurse Practitioner | Admitting: Nurse Practitioner

## 2022-05-21 ENCOUNTER — Ambulatory Visit
Admission: RE | Admit: 2022-05-21 | Discharge: 2022-05-21 | Disposition: A | Discharge status: Home / Self Care | Payer: Medicare PPO | Attending: Nurse Practitioner | Admitting: Nurse Practitioner

## 2022-05-21 ENCOUNTER — Encounter: Payer: Self-pay | Admitting: Nurse Practitioner

## 2022-05-21 VITALS — BP 136/80 | HR 90 | Temp 97.7°F | Resp 16 | Ht <= 58 in | Wt 153.3 lb

## 2022-05-21 DIAGNOSIS — M4856XA Collapsed vertebra, not elsewhere classified, lumbar region, initial encounter for fracture: Secondary | ICD-10-CM | POA: Diagnosis not present

## 2022-05-21 DIAGNOSIS — M8588 Other specified disorders of bone density and structure, other site: Secondary | ICD-10-CM | POA: Diagnosis not present

## 2022-05-21 DIAGNOSIS — M545 Low back pain, unspecified: Secondary | ICD-10-CM | POA: Insufficient documentation

## 2022-05-21 DIAGNOSIS — W19XXXA Unspecified fall, initial encounter: Secondary | ICD-10-CM

## 2022-05-21 MED ORDER — NAPROXEN 500 MG PO TABS: 500.0000 mg | ORAL_TABLET | Freq: Two times a day (BID) | ORAL | 0 refills | Status: DC

## 2022-05-21 NOTE — Progress Notes (Signed)
BP 136/80   Pulse 90   Temp 97.7 F (36.5 C)   Resp 16   Ht '4\' 9"'$  (1.448 m)   Wt 153 lb 4.8 oz (69.5 kg)   SpO2 93%   BMI 33.17 kg/m    Subjective:    Patient ID: Christina Salas, female    DOB: 1933/03/04, 88 y.o.   MRN: [id number]  HPI: Christina Salas is a 88 y.o. female  Chief Complaint  Patient presents with   Fall    On 5/14 w/ low back pain   Fall/lower back pain:   She says she has had a sharp pain in her back since falling on 05/10/2022. Pt reports that she was reaching for something over the table, lost her footing and fell flat on her back. She says she did not lose consciousness.  She does have a contusion on her left forearm.  She says that is from the fall it is healing.  She denies any other injuries.  She denies hitting her head.  He denies any dizziness, chest pain or shortness of breath.  She says that she has been taking tylenol for the pain and that helps.  She denies any numbness or tingling going down her legs.  She denies any incontinence.  Discussed using heat therapy.  Ordered x-ray.  Discussed continuing to take Tylenol can also take naproxen.   Patient lives alone.  Discussed ways to prevent falls.  Keeping walkways clear, have available lighting, keep items within reach.  She reports she is planning on getting a call system to call for help if she falls again.  Relevant past medical, surgical, family and social history reviewed and updated as indicated. Interim medical history since our last visit reviewed. Allergies and medications reviewed and updated.  Review of Systems  Constitutional: Negative for fever or weight change.  Respiratory: Negative for cough and shortness of breath.   Cardiovascular: Negative for chest pain or palpitations.  Gastrointestinal: Negative for abdominal pain, no bowel changes.  Musculoskeletal: positive for gait problem uses cane, negative joint swelling.  Skin: Negative for rash.  Neurological: Negative for  dizziness or headache.  No other specific complaints in a complete review of systems (except as listed in HPI above).      Objective:    BP 136/80   Pulse 90   Temp 97.7 F (36.5 C)   Resp 16   Ht '4\' 9"'$  (1.448 m)   Wt 153 lb 4.8 oz (69.5 kg)   SpO2 93%   BMI 33.17 kg/m   Wt Readings from Last 3 Encounters:  05/21/22 153 lb 4.8 oz (69.5 kg)  04/15/22 146 lb (66.2 kg)  02/20/22 148 lb 8 oz (67.4 kg)    Physical Exam  Constitutional: Patient appears well-developed and well-nourished. Obese  No distress.  HEENT: head atraumatic, normocephalic, pupils equal and reactive to light,  neck supple Cardiovascular: Normal rate, regular rhythm and normal heart sounds.  No murmur heard. No BLE edema. Pulmonary/Chest: Effort normal and breath sounds normal. No respiratory distress. Abdominal: Soft.  There is no tenderness MSK: Tenderness noted to lumbar midline Skin: Contusion to left forearm. Psychiatric: Patient has a normal mood and affect. behavior is normal. Judgment and thought content normal.  Results for orders placed or performed in visit on 04/15/22  CBC with Differential/Platelet  Result Value Ref Range   WBC 6.5 3.8 - 10.8 Thousand/uL   RBC 4.57 3.80 - 5.10 Million/uL   Hemoglobin 13.3 11.7 -  15.5 g/dL   HCT 40.9 35.0 - 45.0 %   MCV 89.5 80.0 - 100.0 fL   MCH 29.1 27.0 - 33.0 pg   MCHC 32.5 32.0 - 36.0 g/dL   RDW 12.5 11.0 - 15.0 %   Platelets 301 140 - 400 Thousand/uL   MPV 9.5 7.5 - 12.5 fL   Neutro Abs 3,673 1,500 - 7,800 cells/uL   Lymphs Abs 2,444 850 - 3,900 cells/uL   Absolute Monocytes 299 200 - 950 cells/uL   Eosinophils Absolute 72 15 - 500 cells/uL   Basophils Absolute 13 0 - 200 cells/uL   Neutrophils Relative % 56.5 %   Total Lymphocyte 37.6 %   Monocytes Relative 4.6 %   Eosinophils Relative 1.1 %   Basophils Relative 0.2 %  B12 and Folate Panel  Result Value Ref Range   Vitamin B-12 825 200 - 1,100 pg/mL   Folate 20.9 ng/mL      Assessment &  Plan:   Problem List Items Addressed This Visit   None Visit Diagnoses     Lumbar back pain    -  Primary   Continue taking Tylenol for pain, sent in naproxen also for pain.  Ordered lumbar x-ray.  May need referral to orthopedics.  Can use heat therapy.   Relevant Medications   naproxen (NAPROSYN) 500 MG tablet   Other Relevant Orders   DG Lumbar Spine Complete   Fall, initial encounter       Discussed ways to decrease chances of falling.  Keeping areas well lit, avoiding rugs, having things within reach keeping walkways clear.   Relevant Medications   naproxen (NAPROSYN) 500 MG tablet   Other Relevant Orders   DG Lumbar Spine Complete        Follow up plan: Return if symptoms worsen or fail to improve.

## 2022-05-22 ENCOUNTER — Telehealth: Payer: Self-pay

## 2022-05-22 ENCOUNTER — Ambulatory Visit: Payer: Medicare PPO | Admitting: Nurse Practitioner

## 2022-05-22 NOTE — Telephone Encounter (Signed)
Patient notified that we have not revived results back will call her soon as we do and she can check Mychart

## 2022-05-22 NOTE — Telephone Encounter (Unsigned)
Copied from [place] [phone]. Topic: General - Other >> May 22, 2022  2:20 PM Christina Salas A wrote: Reason for CRM: The patient would like to be contacted about their recent imaging results when possible  Please contact further

## 2022-05-22 NOTE — Telephone Encounter (Unsigned)
Copied from [place] [phone]. Topic: General - Other >> May 22, 2022  2:20 PM Tessa Lerner A wrote: Reason for CRM: The patient would like to be contacted about their recent imaging results when possible  Please contact further

## 2022-05-26 NOTE — Progress Notes (Signed)
Patient notified, will give it 2 weeks then call back if need referral

## 2022-06-11 ENCOUNTER — Ambulatory Visit: Payer: Medicare PPO | Admitting: Dermatology

## 2022-06-11 DIAGNOSIS — D229 Melanocytic nevi, unspecified: Secondary | ICD-10-CM

## 2022-06-11 DIAGNOSIS — Z85828 Personal history of other malignant neoplasm of skin: Secondary | ICD-10-CM

## 2022-06-11 DIAGNOSIS — L820 Inflamed seborrheic keratosis: Secondary | ICD-10-CM | POA: Diagnosis not present

## 2022-06-11 DIAGNOSIS — I872 Venous insufficiency (chronic) (peripheral): Secondary | ICD-10-CM | POA: Diagnosis not present

## 2022-06-11 DIAGNOSIS — Z86018 Personal history of other benign neoplasm: Secondary | ICD-10-CM

## 2022-06-11 DIAGNOSIS — Z1283 Encounter for screening for malignant neoplasm of skin: Secondary | ICD-10-CM

## 2022-06-11 DIAGNOSIS — L578 Other skin changes due to chronic exposure to nonionizing radiation: Secondary | ICD-10-CM

## 2022-06-11 DIAGNOSIS — D1800 Hemangioma unspecified site: Secondary | ICD-10-CM

## 2022-06-11 DIAGNOSIS — L821 Other seborrheic keratosis: Secondary | ICD-10-CM

## 2022-06-11 DIAGNOSIS — Z85820 Personal history of malignant melanoma of skin: Secondary | ICD-10-CM | POA: Diagnosis not present

## 2022-06-11 DIAGNOSIS — L814 Other melanin hyperpigmentation: Secondary | ICD-10-CM | POA: Diagnosis not present

## 2022-06-11 DIAGNOSIS — Z853 Personal history of malignant neoplasm of breast: Secondary | ICD-10-CM

## 2022-06-11 MED ORDER — TRIAMCINOLONE ACETONIDE 0.1 % EX CREA: TOPICAL_CREAM | CUTANEOUS | 1 refills | Status: DC

## 2022-06-11 NOTE — Patient Instructions (Addendum)
Recommend wearing compression stockings in the morning daily and removing them at bedtime. Also apply Triamcinolone cream to both legs Monday thru Friday for rash .  Avoid applying to face, groin, and axilla. Use as directed. Long-term use can cause thinning of the skin.  Topical steroids (such as triamcinolone, fluocinolone, fluocinonide, mometasone, clobetasol, halobetasol, betamethasone, hydrocortisone) can cause thinning and lightening of the skin if they are used for too long in the same area. Your physician has selected the right strength medicine for your problem and area affected on the body. Please use your medication only as directed by your physician to prevent side effects.      Melanoma ABCDEs  Melanoma is the most dangerous type of skin cancer, and is the leading cause of death from skin disease.  You are more likely to develop melanoma if you: Have light-colored skin, light-colored eyes, or red or blond hair Spend a lot of time in the sun Tan regularly, either outdoors or in a tanning bed Have had blistering sunburns, especially during childhood Have a close family member who has had a melanoma Have atypical moles or large birthmarks  Early detection of melanoma is key since treatment is typically straightforward and cure rates are extremely high if we catch it early.   The first sign of melanoma is often a change in a mole or a new dark spot.  The ABCDE system is a way of remembering the signs of melanoma.  A for asymmetry:  The two halves do not match. B for border:  The edges of the growth are irregular. C for color:  A mixture of colors are present instead of an even brown color. D for diameter:  Melanomas are usually (but not always) greater than 53m - the size of a pencil eraser. E for evolution:  The spot keeps changing in size, shape, and color.  Please check your skin once per month between visits. You can use a small mirror in front and a large mirror behind you  to keep an eye on the back side or your body.   If you see any new or changing lesions before your next follow-up, please call to schedule a visit.  Please continue daily skin protection including broad spectrum sunscreen SPF 30+ to sun-exposed areas, reapplying every 2 hours as needed when you're outdoors.   Staying in the shade or wearing long sleeves, sun glasses (UVA+UVB protection) and wide brim hats (4-inch brim around the entire circumference of the hat) are also recommended for sun protection.    Due to recent changes in healthcare laws, you may see results of your pathology and/or laboratory studies on MyChart before the doctors have had a chance to review them. We understand that in some cases there may be results that are confusing or concerning to you. Please understand that not all results are received at the same time and often the doctors may need to interpret multiple results in order to provide you with the best plan of care or course of treatment. Therefore, we ask that you please give u[place]2 business days to thoroughly review all your results before contacting the office for clarification. Should we see a critical lab result, you will be contacted sooner.   If You Need Anything After Your Visit  If you have any questions or concerns for your doctor, please call our main line at [phone] press option 4 to reach your doctor's medical assistant. If no one answers, please leave a voicemail  as directed and we will return your call as soon as possible. Messages left after 4 pm will be answered the following business day.   You may also send [place] a message via [place]. We typically respond to MyChart messages within 1-2 business days.  For prescription refills, please ask your pharmacy to contact our office. Our fax number is [phone].  If you have an urgent issue when the clinic is closed that cannot wait until the next business day, you can page your doctor at the number  below.    Please note that while we do our best to be available for urgent issues outside of office hours, we are not available 24/7.   If you have an urgent issue and are unable to reach [place], you may choose to seek medical care at your doctor's office, retail clinic, urgent care center, or emergency room.  If you have a medical emergency, please immediately call 911 or go to the emergency department.  Pager Numbers  - Dr. Nehemiah Massed: [phone]  - Dr. Laurence Ferrari: [phone]  - Dr. Nicole Kindred: [phone]  In the event of inclement weather, please call our main line at [phone] for an update on the status of any delays or closures.  Dermatology Medication Tips: Please keep the boxes that topical medications come in in order to help keep track of the instructions about where and how to use these. Pharmacies typically print the medication instructions only on the boxes and not directly on the medication tubes.   If your medication is too expensive, please contact our office at [phone] option 4 or send [place] a message through [place].   We are unable to tell what your co-pay for medications will be in advance as this is different depending on your insurance coverage. However, we may be able to find a substitute medication at lower cost or fill out paperwork to get insurance to cover a needed medication.   If a prior authorization is required to get your medication covered by your insurance company, please allow [place] 1-2 business days to complete this process.  Drug prices often vary depending on where the prescription is filled and some pharmacies may offer cheaper prices.  The website www.goodrx.com contains coupons for medications through different pharmacies. The prices here do not account for what the cost may be with help from insurance (it may be cheaper with your insurance), but the website can give you the price if you did not use any insurance.  - You can print the associated coupon  and take it with your prescription to the pharmacy.  - You may also stop by our office during regular business hours and pick up a GoodRx coupon card.  - If you need your prescription sent electronically to a different pharmacy, notify our office through [facility] or by phone at [phone] option 4.     Si Usted Necesita Algo Despus de Su Visita  Tambin puede enviarnos un mensaje a travs de Pharmacist, community. Por lo general respondemos a los mensajes de MyChart en el transcurso de 1 a 2 das hbiles.  Para renovar recetas, por favor pida a su farmacia que se ponga en contacto con nuestra oficina. Harland Dingwall de fax es [place] [phone].  Si tiene un asunto urgente cuando la clnica est cerrada y que no puede esperar hasta el siguiente da hbil, puede llamar/localizar a su doctor(a) al nmero que aparece a continuacin.   Por favor, tenga en cuenta que aunque hacemos todo lo posible  para estar disponibles para asuntos urgentes fuera del horario de [place], no estamos disponibles las 24 horas del da, los 7 das de la [place].   Si tiene un problema urgente y no puede comunicarse con nosotros, puede optar por buscar atencin mdica  en el consultorio de su doctor(a), en una clnica privada, en un centro de atencin urgente o en una sala de emergencias.  Si tiene Engineering geologist, por favor llame inmediatamente al 911 o vaya a la sala de emergencias.  Nmeros de bper  - Dr. Nehemiah Massed: [phone]  - Dra. Moye: [phone]  - Dra. Nicole Kindred: [phone]  En caso de inclemencias del [place], por favor llame a Johnsie Kindred principal al [phone] para una actualizacin sobre el [place] de cualquier retraso o cierre.  Consejos para la medicacin en dermatologa: Por favor, guarde las cajas en las que vienen los medicamentos de uso tpico para ayudarle a seguir las instrucciones sobre dnde y cmo usarlos. Las farmacias generalmente imprimen las instrucciones del medicamento  slo en las cajas y no directamente en los tubos del [place].   Si su medicamento es muy caro, por favor, pngase en contacto con Zigmund Daniel llamando al [phone] y presione la opcin 4 o envenos un mensaje a travs de Pharmacist, community.   No podemos decirle cul ser su copago por los medicamentos por adelantado ya que esto es diferente dependiendo de la cobertura de su seguro. Sin embargo, es posible que podamos encontrar un medicamento sustituto a Electrical engineer un formulario para que el seguro cubra el medicamento que se considera necesario.   Si se requiere una autorizacin previa para que su compaa de seguros [place] su medicamento, por favor permtanos de 1 a 2 das hbiles para completar este proceso.  Los precios de los medicamentos varan con frecuencia dependiendo del Environmental consultant de dnde se surte la receta y alguna farmacias pueden ofrecer precios ms baratos.  El sitio web www.goodrx.com tiene cupones para medicamentos de Airline pilot. Los precios aqu no tienen en cuenta lo que podra costar con la ayuda del seguro (puede ser ms barato con su seguro), pero el sitio web puede darle el precio si no utiliz Research scientist (physical sciences).  - Puede imprimir el cupn correspondiente y llevarlo con su receta a la farmacia.  - Tambin puede pasar por nuestra oficina durante el horario de atencin regular y Charity fundraiser una tarjeta de cupones de GoodRx.  - Si necesita que su receta se enve electrnicamente a una farmacia diferente, informe a nuestra oficina a travs de MyChart de  o por telfono llamando al [phone] y presione la opcin 4.

## 2022-06-11 NOTE — Progress Notes (Unsigned)
Follow-Up Visit   Subjective  Christina Salas is a 88 y.o. female who presents for the following: Annual Exam (1 year tbse, reports a bump at scalp she noticed. Hx of dysplastic nevus , hx of melanoma , hx of breast cancer. ). The patient presents for Total-Body Skin Exam (TBSE) for skin cancer screening and mole check.  The patient has spots, moles and lesions to be evaluated, some may be new or changing and the patient has concerns that these could be cancer. The following portions of the chart were reviewed this encounter and updated as appropriate:  Tobacco  Allergies  Meds  Problems  Med Hx  Surg Hx  Fam Hx     Review of Systems: No other skin or systemic complaints except as noted in HPI or Assessment and Plan.  Objective  Well appearing patient in no apparent distress; mood and affect are within normal limits.  A full examination was performed including scalp, head, eyes, ears, nose, lips, neck, chest, axillae, abdomen, back, buttocks, bilateral upper extremities, bilateral lower extremities, hands, feet, fingers, toes, fingernails, and toenails. All findings within normal limits unless otherwise noted below.  right posterior scalp x 1 Erythematous stuck-on, waxy papule or plaque  b/l lower legs          Assessment & Plan  Inflamed seborrheic keratosis right posterior scalp x 1 Symptomatic, irritating, patient would like treated.  Destruction of lesion - right posterior scalp x 1 Complexity: simple   Destruction method: cryotherapy   Informed consent: discussed and consent obtained   Timeout:  patient name, date of birth, surgical site, and procedure verified Lesion destroyed using liquid nitrogen: Yes   Region frozen until ice ball extended beyond lesion: Yes   Outcome: patient tolerated procedure well with no complications   Post-procedure details: wound care instructions given   Additional details:  Prior to procedure, discussed risks of blister formation,  small wound, skin dyspigmentation, or rare scar following cryotherapy. Recommend Vaseline ointment to treated areas while healing.  Stasis dermatitis of both legs b/l lower legs Stasis in the legs causes chronic leg swelling, which may result in itchy or painful rashes, skin discoloration, skin texture changes, and sometimes ulceration.  Recommend daily graduated compression hose/stockings- easiest to put on first thing in morning, remove at bedtime.  Elevate legs as much as possible. Avoid salt/sodium rich foods.  Recommend tmc cream use  Recommend Compression stockings put on in morning and then remove at bedtime   triamcinolone cream (KENALOG) 0.1 % - b/l lower legs Apply topically to lower legs 5 days a week for rash. Avoid applying to face, groin, and axilla. Use as directed.  Lentigines - Scattered tan macules - Due to sun exposure - Benign-appearing, observe - Recommend daily broad spectrum sunscreen SPF 30+ to sun-exposed areas, reapply every 2 hours as needed. - Call for any changes  Seborrheic Keratoses - Stuck-on, waxy, tan-brown papules and/or plaques  - Benign-appearing - Discussed benign etiology and prognosis. - Observe - Call for any changes  Melanocytic Nevi - Tan-brown and/or pink-flesh-colored symmetric macules and papules - Benign appearing on exam today - Observation - Call clinic for new or changing moles - Recommend daily use of broad spectrum spf 30+ sunscreen to sun-exposed areas.   Hemangiomas - Red papules - Discussed benign nature - Observe - Call for any changes  Actinic Damage - Chronic condition, secondary to cumulative UV/sun exposure - diffuse scaly erythematous macules with underlying dyspigmentation - Recommend daily broad  spectrum sunscreen SPF 30+ to sun-exposed areas, reapply every 2 hours as needed.  - Staying in the shade or wearing long sleeves, sun glasses (UVA+UVB protection) and wide brim hats (4-inch brim around the entire  circumference of the hat) are also recommended for sun protection.  - Call for new or changing lesions.  History of Melanoma - No evidence of recurrence today left crown scalp - No lymphadenopathy - Recommend regular full body skin exams - Recommend daily broad spectrum sunscreen SPF 30+ to sun-exposed areas, reapply every 2 hours as needed.  - Call if any new or changing lesions are noted between office visits  History of breast cancer Left Breast 1996 Clear today No lymphadenopathy  History of Dysplastic Nevi - No evidence of recurrence today multiple sites see history  - Recommend regular full body skin exams - Recommend daily broad spectrum sunscreen SPF 30+ to sun-exposed areas, reapply every 2 hours as needed.  - Call if any new or changing lesions are noted between office visits  Skin cancer screening performed today. Return for 2 - 3 month stasis derm follow up, 1 year tbse . IRuthell Rummage, CMA, am acting as scribe for Sarina Ser, MD. Documentation: I have reviewed the above documentation for accuracy and completeness, and I agree with the above.  Sarina Ser, MD

## 2022-06-15 DIAGNOSIS — M79674 Pain in right toe(s): Secondary | ICD-10-CM | POA: Diagnosis not present

## 2022-06-15 DIAGNOSIS — L851 Acquired keratosis [keratoderma] palmaris et plantaris: Secondary | ICD-10-CM | POA: Diagnosis not present

## 2022-06-15 DIAGNOSIS — B351 Tinea unguium: Secondary | ICD-10-CM | POA: Diagnosis not present

## 2022-06-15 DIAGNOSIS — M79675 Pain in left toe(s): Secondary | ICD-10-CM | POA: Diagnosis not present

## 2022-06-16 ENCOUNTER — Encounter: Payer: Self-pay | Admitting: Dermatology

## 2022-08-06 ENCOUNTER — Other Ambulatory Visit: Payer: Self-pay | Admitting: Family Medicine

## 2022-08-06 DIAGNOSIS — Z1231 Encounter for screening mammogram for malignant neoplasm of breast: Secondary | ICD-10-CM

## 2022-08-10 DIAGNOSIS — N3946 Mixed incontinence: Secondary | ICD-10-CM | POA: Diagnosis not present

## 2022-08-10 DIAGNOSIS — N3000 Acute cystitis without hematuria: Secondary | ICD-10-CM | POA: Diagnosis not present

## 2022-08-12 ENCOUNTER — Encounter: Payer: Self-pay | Admitting: Family Medicine

## 2022-08-20 ENCOUNTER — Ambulatory Visit: Payer: Medicare PPO | Admitting: Dermatology

## 2022-08-20 DIAGNOSIS — I872 Venous insufficiency (chronic) (peripheral): Secondary | ICD-10-CM | POA: Diagnosis not present

## 2022-08-20 MED ORDER — TRIAMCINOLONE ACETONIDE 0.1 % EX CREA: TOPICAL_CREAM | CUTANEOUS | 3 refills | Status: DC

## 2022-08-20 NOTE — Progress Notes (Signed)
   Follow-Up Visit   Subjective  Christina Salas is a 88 y.o. female who presents for the following: Stasis dermatitis (Bil lower legs, TMC 0.1% cr 5n/wk, compression socks). She feels like she is improved.  The following portions of the chart were reviewed this encounter and updated as appropriate:   Tobacco  Allergies  Meds  Problems  Med Hx  Surg Hx  Fam Hx     Review of Systems:  No other skin or systemic complaints except as noted in HPI or Assessment and Plan.  Objective  Well appearing patient in no apparent distress; mood and affect are within normal limits.  A focused examination was performed including lower legs. Relevant physical exam findings are noted in the Assessment and Plan.  bil lower legs Erythematous, scaly patches involving the ankle and distal lower leg with associated lower leg edema.           Assessment & Plan  Stasis dermatitis of both legs bil lower legs Chronic and persistent condition with duration or expected duration over one year. Condition is symptomatic / bothersome to patient. Not to goal, but improving  Stasis in the legs causes chronic leg swelling, which may result in itchy or painful rashes, skin discoloration, skin texture changes, and sometimes ulceration.  Recommend daily graduated compression hose/stockings- easiest to put on first thing in morning, remove at bedtime.  Elevate legs as much as possible. Avoid salt/sodium rich foods.   Discussed stronger compression or zippered/velcro compression but patient states that she will not use them and she would like to continue her current compression since she can get them on okay and they are helping.  Cont TMC 0.1% cr may use 3 to 5 days a week at night to lower legs, avoid f/g/a Cont Compression socks  Related Medications triamcinolone cream (KENALOG) 0.1 % Apply topically to lower legs 3-5 days a week for rash. Avoid applying to face, groin, and axilla. Use as directed.  Return  in about 10 months (around 06/21/2023) for TBSE.  I, Othelia Pulling, RMA, am acting as scribe for Sarina Ser, MD .  Documentation: I have reviewed the above documentation for accuracy and completeness, and I agree with the above.  Sarina Ser, MD

## 2022-08-20 NOTE — Patient Instructions (Addendum)
Continue Triamcinolone 0.1% cream may use 3 to 5 days a week at night to lower legs, avoid face, groin, axilla Continue Compression socks    Due to recent changes in healthcare laws, you may see results of your pathology and/or laboratory studies on MyChart before the doctors have had a chance to review them. We understand that in some cases there may be results that are confusing or concerning to you. Please understand that not all results are received at the same time and often the doctors may need to interpret multiple results in order to provide you with the best plan of care or course of treatment. Therefore, we ask that you please give [place] 2 business days to thoroughly review all your results before contacting the office for clarification. Should we see a critical lab result, you will be contacted sooner.   If You Need Anything After Your Visit  If you have any questions or concerns for your doctor, please call our main line at [phone] and press option 4 to reach your doctor's medical assistant. If no one answers, please leave a voicemail as directed and we will return your call as soon as possible. Messages left after 4 pm will be answered the following business day.   You may also send [place] a message via [place]. We typically respond to MyChart messages within 1-2 business days.  For prescription refills, please ask your pharmacy to contact our office. Our fax number is [phone].  If you have an urgent issue when the clinic is closed that cannot wait until the next business day, you can page your doctor at the number below.    Please note that while we do our best to be available for urgent issues outside of office hours, we are not available 24/7.   If you have an urgent issue and are unable to reach [place], you may choose to seek medical care at your doctor's office, retail clinic, urgent care center, or emergency room.  If you have a medical emergency, please immediately call 911  or go to the emergency department.  Pager Numbers  - Dr. Nehemiah Massed: [phone]  - Dr. Laurence Ferrari: [phone]  - Dr. Nicole Kindred: [phone]  In the event of inclement weather, please call our main line at [phone] for an update on the status of any delays or closures.  Dermatology Medication Tips: Please keep the boxes that topical medications come in in order to help keep track of the instructions about where and how to use these. Pharmacies typically print the medication instructions only on the boxes and not directly on the medication tubes.   If your medication is too expensive, please contact our office at [phone] option 4 or send [place] a message through [place].   We are unable to tell what your co-pay for medications will be in advance as this is different depending on your insurance coverage. However, we may be able to find a substitute medication at lower cost or fill out paperwork to get insurance to cover a needed medication.   If a prior authorization is required to get your medication covered by your insurance company, please allow [place] 1-2 business days to complete this process.  Drug prices often vary depending on where the prescription is filled and some pharmacies may offer cheaper prices.  The website www.goodrx.com contains coupons for medications through different pharmacies. The prices here do not account for what the cost may be with help from insurance (it may be cheaper with your insurance),  but the website can give you the price if you did not use any insurance.  - You can print the associated coupon and take it with your prescription to the pharmacy.  - You may also stop by our office during regular business hours and pick up a GoodRx coupon card.  - If you need your prescription sent electronically to a different pharmacy, notify our office through [facility] or by phone at [phone] option 4.     Si Usted Necesita Algo Despus de Su  Visita  Tambin puede enviarnos un mensaje a travs de Pharmacist, community. Por lo general respondemos a los mensajes de MyChart en el transcurso de 1 a 2 das hbiles.  Para renovar recetas, por favor pida a su farmacia que se ponga en contacto con nuestra oficina. Harland Dingwall de fax es [place] [phone].  Si tiene un asunto urgente cuando la clnica est cerrada y que no puede esperar hasta el siguiente da hbil, puede llamar/localizar a su doctor(a) al nmero que aparece a continuacin.   Por favor, tenga en cuenta que aunque hacemos todo lo posible para estar disponibles para asuntos urgentes fuera del horario de [place], no estamos disponibles las 24 horas del da, los 7 das de la [place].   Si tiene un problema urgente y no puede comunicarse con nosotros, puede optar por buscar atencin mdica  en el consultorio de su doctor(a), en una clnica privada, en un centro de atencin urgente o en una sala de emergencias.  Si tiene Engineering geologist, por favor llame inmediatamente al 911 o vaya a la sala de emergencias.  Nmeros de bper  - Dr. Nehemiah Massed: [phone]  - Dra. Moye: [phone]  - Dra. Nicole Kindred: [phone]  En caso de inclemencias del [place], por favor llame a Johnsie Kindred principal al [phone] para una actualizacin sobre el [place] de cualquier retraso o cierre.  Consejos para la medicacin en dermatologa: Por favor, guarde las cajas en las que vienen los medicamentos de uso tpico para ayudarle a seguir las instrucciones sobre dnde y cmo usarlos. Las farmacias generalmente imprimen las instrucciones del medicamento slo en las cajas y no directamente en los tubos del [place].   Si su medicamento es muy caro, por favor, pngase en contacto con Zigmund Daniel llamando al [phone] y presione la opcin 4 o envenos un mensaje a travs de Pharmacist, community.   No podemos decirle cul ser su copago por los medicamentos por adelantado ya que esto es diferente dependiendo de la  cobertura de su seguro. Sin embargo, es posible que podamos encontrar un medicamento sustituto a Electrical engineer un formulario para que el seguro cubra el medicamento que se considera necesario.   Si se requiere una autorizacin previa para que su compaa de seguros [place] su medicamento, por favor permtanos de 1 a 2 das hbiles para completar este proceso.  Los precios de los medicamentos varan con frecuencia dependiendo del Environmental consultant de dnde se surte la receta y alguna farmacias pueden ofrecer precios ms baratos.  El sitio web www.goodrx.com tiene cupones para medicamentos de Airline pilot. Los precios aqu no tienen en cuenta lo que podra costar con la ayuda del seguro (puede ser ms barato con su seguro), pero el sitio web puede darle el precio si no utiliz Research scientist (physical sciences).  - Puede imprimir el cupn correspondiente y llevarlo con su receta a la farmacia.  - Tambin puede pasar por nuestra oficina durante el horario de atencin regular y Charity fundraiser una tarjeta de cupones  de GoodRx.  - Si necesita que su receta se enve electrnicamente a una farmacia diferente, informe a nuestra oficina a travs de MyChart de [place] o por telfono llamando al [phone] y presione la opcin 4.

## 2022-08-22 ENCOUNTER — Encounter: Payer: Self-pay | Admitting: Dermatology

## 2022-09-04 ENCOUNTER — Telehealth: Payer: Self-pay

## 2022-09-04 NOTE — Telephone Encounter (Signed)
Copied from [place] [id number]. Topic: General - Other >> Sep 03, 2022  9:56 AM Leitha Schuller wrote: Pt requesting a call back from [employer] states she has general questions  Please fu w/ pt >> Sep 04, 2022  2:49 PM Ian Bushman wrote: Bonne Dolores to call pt back. >> Sep 04, 2022  2:32 PM Carroll Kinds wrote: >> Sep 03, 2022 10:16 AM [place] R wrote: CRM routed to incorrect office.     09/04/22 Called and spoke to patient already

## 2022-09-04 NOTE — Telephone Encounter (Signed)
Copied from [place] [id number]. Topic: General - Other >> Sep 04, 2022  3:05 PM LOPRAFOA J wrote: Reason for CRM: pt called in to be advised. Pt would like to know/discuss with Lenna Sciara what series of covid vaccine should she be having?

## 2022-09-04 NOTE — Telephone Encounter (Signed)
Called patient and let her know to get the new booster at the end of October. She did let me know she had 4 shots so I entered the 4th. She will reach back out once she gets the next booster for [place] to put in her chart.

## 2022-09-10 ENCOUNTER — Ambulatory Visit
Admission: RE | Admit: 2022-09-10 | Discharge: 2022-09-10 | Disposition: A | Discharge status: Home / Self Care | Payer: Medicare PPO | Source: Ambulatory Visit | Attending: Family Medicine | Admitting: Family Medicine

## 2022-09-10 DIAGNOSIS — M810 Age-related osteoporosis without current pathological fracture: Secondary | ICD-10-CM | POA: Diagnosis not present

## 2022-09-10 DIAGNOSIS — Z1231 Encounter for screening mammogram for malignant neoplasm of breast: Secondary | ICD-10-CM | POA: Diagnosis not present

## 2022-09-10 DIAGNOSIS — M85832 Other specified disorders of bone density and structure, left forearm: Secondary | ICD-10-CM | POA: Diagnosis not present

## 2022-09-10 DIAGNOSIS — Z780 Asymptomatic menopausal state: Secondary | ICD-10-CM | POA: Diagnosis not present

## 2022-09-22 ENCOUNTER — Other Ambulatory Visit: Payer: Self-pay | Admitting: Nurse Practitioner

## 2022-09-22 ENCOUNTER — Other Ambulatory Visit: Payer: Self-pay | Admitting: Family Medicine

## 2022-09-22 DIAGNOSIS — G44209 Tension-type headache, unspecified, not intractable: Secondary | ICD-10-CM

## 2022-09-22 DIAGNOSIS — W19XXXA Unspecified fall, initial encounter: Secondary | ICD-10-CM

## 2022-09-22 DIAGNOSIS — K219 Gastro-esophageal reflux disease without esophagitis: Secondary | ICD-10-CM

## 2022-09-22 DIAGNOSIS — M5412 Radiculopathy, cervical region: Secondary | ICD-10-CM

## 2022-09-22 DIAGNOSIS — M545 Low back pain, unspecified: Secondary | ICD-10-CM

## 2022-09-22 MED ORDER — ESOMEPRAZOLE MAGNESIUM 20 MG PO CPDR: 20.0000 mg | DELAYED_RELEASE_CAPSULE | Freq: Every day | ORAL | 1 refills | Status: DC

## 2022-09-22 NOTE — Telephone Encounter (Signed)
Requested Prescriptions  Pending Prescriptions Disp Refills  . naproxen (NAPROSYN) 500 MG tablet [Pharmacy Med Name: NAPROXEN 500MG TABLETS] 20 tablet 0    Sig: TAKE 1 TABLET(500 MG) BY MOUTH TWICE DAILY WITH A MEAL     Analgesics:  NSAIDS Failed - 09/22/2022  4:37 PM      Failed - Manual Review: Labs are only required if the patient has taken medication for more than 8 weeks.      Passed - Cr in normal range and within 360 days    Creat  Date Value Ref Range Status  10/16/2021 0.70 0.60 - 0.95 mg/dL Final         Passed - HGB in normal range and within 360 days    Hemoglobin  Date Value Ref Range Status  04/15/2022 13.3 11.7 - 15.5 g/dL Final  12/02/2021 13.2 11.1 - 15.9 g/dL Final         Passed - PLT in normal range and within 360 days    Platelets  Date Value Ref Range Status  04/15/2022 301 140 - 400 Thousand/uL Final  12/02/2021 181 150 - 450 x10E3/uL Final         Passed - HCT in normal range and within 360 days    HCT  Date Value Ref Range Status  04/15/2022 40.9 35.0 - 45.0 % Final   Hematocrit  Date Value Ref Range Status  12/02/2021 39.7 34.0 - 46.6 % Final         Passed - eGFR is 30 or above and within 360 days    GFR, Est African American  Date Value Ref Range Status  10/22/2020 85 > OR = 60 mL/min/1.69m Final   GFR, Est Non African American  Date Value Ref Range Status  10/22/2020 73 > OR = 60 mL/min/1.751mFinal   eGFR  Date Value Ref Range Status  10/16/2021 85 > OR = 60 mL/min/1.7358minal    Comment:    The eGFR is based on the CKD-EPI 2021 equation. To calculate  the new eGFR from a previous Creatinine or Cystatin C result, go to https://www.kidney.org/professionals/ kdoqi/gfr%5Fcalculator          Passed - Patient is not pregnant      Passed - Valid encounter within last 12 months    Recent Outpatient Visits          4 months ago Lumbar back pain   CHMM[place]NP   5 months ago Angina at rest  ([facility] SizerD   7 months ago ArtPupukeaO   7 months ago Right leg pain   CHMCassO [place] months ago Angina at rest ([facility] MuskogeewSteele SizerD      Future Appointments            In 3 weeks SowSteele SizerD [facility] CentersECPewee ValleyIn 9 months KowRalene BatheD [place]        . butalbital-acetaminophen-caffeine (FIORICET) 50-325-40 MG tablet 60 tablet 0    Sig: Take 1 tablet by mouth every 6 (six) hours as needed.     Not Delegated - Analgesics:  Non-Opioid Analgesic Combinations 2 Failed - 09/22/2022  4:37 PM      Failed - This refill cannot be delegated  Passed - Cr in normal range and within 360 days    Creat  Date Value Ref Range Status  10/16/2021 0.70 0.60 - 0.95 mg/dL Final         Passed - eGFR is 10 or above and within 360 days    GFR, Est African American  Date Value Ref Range Status  10/22/2020 85 > OR = 60 mL/min/1.13m Final   GFR, Est Non African American  Date Value Ref Range Status  10/22/2020 73 > OR = 60 mL/min/1.750mFinal   eGFR  Date Value Ref Range Status  10/16/2021 85 > OR = 60 mL/min/1.7351minal    Comment:    The eGFR is based on the CKD-EPI 2021 equation. To calculate  the new eGFR from a previous Creatinine or Cystatin C result, go to https://www.kidney.org/professionals/ kdoqi/gfr%5Fcalculator          Passed - Patient is not pregnant      Passed - Valid encounter within last 12 months    Recent Outpatient Visits          4 months ago Lumbar back pain   CHMPle[place] ValleyNP   5 months ago Angina at rest ([facility] CHMH[facility] CenterwSteele SizerD   7 months ago ArtOneidaO   7 months ago Right leg pain   CHMM[place] months ago Angina at rest ([facility] CH[facility] LLCwSteele SizerD      Future Appointments            In 3 weeks SowSteele SizerD CHMKear[facility] Eye Surgical [facility] IncECTemperanceIn 9 months KowRalene BatheD [place]        . esomeprazole (NEXIUM) 20 MG capsule 90 capsule 1    Sig: Take 1 capsule (20 mg total) by mouth daily.     Gastroenterology: Proton Pump Inhibitors 2 Passed - 09/22/2022  4:37 PM      Passed - ALT in normal range and within 360 days    ALT  Date Value Ref Range Status  10/16/2021 8 6 - 29 U/L Final   SGPT (ALT)  Date Value Ref Range Status  02/23/2013 26 12 - 78 U/L Final         Passed - AST in normal range and within 360 days    AST  Date Value Ref Range Status  10/16/2021 14 10 - 35 U/L Final   SGOT(AST)  Date Value Ref Range Status  02/23/2013 34 15 - 37 Unit/L Final         Passed - Valid encounter within last 12 months    Recent Outpatient Visits          4 months ago Lumbar back pain   CHMOssianNP   5 months ago Angina at rest ([facility] CHMHen[facility]le SizerD   7 months ago ArtSunnysideO   7 months ago Right leg pain   CHMM[place]idokaO   11 months ago Angina at rest ([facility] CHMT[facility]oscopywSteele SizerD      Future Appointments            In 3 weeks SowAt[place]  Drue Stager, MD [facility], [place]   In 9 months Ralene Bathe, MD [place]

## 2022-09-22 NOTE — Telephone Encounter (Signed)
Requested medication (s) are due for refill today: yes  Requested medication (s) are on the active medication list: yes  Last refill:  04/15/22 #60 with 0 RF  Future visit scheduled: yes, in October, ws seen in May.  Notes to clinic:  This medication can not be delegated, please assess.        Requested Prescriptions  Pending Prescriptions Disp Refills   butalbital-acetaminophen-caffeine (FIORICET) 50-325-40 MG tablet 60 tablet 0    Sig: Take 1 tablet by mouth every 6 (six) hours as needed.     Not Delegated - Analgesics:  Non-Opioid Analgesic Combinations 2 Failed - 09/22/2022  4:37 PM      Failed - This refill cannot be delegated      Passed - Cr in normal range and within 360 days    Creat  Date Value Ref Range Status  10/16/2021 0.70 0.60 - 0.95 mg/dL Final         Passed - eGFR is 10 or above and within 360 days    GFR, Est African American  Date Value Ref Range Status  10/22/2020 85 > OR = 60 mL/min/1.60m Final   GFR, Est Non African American  Date Value Ref Range Status  10/22/2020 73 > OR = 60 mL/min/1.719mFinal   eGFR  Date Value Ref Range Status  10/16/2021 85 > OR = 60 mL/min/1.7346minal    Comment:    The eGFR is based on the CKD-EPI 2021 equation. To calculate  the new eGFR from a previous Creatinine or Cystatin C result, go to https://www.kidney.org/professionals/ kdoqi/gfr%5Fcalculator          Passed - Patient is not pregnant      Passed - Valid encounter within last 12 months    Recent Outpatient Visits           4 months ago Lumbar back pain   [facility]Seraf[facility] Royals FNP   5 months ago Angina at rest ([facility] CHM[facility]wSteele SizerD   7 months ago ArtCainsville Medical CenterdTeodora MediciO   7 months ago Right leg pain   CHMM[facility]sdTeodora MediciO [place] months ago Angina at rest ([facility] CHMPl[facility] HospitalwSteele SizerD       Future Appointments             In 3 weeks SowSteele SizerD CHM[facility] HospitalECDevonIn 9 months KowRalene BatheD [place]         Signed Prescriptions Disp Refills   naproxen (NAPROSYN) 500 MG tablet 20 tablet 0    Sig: TAKE 1 TABLET(500 MG) BY MOUTH TWICE DAILY WITH A MEAL     Analgesics:  NSAIDS Failed - 09/22/2022  4:37 PM      Failed - Manual Review: Labs are only required if the patient has taken medication for more than 8 weeks.      Passed - Cr in normal range and within 360 days    Creat  Date Value Ref Range Status  10/16/2021 0.70 0.60 - 0.95 mg/dL Final         Passed - HGB in normal range and within 360 days    Hemoglobin  Date Value Ref Range Status  04/15/2022 13.3 11.7 - 15.5 g/dL Final  12/02/2021 13.2 11.1 - 15.9 g/dL Final  Passed - PLT in normal range and within 360 days    Platelets  Date Value Ref Range Status  04/15/2022 301 140 - 400 Thousand/uL Final  12/02/2021 181 150 - 450 x10E3/uL Final         Passed - HCT in normal range and within 360 days    HCT  Date Value Ref Range Status  04/15/2022 40.9 35.0 - 45.0 % Final   Hematocrit  Date Value Ref Range Status  12/02/2021 39.7 34.0 - 46.6 % Final         Passed - eGFR is 30 or above and within 360 days    GFR, Est African American  Date Value Ref Range Status  10/22/2020 85 > OR = 60 mL/min/1.70m Final   GFR, Est Non African American  Date Value Ref Range Status  10/22/2020 73 > OR = 60 mL/min/1.750mFinal   eGFR  Date Value Ref Range Status  10/16/2021 85 > OR = 60 mL/min/1.7381minal    Comment:    The eGFR is based on the CKD-EPI 2021 equation. To calculate  the new eGFR from a previous Creatinine or Cystatin C result, go to https://www.kidney.org/professionals/ kdoqi/gfr%5Fcalculator          Passed - Patient is not pregnant      Passed - Valid encounter within last 12 months    Recent Outpatient Visits            4 months ago Lumbar back pain   CHMWhite Sulphur SpringsNP   5 months ago Angina at rest ([facility] CHM[facility]wSteele SizerD   7 months ago ArtRose Farm Medical CenterdTeodora MediciO   7 months ago Right leg pain   CHMR[facility]dTeodora MediciO   11 months ago Angina at rest ([facility] CHMBapt[facility] HausmanwSteele SizerD       Future Appointments             In 3 weeks SowSteele SizerD CHMFooth[facility] Surgery Center LPECRacineIn 9 months KowRalene BatheD [place]          esomeprazole (NEXIUM) 20 MG capsule 90 capsule 1    Sig: Take 1 capsule (20 mg total) by mouth daily.     Gastroenterology: Proton Pump Inhibitors 2 Passed - 09/22/2022  4:37 PM      Passed - ALT in normal range and within 360 days    ALT  Date Value Ref Range Status  10/16/2021 8 6 - 29 U/L Final   SGPT (ALT)  Date Value Ref Range Status  02/23/2013 26 12 - 78 U/L Final         Passed - AST in normal range and within 360 days    AST  Date Value Ref Range Status  10/16/2021 14 10 - 35 U/L Final   SGOT(AST)  Date Value Ref Range Status  02/23/2013 34 15 - 37 Unit/L Final         Passed - Valid encounter within last 12 months    Recent Outpatient Visits           4 months ago Lumbar back pain   CHMGroesbeckNP   5 months ago Angina at rest ([facility] CHMLaf[facility] HospitalwSteele SizerD   7 months ago Arthritis  Aurelia, DO   7 months ago Right leg pain   [place], [place]   11 months ago Angina at rest [facility])   [facility] Steele Sizer, MD       Future Appointments             In 3 weeks Steele Sizer, MD [facility]   In 9 months Ralene Bathe, MD [place]

## 2022-09-22 NOTE — Telephone Encounter (Signed)
Pt called in to request a refill for esomeprazole (NEXIUM) 20 MG capsule  butalbital-acetaminophen-caffeine (FIORICET) 50-325-40 MG tablet   Ov: 10/16/22  Pharmacy:  [facility] [place], [place] Phone:  [phone]  Fax:  [phone]

## 2022-09-22 NOTE — Telephone Encounter (Signed)
Pt called in to update provider. Pt says that she didn't request refill for this medication. Pt says that pharmacy requested on their own.

## 2022-10-14 NOTE — Progress Notes (Signed)
Name: Christina Salas   MRN: [id number]    DOB: 1933-01-08   Date:10/16/2022       Progress Note  Subjective  Chief Complaint  Follow Up  HPI  Cervical Radiculopathy: she has decrease rom of neck and  occasionally tingling down to right shoulder. She states her neck is causing problems because she can not look up to walk, we will refer her to PT    Tension headache: she is taking fioricet prn and needs a refill, she describes headaches as dull and or sharp, not as frequent now, she states baclofen also helps when she has a headache but causes drowsiness . Usually frontal, very seldom she has associated  nausea or vomiting. She still has some fioricet at home   Iron deficiency:She was seen by Dr. Durwin Reges back in 11/2018, had EGD and colonoscopy , she also had multiple AVM of small bowel , Dr. Durwin Reges discussed either push enteroscopy versus double balloon enteroscopy, or continue monitoring for anemia and take iron supplementation. She chose the later and last levels were back to normal . Last HCT was normal and iron studies back to normal also, she has not been taking iron supplementation since Dec 2022    Post-menopausal osteoporosis : she used to take Fosamax but stopped going due to having dental work done. Bone density is worse, bone density showed osteoporosis of her hip.    Mild Major Depression: she has long history of depression, she states not feeling great right now, states life is boring, denies suicidal thoughts or ideation, but has lack of energy and also lack of appetite She states her nephew is 18 yo and diagnosed with cancer and he is worried about him   Pre-diabetes: she denies polyphagia or polyuria, she feels her mouth gets dry at times. Last A1C was normal, explained to her that she needs to gain weight not to worry so much about her diet    GERD: controlled with Nexium at this time, she is aware of long term side effects. She denies heart burn or indigestion . She is under  the care of Dr. Allen Norris . Stable   Malnutrition: she lost 20 lbs from pre-pandemic to 2021, she continues to lose weight one year ago her weight was 152 lbs and today is down to 145 lbs. Her baseline weight used to be in the mid 170 lbs for years.  Discussed high calorie diet again. She states taking protein shakes but still avoids sweets. She states she skips meals   Angina: she sees Dr. Clayborn Bigness yearly - next visit in Dec . She states only has symptoms occasionally.   Echo from 2017  NORMAL LEFT VENTRICULAR SYSTOLIC FUNCTION WITH AN ESTIMATED EF = >55 %  NORMAL RIGHT VENTRICULAR SYSTOLIC FUNCTION  MILD TRICUSPID AND MITRAL VALVE INSUFFICIENCY  TRACE AORTIC VALVE INSUFFICIENCY  NO VALVULAR STENOSIS  MILD BIATRIAL ENLARGEMENT   ECG Interpretation: 2017  Rest ECG:  normal sinus rhythm, none  Stress ECG:  normal sinus rhythm,  Recovery ECG:  normal sinus rhythm  ECG Interpretation:  non-diagnostic due to pharmacologic testing  Neuropathy: she states sometimes burning and tingling numbness she tried topical medications, she states pain is not as severe now . Normal B12 and folate done April 2023   Vitamin D :she is taking 2000 units daily  and last level was at goal   Patient Active Problem List   Diagnosis Date Noted   Stricture and stenosis of esophagus    Mild depression  01/24/2019   Large hiatal hernia 01/24/2019   Internal hemorrhoids 01/24/2019   Iron deficiency anemia due to chronic blood loss    Venous reflux 04/19/2018   Prediabetes 06/08/2016   Obesity (BMI 30.0-34.9) 06/08/2016   Vitamin D deficiency 06/08/2016   Post-menopausal 12/11/2015   DD (diverticular disease) 06/19/2015   Edema extremities 06/19/2015   Esophageal reflux 06/19/2015   HLD (hyperlipidemia) 06/19/2015   Varicose veins of leg with swelling, bilateral 06/19/2015   Recurrent UTI 02/08/2013   Mixed incontinence 02/08/2013   History of breast cancer, left, T1, N0, Left MRM - Dec 1996 03/18/2012    Tension headache 05/24/2007   History of melanoma 08/28/1994    Past Surgical History:  Procedure Laterality Date   ABDOMINAL HYSTERECTOMY  2001   BREAST BIOPSY  2010   BREAST EXCISIONAL BIOPSY Right 1997   Benign excisional biopsy    CATARACT EXTRACTION Left 01/30/2021   Dr. Luberta Mutter   COLONOSCOPY  04/02/2014   COLONOSCOPY WITH PROPOFOL N/A 12/27/2018   Procedure: COLONOSCOPY WITH PROPOFOL;  Surgeon: Lucilla Lame, MD;  Location: [facility] ENDOSCOPY;  Service: Endoscopy;  Laterality: N/A;   CYSTOSCOPY  11/2014   ESOPHAGOGASTRODUODENOSCOPY (EGD) WITH PROPOFOL N/A 12/27/2018   Procedure: ESOPHAGOGASTRODUODENOSCOPY (EGD) WITH PROPOFOL;  Surgeon: Lucilla Lame, MD;  Location: [facility] ENDOSCOPY;  Service: Endoscopy;  Laterality: N/A;   ESOPHAGOGASTRODUODENOSCOPY (EGD) WITH PROPOFOL N/A 01/07/2021   Procedure: ESOPHAGOGASTRODUODENOSCOPY (EGD) WITH PROPOFOL;  Surgeon: Lucilla Lame, MD;  Location: [facility] ENDOSCOPY;  Service: Endoscopy;  Laterality: N/A;   GIVENS CAPSULE STUDY N/A 01/07/2021   Procedure: GIVENS CAPSULE STUDY;  Surgeon: Lucilla Lame, MD;  Location: [facility] ENDOSCOPY;  Service: Endoscopy;  Laterality: N/A;   MASTECTOMY  1996   Left   MELANOMA EXCISION  1995   Head    Family History  Problem Relation Age of Onset   Breast cancer Mother    Migraines Mother    Diabetes Father    CAD Father    Thrombosis Father    Aortic aneurysm Sister    Biliary Cirrhosis Brother    Breast cancer Other        Niece    Social History   Tobacco Use   Smoking status: Never   Smokeless tobacco: Never  Substance Use Topics   Alcohol use: No    Alcohol/week: 0.0 standard drinks of alcohol     Current Outpatient Medications:    butalbital-acetaminophen-caffeine (FIORICET) 50-325-40 MG tablet, Take 1 tablet by mouth every 6 (six) hours as needed., Disp: 60 tablet, Rfl: 0   Calcium-Magnesium 500-250 MG TABS, Take 1 tablet by mouth 2 (two) times daily., Disp: , Rfl:    CINNAMON PO, Take 1  capsule by mouth daily. 350 mg, Disp: , Rfl:    Cranberry 500 MG CAPS, Take 1 tablet by mouth daily., Disp: , Rfl:    esomeprazole (NEXIUM) 20 MG capsule, Take 1 capsule (20 mg total) by mouth daily., Disp: 90 capsule, Rfl: 1   Misc Natural Products (LEG VEIN & CIRCULATION) TABS, Take 2 capsules by mouth daily., Disp: , Rfl:    Multiple Vitamins-Calcium (ESSENTIAL ONE DAILY MULTIVIT) TABS, Take by mouth., Disp: , Rfl:    naproxen (NAPROSYN) 500 MG tablet, TAKE 1 TABLET(500 MG) BY MOUTH TWICE DAILY WITH A MEAL, Disp: 20 tablet, Rfl: 0   Omega-3 Fatty Acids (FISH OIL) 1000 MG CAPS, Take by mouth., Disp: , Rfl:    triamcinolone cream (KENALOG) 0.1 %, Apply topically to lower legs 3-5 days a week  for rash. Avoid applying to face, groin, and axilla. Use as directed., Disp: 80 g, Rfl: 3   FEROSUL 325 (65 Fe) MG tablet, TAKE 1 TABLET BY MOUTH DAILY (Patient not taking: Reported on 10/16/2022), Disp: 100 tablet, Rfl: 1  No Known Allergies  I personally reviewed active problem list, medication list, allergies, family history, social history, health maintenance with the patient/caregiver today.   ROS  Ten systems reviewed and is negative except as mentioned in HPI   Objective  Vitals:   10/16/22 1307  BP: 122/76  Pulse: 93  Resp: 16  SpO2: 97%  Weight: 145 lb (65.8 kg)  Height: '4\' 9"'$  (1.448 m)    Body mass index is 31.38 kg/m.  Physical Exam  Constitutional: Patient appears well-developed and well-nourished. Obese  No distress.  HEENT: head atraumatic, normocephalic, pupils equal and reactive to light, neck decrease rom neck, difficulty extending neck , decrease in rotation  Cardiovascular: Normal rate, regular rhythm and normal heart sounds.  No murmur heard. 1 plus BLE edema. Pulmonary/Chest: Effort normal and breath sounds normal. No respiratory distress. Abdominal: Soft.  There is no tenderness. Psychiatric: Patient has a normal mood and affect. behavior is normal. Judgment and  thought content normal.   PHQ2/9:    10/16/2022    1:07 PM 05/21/2022    1:57 PM 04/15/2022    1:56 PM 03/24/2022   10:48 AM 02/20/2022    3:23 PM  Depression screen PHQ 2/9  Decreased Interest 0 0 '1 1 1  '$ Down, Depressed, Hopeless 0 0 0 1 1  PHQ - 2 Score 0 0 '1 2 2  '$ Altered sleeping 3 0 '3 1 1  '$ Tired, decreased energy 2 0 0 1 1  Change in appetite 0 0 0 0 0  Feeling bad or failure about yourself  1 0 1 0 0  Trouble concentrating 0 0 0 0 0  Moving slowly or fidgety/restless 0 0 0 0 0  Suicidal thoughts 0 0 0 0 0  PHQ-9 Score 6 0 '5 4 4  '$ Difficult doing work/chores  Not difficult at all  Not difficult at all Not difficult at all    phq 9 is negative   Fall Risk:    10/16/2022    1:06 PM 05/21/2022    1:52 PM 04/15/2022    1:55 PM 03/24/2022   10:52 AM 02/20/2022    3:23 PM  Fall Risk   Falls in the past year? '1 1 1 1 1  '$ Number falls in past yr: 0 0 0 1 0  Injury with Fall? 1 0 0 0 0  Risk for fall due to : Impaired mobility;Impaired balance/gait Impaired balance/gait;Impaired mobility Impaired balance/gait History of fall(s);Impaired balance/gait;Impaired mobility Impaired balance/gait;Impaired mobility  Follow up Falls prevention discussed  Falls prevention discussed Falls prevention discussed       Functional Status Survey: Is the patient deaf or have difficulty hearing?: Yes Does the patient have difficulty seeing, even when wearing glasses/contacts?: No Does the patient have difficulty concentrating, remembering, or making decisions?: No Does the patient have difficulty walking or climbing stairs?: Yes Does the patient have difficulty dressing or bathing?: Yes Does the patient have difficulty doing errands alone such as visiting a doctor's office or shopping?: No    Assessment & Plan  1. Mild protein-calorie malnutrition (HCC)  - COMPLETE METABOLIC PANEL WITH GFR - CBC with Differential/Platelet  Explained importance of increasing calorie intake, she states poor  appetite - but needs to stop skipping  meals   2. Neuropathy  Unchanged .   3. Hyperglycemia   4. AVM (arteriovenous malformation) of small bowel, acquired  - CBC with Differential/Platelet - Iron, TIBC and Ferritin Panel  5. Prediabetes  Last level was normal, advised to avoid sugar but needs to eat more carbohydrates to gain weight   6. Need for immunization against influenza  - Flu Vaccine QUAD High Dose(Fluad)  7. History of iron deficiency anemia   8. Vitamin D deficiency  - VITAMIN D 25 Hydroxy (Vit-D Deficiency, Fractures)  9. Menopausal osteoporosis  - Ambulatory referral to Endocrinology  10. Tension headache  Taking fioricet prn  11. Gastroesophageal reflux disease without esophagitis  Advised to only take naproxen prn, she states takes it less than once a week  12. Cervical radiculopathy  - Ambulatory referral to Physical Therapy  13. Hypertriglyceridemia  - Lipid panel

## 2022-10-16 ENCOUNTER — Ambulatory Visit: Payer: Medicare PPO | Admitting: Family Medicine

## 2022-10-16 ENCOUNTER — Encounter: Payer: Self-pay | Admitting: Family Medicine

## 2022-10-16 VITALS — BP 122/76 | HR 93 | Resp 16 | Ht <= 58 in | Wt 145.0 lb

## 2022-10-16 DIAGNOSIS — M5412 Radiculopathy, cervical region: Secondary | ICD-10-CM

## 2022-10-16 DIAGNOSIS — Z862 Personal history of diseases of the blood and blood-forming organs and certain disorders involving the immune mechanism: Secondary | ICD-10-CM | POA: Diagnosis not present

## 2022-10-16 DIAGNOSIS — E559 Vitamin D deficiency, unspecified: Secondary | ICD-10-CM | POA: Diagnosis not present

## 2022-10-16 DIAGNOSIS — G629 Polyneuropathy, unspecified: Secondary | ICD-10-CM | POA: Diagnosis not present

## 2022-10-16 DIAGNOSIS — G44209 Tension-type headache, unspecified, not intractable: Secondary | ICD-10-CM

## 2022-10-16 DIAGNOSIS — R7303 Prediabetes: Secondary | ICD-10-CM | POA: Diagnosis not present

## 2022-10-16 DIAGNOSIS — M810 Age-related osteoporosis without current pathological fracture: Secondary | ICD-10-CM

## 2022-10-16 DIAGNOSIS — Z23 Encounter for immunization: Secondary | ICD-10-CM

## 2022-10-16 DIAGNOSIS — K5520 Angiodysplasia of colon without hemorrhage: Secondary | ICD-10-CM

## 2022-10-16 DIAGNOSIS — E781 Pure hyperglyceridemia: Secondary | ICD-10-CM | POA: Diagnosis not present

## 2022-10-16 DIAGNOSIS — R739 Hyperglycemia, unspecified: Secondary | ICD-10-CM | POA: Diagnosis not present

## 2022-10-16 DIAGNOSIS — K219 Gastro-esophageal reflux disease without esophagitis: Secondary | ICD-10-CM

## 2022-10-16 DIAGNOSIS — E441 Mild protein-calorie malnutrition: Secondary | ICD-10-CM | POA: Diagnosis not present

## 2022-10-16 NOTE — Patient Instructions (Signed)
High-Protein and High-Calorie Diet Eating high-protein and high-calorie foods can help you to gain weight, heal after an injury, and recover after an illness or surgery. The specific amount of daily protein and calories you need depends on: Your body weight. The reason this diet is recommended for you. Generally, a high-protein, high-calorie diet involves: Eating 250-500 extra calories each day. Making sure that you get enough of your daily calories from protein. Ask your health care provider how many of your calories should come from protein. Talk with a health care provider or a dietitian about how much protein and how many calories you need each day. Follow the diet as directed by your health care provider. What are tips for following this plan?  Reading food labels Check the nutrition facts label for calories, grams of fat and protein. Items with more than 4 grams of protein are high-protein foods. Preparing meals Add whole milk, half-and-half, or heavy cream to cereal, pudding, soup, or hot cocoa. Add whole milk to instant breakfast drinks. Add peanut butter to oatmeal or smoothies. Add powdered milk to baked goods, smoothies, or milkshakes. Add powdered milk, cream, or butter to mashed potatoes. Add cheese to cooked vegetables. Make whole-milk yogurt parfaits. Top them with granola, fruit, or nuts. Add cottage cheese to fruit. Add avocado, cheese, or both to sandwiches or salads. Add avocado to smoothies. Add meat, poultry, or seafood to rice, pasta, casseroles, salads, and soups. Use mayonnaise when making egg salad, chicken salad, or tuna salad. Use peanut butter as a dip for fruits and vegetables or as a topping for pretzels, celery, or crackers. Add beans to casseroles, dips, and spreads. Add pureed beans to sauces and soups. Replace calorie-free drinks with calorie-containing drinks, such as milk and fruit juice. Replace water with milk or heavy cream when making foods such as  oatmeal, pudding, or cocoa. Add oil or butter to cooked vegetables and grains. Add cream cheese to sandwiches or as a topping on crackers and bread. Make cream-based pastas and soups. General information Ask your health care provider if you should take a nutritional supplement. Try to eat six small meals each day instead of three large meals. A general goal is to eat every 2 to 3 hours. Eat a balanced diet. In each meal, include one food that is high in protein and one food with fat in it. Keep nutritious snacks available, such as nuts, trail mixes, dried fruit, and yogurt. If you have kidney disease or diabetes, talk with your health care provider about how much protein is safe for you. Too much protein may put extra stress on your kidneys. Drink your calories. Choose high-calorie drinks and have them after your meals. Consider setting a timer to remind you to eat. You will want to eat even if you do not feel very hungry. What high-protein foods should I eat?  Vegetables Soybeans. Peas. Grains Quinoa. Bulgur wheat. Buckwheat. Meats and other proteins Beef, pork, and poultry. Fish and seafood. Eggs. Tofu. Textured vegetable protein (TVP). Peanut butter. Nuts and seeds. Dried beans. Protein powders. Hummus. Dairy Whole milk. Whole-milk yogurt. Powdered milk. Cheese. Cottage Cheese. Eggnog. Beverages High-protein supplement drinks. Soy milk. Other foods Protein bars. The items listed above may not be a complete list of foods and beverages you can eat and drink. Contact a dietitian for more information. What high-calorie foods should I eat? Fruits Dried fruit. Fruit leather. Canned fruit in syrup. Fruit juice. Avocado. Vegetables Vegetables cooked in oil or butter. Fried potatoes. Grains   Pasta. Quick breads. Muffins. Pancakes. Ready-to-eat cereal. Meats and other proteins Peanut butter. Nuts and seeds. Dairy Heavy cream. Whipped cream. Cream cheese. Sour cream. Ice cream. Custard.  Pudding. Whole milk dairy products. Beverages Meal-replacement beverages. Nutrition shakes. Fruit juice. Seasonings and condiments Salad dressing. Mayonnaise. Alfredo sauce. Fruit preserves or jelly. Honey. Syrup. Sweets and desserts Cake. Cookies. Pie. Pastries. Candy bars. Chocolate. Fats and oils Butter or margarine. Oil. Gravy. Other foods Meal-replacement bars. The items listed above may not be a complete list of foods and beverages you can eat and drink. Contact a dietitian for more information. Summary A high-protein, high-calorie diet can help you gain weight or heal faster after an injury, illness, or surgery. To increase your protein and calories, add ingredients such as whole milk, peanut butter, cheese, beans, meat, or seafood to meal items. To get enough extra calories each day, include high-calorie foods and beverages at each meal. Adding a high-calorie drink or shake can be an easy way to help you get enough calories each day. Talk with your healthcare provider or dietitian about the best options for you. This information is not intended to replace advice given to you by your health care provider. Make sure you discuss any questions you have with your health care provider. Document Revised: 11/17/2020 Document Reviewed: 11/17/2020 Elsevier Patient Education  2023 Elsevier Inc.  

## 2022-10-17 LAB — CBC WITH DIFFERENTIAL/PLATELET
Absolute Monocytes: 382 cells/uL (ref 200–950)
Basophils Absolute: 20 cells/uL (ref 0–200)
Basophils Relative: 0.3 %
Eosinophils Absolute: 107 cells/uL (ref 15–500)
Eosinophils Relative: 1.6 %
HCT: 38 % (ref 35.0–45.0)
Hemoglobin: 12.8 g/dL (ref 11.7–15.5)
Lymphs Abs: 2513 cells/uL (ref 850–3900)
MCH: 29.8 pg (ref 27.0–33.0)
MCHC: 33.7 g/dL (ref 32.0–36.0)
MCV: 88.4 fL (ref 80.0–100.0)
MPV: 10.8 fL (ref 7.5–12.5)
Monocytes Relative: 5.7 %
Neutro Abs: 3678 cells/uL (ref 1500–7800)
Neutrophils Relative %: 54.9 %
Platelets: 160 10*3/uL (ref 140–400)
RBC: 4.3 10*6/uL (ref 3.80–5.10)
RDW: 13.5 % (ref 11.0–15.0)
Total Lymphocyte: 37.5 %
WBC: 6.7 10*3/uL (ref 3.8–10.8)

## 2022-10-17 LAB — IRON,TIBC AND FERRITIN PANEL
%SAT: 19 % (calc) (ref 16–45)
Ferritin: 36 ng/mL (ref 16–288)
Iron: 65 ug/dL (ref 45–160)
TIBC: 349 mcg/dL (calc) (ref 250–450)

## 2022-10-17 LAB — COMPLETE METABOLIC PANEL WITH GFR
AG Ratio: 1.5 (calc) (ref 1.0–2.5)
ALT: 11 U/L (ref 6–29)
AST: 17 U/L (ref 10–35)
Albumin: 4.3 g/dL (ref 3.6–5.1)
Alkaline phosphatase (APISO): 76 U/L (ref 37–153)
BUN/Creatinine Ratio: 34 (calc) — ABNORMAL HIGH (ref 6–22)
BUN: 31 mg/dL — ABNORMAL HIGH (ref 7–25)
CO2: 27 mmol/L (ref 20–32)
Calcium: 9.8 mg/dL (ref 8.6–10.4)
Chloride: 107 mmol/L (ref 98–110)
Creat: 0.91 mg/dL (ref 0.60–0.95)
Globulin: 2.8 g/dL (calc) (ref 1.9–3.7)
Glucose, Bld: 95 mg/dL (ref 65–99)
Potassium: 4.1 mmol/L (ref 3.5–5.3)
Sodium: 143 mmol/L (ref 135–146)
Total Bilirubin: 0.3 mg/dL (ref 0.2–1.2)
Total Protein: 7.1 g/dL (ref 6.1–8.1)
eGFR: 61 mL/min/{1.73_m2} (ref >=60)

## 2022-10-17 LAB — LIPID PANEL
Cholesterol: 180 mg/dL (ref <200)
HDL: 55 mg/dL (ref >=50)
LDL Cholesterol (Calc): 98 mg/dL (calc)
Non-HDL Cholesterol (Calc): 125 mg/dL (calc) (ref <130)
Total CHOL/HDL Ratio: 3.3 (calc) (ref <5.0)
Triglycerides: 169 mg/dL — ABNORMAL HIGH (ref <150)

## 2022-10-17 LAB — VITAMIN D 25 HYDROXY (VIT D DEFICIENCY, FRACTURES): Vit D, 25-Hydroxy: 40 ng/mL (ref 30–100)

## 2022-10-20 ENCOUNTER — Telehealth: Payer: Self-pay | Admitting: Family Medicine

## 2022-10-20 NOTE — Telephone Encounter (Signed)
Copied from [place]. Topic: General - Other >> Oct 20, 2022 12:00 PM Ja-Kwan M wrote: Reason for CRM: Pt would like to make Dr. Ancil Boozer aware that she has an appt with Dr. Bettey Costa on 11/17/22. Cb# [phone]

## 2022-10-20 NOTE — Telephone Encounter (Signed)
Copied from [place] [id number]. Topic: Referral - Request for Referral >> Oct 20, 2022  8:59 AM Ludger Nutting wrote: Has patient seen PCP for this complaint? Yes.   Referral for which specialty: Ortho Preferred provider/office: [facility] - Dr. Thornton Park Reason for referral: Neck Pain

## 2022-10-20 NOTE — Telephone Encounter (Signed)
Pt called back and wants to decline this request, says she no longer needs it

## 2022-10-21 DIAGNOSIS — L851 Acquired keratosis [keratoderma] palmaris et plantaris: Secondary | ICD-10-CM | POA: Diagnosis not present

## 2022-10-21 DIAGNOSIS — M79675 Pain in left toe(s): Secondary | ICD-10-CM | POA: Diagnosis not present

## 2022-10-21 DIAGNOSIS — M79674 Pain in right toe(s): Secondary | ICD-10-CM | POA: Diagnosis not present

## 2022-10-21 DIAGNOSIS — B351 Tinea unguium: Secondary | ICD-10-CM | POA: Diagnosis not present

## 2022-11-11 ENCOUNTER — Ambulatory Visit: Payer: Medicare PPO | Attending: Family Medicine | Admitting: Physical Therapy

## 2022-11-11 ENCOUNTER — Encounter: Payer: Self-pay | Admitting: Physical Therapy

## 2022-11-11 DIAGNOSIS — M542 Cervicalgia: Secondary | ICD-10-CM | POA: Diagnosis not present

## 2022-11-11 DIAGNOSIS — M436 Torticollis: Secondary | ICD-10-CM | POA: Diagnosis not present

## 2022-11-11 DIAGNOSIS — M5412 Radiculopathy, cervical region: Secondary | ICD-10-CM | POA: Diagnosis not present

## 2022-11-11 NOTE — Therapy (Addendum)
OUTPATIENT PHYSICAL THERAPY CERVICAL EVALUATION   Patient Name: Christina Salas MRN: [id number] DOB:07-05-1933, 88 y.o., female Today's Date: 11/13/2022     Past Medical History:  Diagnosis Date   Abnormal glucose    Anxiety    Arthritis    Breast cancer ([place]) 1996   Left- mastectomy   Cancer ([place])    melanoma   Chronic headaches    Depression    Dysplastic nevus 05/11/2007   right upper gastric area. Slight to moderate atypia.   Dysplastic nevus 06/24/2020   R lateral deltoid, moderate to severe atypia. Excised 08/13/2020, margins free.   GERD (gastroesophageal reflux disease)    H/O melanoma excision    History of diverticulitis    History of recurrent UTIs    Hypertension    Melanoma ([place])    scalp   Osteoporosis    pre-osetoporosis per patient history form 03/18/12   Pre-diabetes    Vitamin D deficiency    Past Surgical History:  Procedure Laterality Date   ABDOMINAL HYSTERECTOMY  2001   BREAST BIOPSY  2010   BREAST EXCISIONAL BIOPSY Right 1997   Benign excisional biopsy    CATARACT EXTRACTION Left 01/30/2021   Dr. Luberta Mutter   COLONOSCOPY  04/02/2014   COLONOSCOPY WITH PROPOFOL N/A 12/27/2018   Procedure: COLONOSCOPY WITH PROPOFOL;  Surgeon: Lucilla Lame, MD;  Location: [facility] ENDOSCOPY;  Service: Endoscopy;  Laterality: N/A;   CYSTOSCOPY  11/2014   ESOPHAGOGASTRODUODENOSCOPY (EGD) WITH PROPOFOL N/A 12/27/2018   Procedure: ESOPHAGOGASTRODUODENOSCOPY (EGD) WITH PROPOFOL;  Surgeon: Lucilla Lame, MD;  Location: [facility] ENDOSCOPY;  Service: Endoscopy;  Laterality: N/A;   ESOPHAGOGASTRODUODENOSCOPY (EGD) WITH PROPOFOL N/A 01/07/2021   Procedure: ESOPHAGOGASTRODUODENOSCOPY (EGD) WITH PROPOFOL;  Surgeon: Lucilla Lame, MD;  Location: [facility] ENDOSCOPY;  Service: Endoscopy;  Laterality: N/A;   GIVENS CAPSULE STUDY N/A 01/07/2021   Procedure: GIVENS CAPSULE STUDY;  Surgeon: Lucilla Lame, MD;  Location: [facility] ENDOSCOPY;  Service: Endoscopy;  Laterality: N/A;    MASTECTOMY  1996   Left   MELANOMA EXCISION  1995   Head   Patient Active Problem List   Diagnosis Date Noted   Menopausal osteoporosis 10/16/2022   History of iron deficiency anemia 10/16/2022   AVM (arteriovenous malformation) of small bowel, acquired 10/16/2022   Neuropathy 10/16/2022   Mild protein-calorie malnutrition ([place]) 10/16/2022   Stricture and stenosis of esophagus    Mild depression 01/24/2019   Large hiatal hernia 01/24/2019   Internal hemorrhoids 01/24/2019   Iron deficiency anemia due to chronic blood loss    Venous reflux 04/19/2018   Prediabetes 06/08/2016   Obesity (BMI 30.0-34.9) 06/08/2016   Vitamin D deficiency 06/08/2016   Post-menopausal 12/11/2015   Hyperglycemia 10/21/2015   DD (diverticular disease) 06/19/2015   Edema extremities 06/19/2015   Esophageal reflux 06/19/2015   HLD (hyperlipidemia) 06/19/2015   Varicose veins of leg with swelling, bilateral 06/19/2015   Recurrent UTI 02/08/2013   Mixed incontinence 02/08/2013   History of breast cancer, left, T1, N0, Left MRM - Dec 1996 03/18/2012   Tension headache 05/24/2007   History of melanoma 08/28/1994    PCP: Steele Sizer, MD  REFERRING PROVIDER: Steele Sizer, MD  REFERRING DIAG: M54.12 (ICD-10-CM) - Cervical radiculopathy   THERAPY DIAG:  Stiffness of cervical spine  Cervicalgia  Rationale for Evaluation and Treatment: Rehabilitation  ONSET DATE: Chronic insidious onset  SUBJECTIVE:  SUBJECTIVE STATEMENT: Osteoporosis osteopenia; necks gotten worse, walking, fatigue when walking, single point cane, weakness, drove to clinic today avoids the highways; had pt at the hospital.  PERTINENT HISTORY:  Patient is a 88 year old female referred for cervical radiculopathy. Pt reports being  diagnosed with osteoporosis and osteopenia and can tell neck has gotten worse. Pt is retired and lives alone with 1 step to get into the house and 1 step in the house. Pt is independent with ADL but sees the need for help coming in the near future especially with cleaning the house. Pt reports a lot of the house is cluttered. Pt recalls experiencing therapy once before at the hospital with good outcome. Pt is able to drive to therapy today despite demonstrating decreased cervical spine ROM. Pt has been ambulating the community for about a year and a half with the self standing single point cane.   PAIN:  Are you having pain? Yes: NPRS scale: 0/10 Pain location: cervical spine (C3-C6) Pain description: TBA Aggravating factors: extension, rotation Relieving factors: return to normal posture  PRECAUTIONS: Fall  WEIGHT BEARING RESTRICTIONS: No  FALLS:  Has patient fallen in last 6 months? Yes. Number of falls fell on mothers day, fell on her back   LIVING ENVIRONMENT: Lives with: lives alone Lives in: House/apartment Stairs: Yes: Internal: 1 steps; none and External: 1 steps; on right going up Has following equipment at home: Single point cane Started use a year and a half ago after fall OCCUPATION: retired since 2012  PLOF: Independent with basic ADLs and Needs assistance with gait  PATIENT GOALS: Improve posture, stand up straight while walking, improved independence with activities reaching above head.   OBJECTIVE:   DIAGNOSTIC FINDINGS:  DXA completed this year indicating osteopenia     PATIENT SURVEYS:  FOTO 35 goal of 34  COGNITION: Overall cognitive status: Within functional limits for tasks assessed  SENSATION: Not tested  POSTURE: rounded shoulders, forward head, increased thoracic kyphosis, and increased cervical kyphosis  PALPATION: No tenderness in suboccipital region.    CERVICAL ROM:   Active ROM A/PROM (deg) eval  Flexion 40  Extension 34  Right  lateral flexion 11  Left lateral flexion 20  Right rotation 24  Left rotation 37   (Blank rows = not tested) Thoracic AROM:  Ext 100% limited Rotation 75% limited bilat  UPPER EXTREMITY ROM:  Active ROM Right eval Left eval  Shoulder flexion [facility]  Shoulder extension [facility]  Shoulder abduction [facility]  Shoulder adduction        Shoulder internal rotation    Shoulder external rotation    Elbow flexion    Elbow extension    Wrist flexion    Wrist extension    Wrist ulnar deviation    Wrist radial deviation    Wrist pronation    Wrist supination     (Blank rows = not tested)  UPPER EXTREMITY MMT:  MMT Right eval Left eval  Shoulder flexion 4 3  Shoulder extension 4 4  Shoulder abduction 4+ 4+  Shoulder adduction    Shoulder extension    Shoulder internal rotation 3 3  Shoulder external rotation 3 3  Middle trapezius 3 3  Lower trapezius 2+ 2+  Elbow flexion    Elbow extension    Wrist flexion    Wrist extension    Wrist ulnar deviation    Wrist radial deviation    Wrist pronation    Wrist supination  Grip strength     (Blank rows = not tested)  FUNCTIONAL MOVEMENTS: Sit to stand transfer was observed during ambulating in clinic and an increased UE strategy was demonstrated modI Sit to supine transfer modI with increased time needed; unable to complete supine laying due to thoracic kyphosis inhibiting head on table Supine to sit transfer modI with increased time needed Standing to prone transfer was very difficult and painful for the patient and required min assist due to increased thoracic and cervical kyphosis.   CERVICAL SPECIAL TESTS:  Spurling's test: Negative and Cervical Quadrant test: negative bilaterally    TODAY'S TREATMENT:                                                                                                                                Today's treatment included a examination and evaluation of cervical spine, UEs and  thoracic spine followed by therapeutic exercise prescription.  Stair negotiation 1 set ascending and descending 4 steps Gait observation 175 feet Cervical Rotation SNAGs bilaterally 5 reps; Pt requires multiple demonstrations, verbal cues, and tactile cues for proper performance  Shoulder Bracing; 1 set, 10 reps; pt cued for upright posture Seated chin tucks; 1 set, 10 reps; pt cued for avoiding cervical extension motion    PATIENT EDUCATION:  Education details: Patient was educated on diagnosis, anatomy and pathology involved, prognosis, role of PT, and was given an HEP, demonstrating exercise with proper form following verbal and tactile cues, and was given a paper hand out to continue exercise at home. Pt was educated on and agreed to plan of care.  Person educated: Patient Education method: Explanation, Demonstration, Tactile cues, Verbal cues, and Handouts Education comprehension: verbalized understanding, returned demonstration, verbal cues required, and tactile cues required  HOME EXERCISE PROGRAM:  To be completed daily: Shoulder Bracing; 3 sets, 12-20 reps Seated chin tucks; 3 sets, 12-20 reps Seated cervical rotation snags bilaterally 2 sets of 10 reps  ASSESSMENT:  CLINICAL IMPRESSION: Patient is a 88 y.o. female who was seen today for physical therapy evaluation and treatment for cervical spine pathology. Pt demonstrates limited ROM of cervical spine in all directions, especially in extension and R rotation. Pt demonstrates weakness in parascapular and UE musculature. Pt does not report pain while at rest and during most movements just stiffness. Pt gait cycle and stair ambulation for fall risk and to evaluate evaluate community ambulation. Pt has always held cane in dominant hand (R) and demonstrates more confidence by taking longer strides and increasing gait speed during walking with use of AD but is able to ambulate without the AD with shorter strides and decreased speed. Pt  uses nonreciprocal gait pattern to negotiate stairs with use of handrail on R side ascending and descending, however, pt shows ability to use alternating pattern ascending stairs on one step.  Patient will benefit from skilled physical therapy to improve cervical ROM, shoulder strength and parascaupular strength for increased independence with  driving and other ADL for improved community ambulation and QoL.   OBJECTIVE IMPAIRMENTS: Abnormal gait, decreased activity tolerance, decreased balance, decreased coordination, decreased endurance, decreased mobility, difficulty walking, decreased ROM, decreased strength, and hypomobility.   ACTIVITY LIMITATIONS: carrying, lifting, bending, sitting, standing, squatting, stairs, transfers, bed mobility, and reach over head  PARTICIPATION LIMITATIONS: meal prep, cleaning, laundry, driving, shopping, community activity, and yard work  PERSONAL FACTORS: Age, Fitness, and Time since onset of injury/illness/exacerbation are also affecting patient's functional outcome.   REHAB POTENTIAL: Fair    CLINICAL DECISION MAKING: Evolving/moderate complexity  EVALUATION COMPLEXITY: Moderate   GOALS: Goals reviewed with patient? No  SHORT TERM GOALS: Target date:  12/09/2022    Pt will be independent with HEP in order to improve strength and balance in order to decrease fall risk and improve function at home and work.  Baseline: 11/11/2022: HEP given Goal status: INITIAL    LONG TERM GOALS: Target date:  01/06/2023  Patient will increase FOTO score to 49 to demonstrate predicted increase in functional mobility to complete ADLs Baseline: 11/11/2022:35 Goal status: INITIAL  2.  Pt will increase cervical AROM by 11 degrees in all directions to demonstrate clinical significant increased AROM for increased independence with driving and activities involving reaching overhead for improved community ambulation. Baseline: See eval records, normal ranges not  expected to be achieved Goal status: INITIAL  3.  Pt will increase parascapular MMT level to at least 4/5 and all bilateral shoulder MMT levels to at least 4+/5 for increased independence with ADL. Baseline: See eval records Goal status: INITIAL       PLAN:  PT FREQUENCY: 1-2x/week  PT DURATION: 8 weeks  PLANNED INTERVENTIONS: Therapeutic exercises, Therapeutic activity, Neuromuscular re-education, Balance training, Gait training, Patient/Family education, Self Care, Joint mobilization, and Manual therapy  PLAN FOR NEXT SESSION: postural restoration of FHRS length/tension relationships   Durwin Reges, PT 11/13/2022, 9:03 AM

## 2022-11-12 ENCOUNTER — Encounter: Payer: Self-pay | Admitting: Physical Therapy

## 2022-11-16 ENCOUNTER — Ambulatory Visit: Payer: Medicare PPO | Admitting: Physical Therapy

## 2022-11-23 ENCOUNTER — Ambulatory Visit: Payer: Medicare PPO | Admitting: Physical Therapy

## 2022-11-23 ENCOUNTER — Encounter: Payer: Medicare PPO | Admitting: Physical Therapy

## 2022-11-26 ENCOUNTER — Encounter: Payer: Medicare PPO | Admitting: Physical Therapy

## 2022-12-01 ENCOUNTER — Encounter: Payer: Medicare PPO | Admitting: Physical Therapy

## 2022-12-03 ENCOUNTER — Encounter: Payer: Medicare PPO | Admitting: Physical Therapy

## 2022-12-06 IMAGING — US US BREAST*R* LIMITED INC AXILLA
1 series · 13 of 13 positions shown · non-contrast
Comparison: Prior films

CLINICAL DATA: Callback from screening mammogram for possible
masses right breast

EXAM:
DIGITAL DIAGNOSTIC UNILATERAL RIGHT MAMMOGRAM WITH TOMOSYNTHESIS AND
CAD; ULTRASOUND RIGHT BREAST LIMITED
TECHNIQUE: Right digital diagnostic mammography and breast tomosynthesis was
performed. The images were evaluated with computer-aided detection.;
Targeted ultrasound examination of the right breast was performed

[Series 1: us breast*right* limited inc axilla · 0.03mm/px · 13 of 13 slices shown]
[im 1/13]
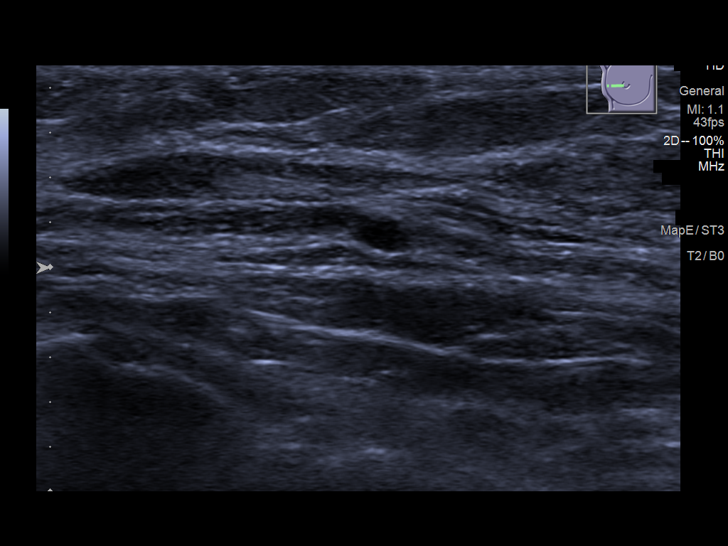
[im 2/13]
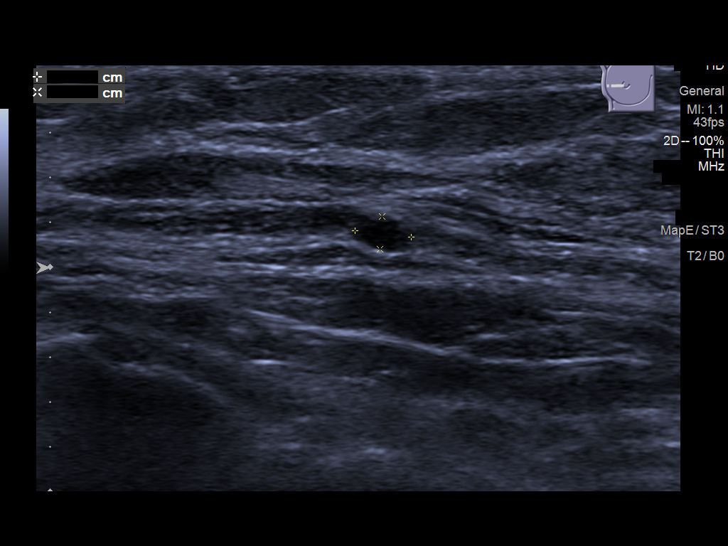
[im 3/13]
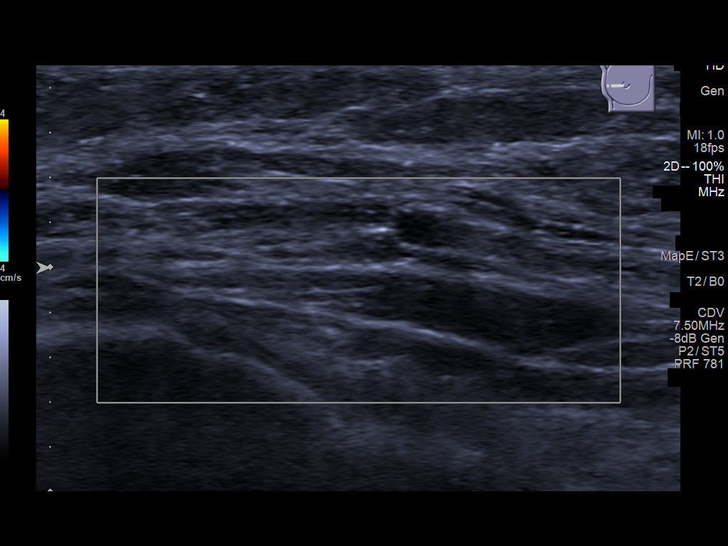
[im 4/13]
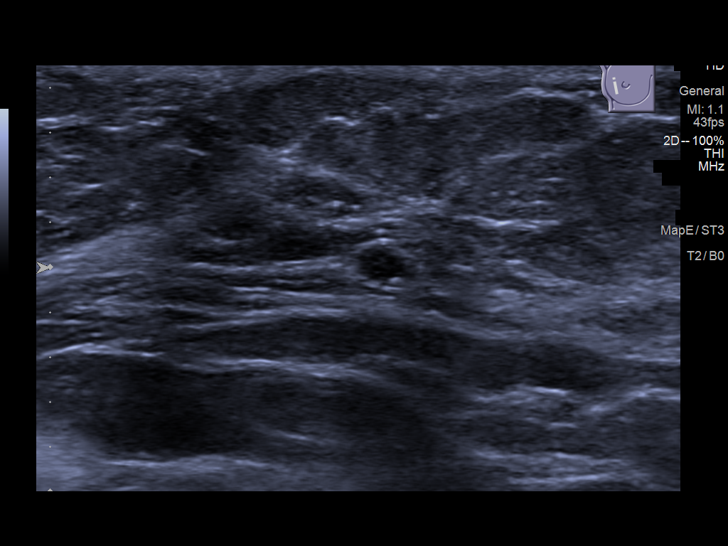
[im 5/13]
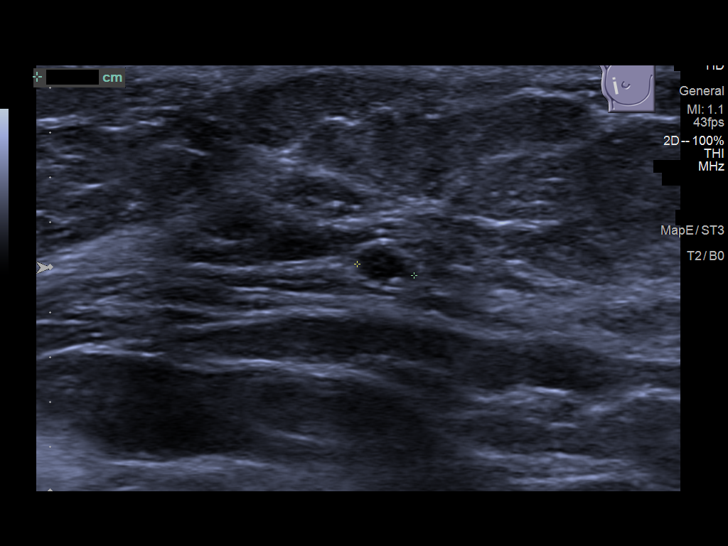
[im 6/13]
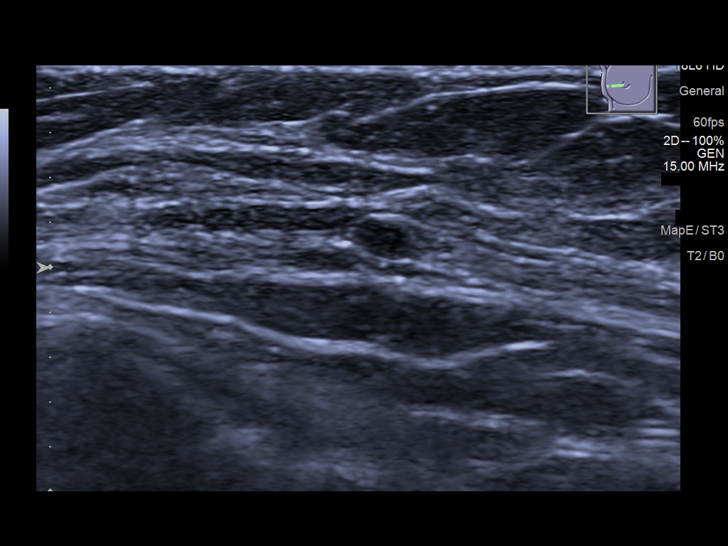
[im 7/13]
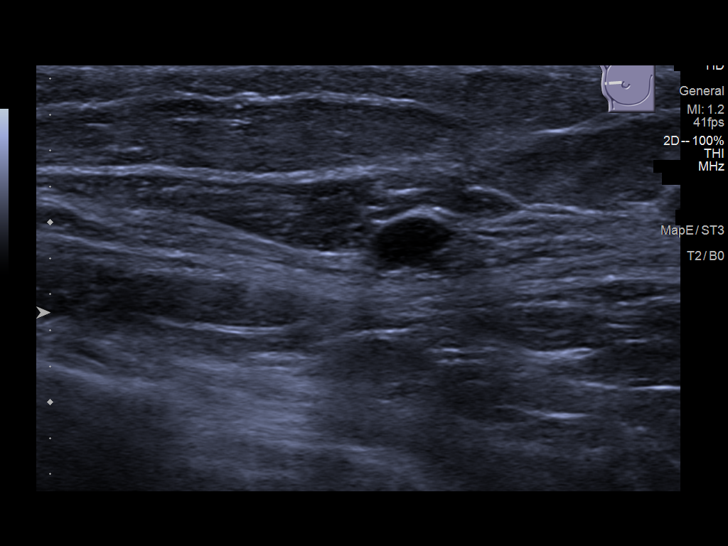
[im 8/13]
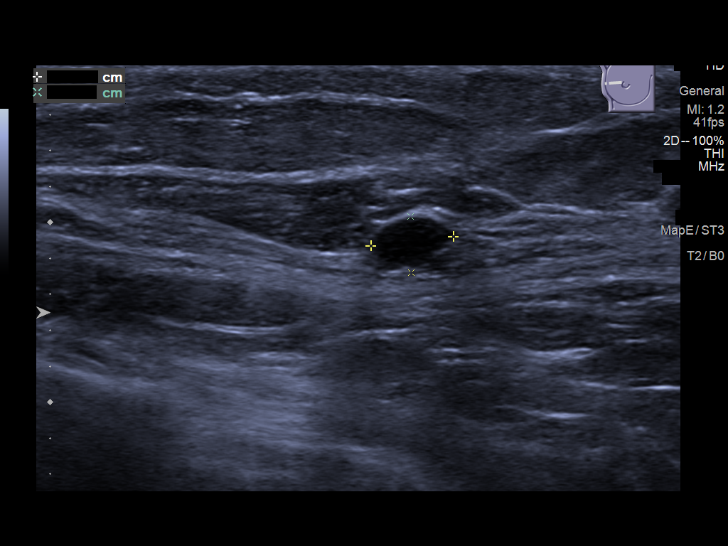
[im 9/13]
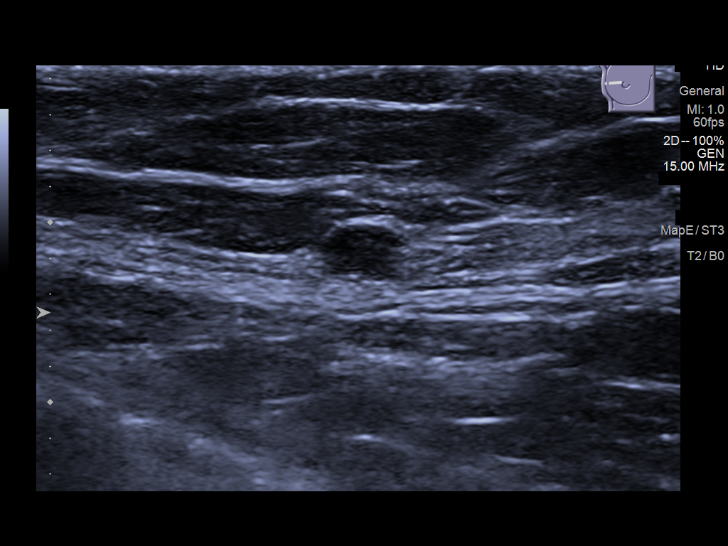
[im 10/13]
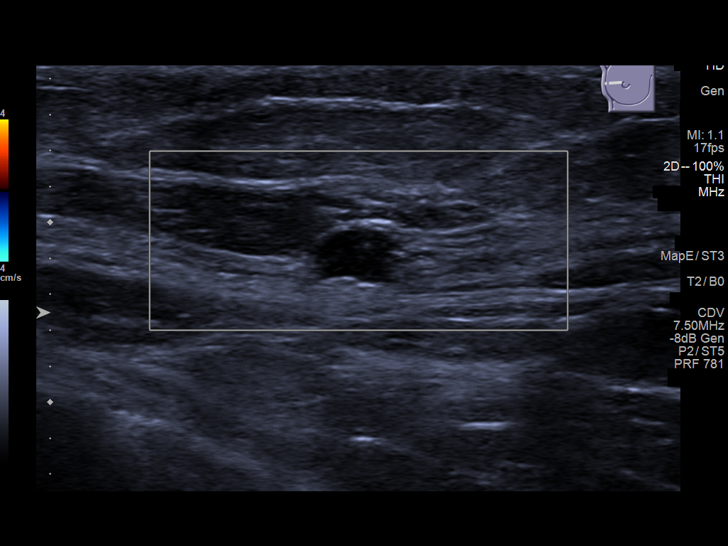
[im 11/13]
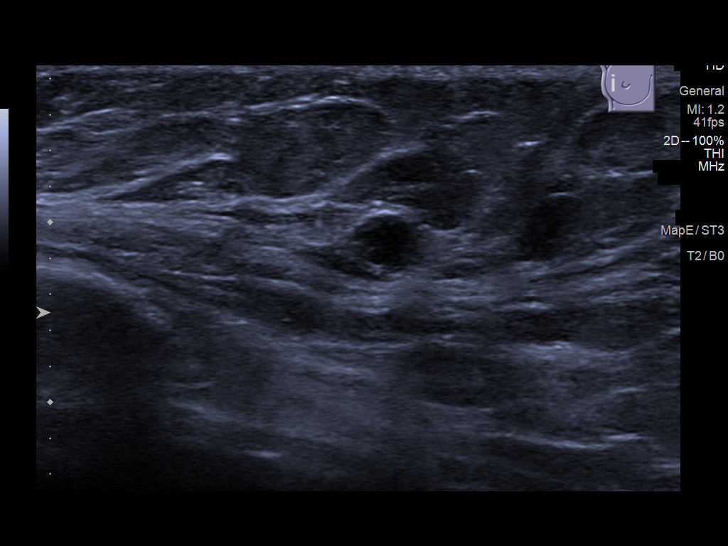
[im 12/13]
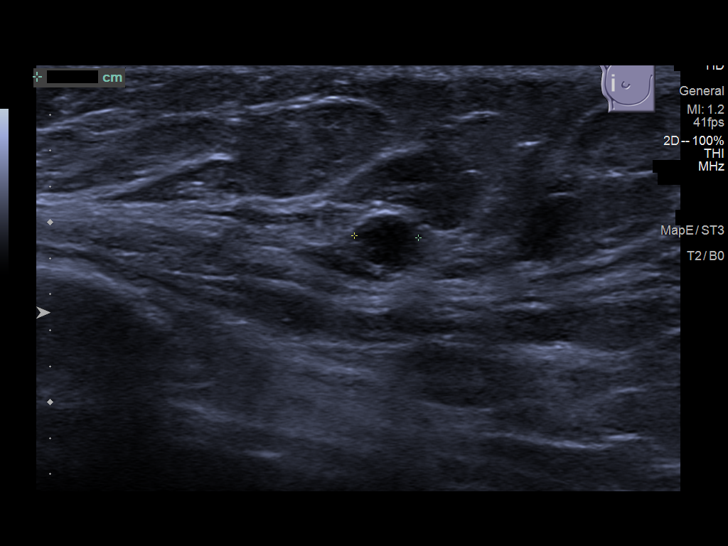
[im 13/13]
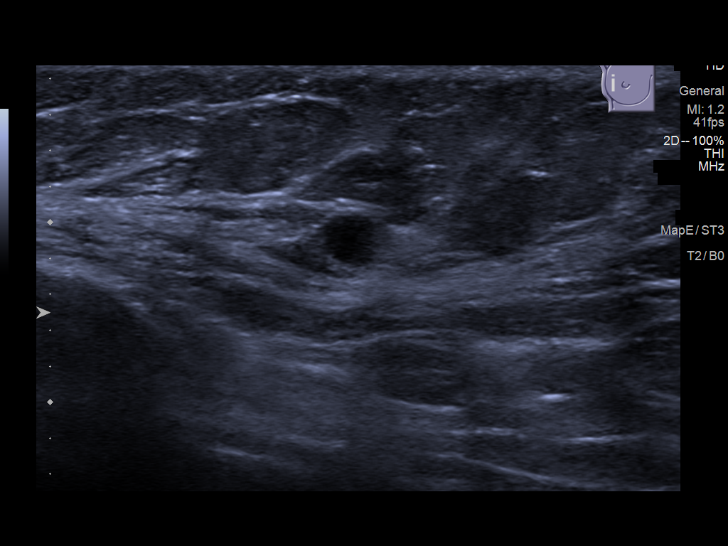

[13 of 13 positions shown; findings below may reference images not displayed]

ACR Breast Density Category b: There are scattered areas of
fibroglandular density.
FINDINGS: Lateral view of right breast, spot compression cc and MLO views of
the right breast are submitted. Previously questioned masses in the
lateral posterior right breast are persistent.

Targeted ultrasound is performed, showing 5 mm simple cyst at the
right breast 9 o'clock 7 cm from nipple. There is an adjacent 3 mm
simple cyst at the right breast 9 o'clock 3 cm from nipple. These
correlate to the mammographic findings.
IMPRESSION: Benign findings.

RECOMMENDATION:
Routine screening mammogram back on schedule.

I have discussed the findings and recommendations with the patient.
If applicable, a reminder letter will be sent to the patient
regarding the next appointment.

BI-RADS CATEGORY  2: Benign.

## 2022-12-06 IMAGING — MG MM DIGITAL DIAGNOSTIC UNILAT*R* W/ TOMO W/ CAD
6 series · 6 of 18 positions shown · non-contrast
Comparison: Prior films

CLINICAL DATA: Callback from screening mammogram for possible
masses right breast

EXAM:
DIGITAL DIAGNOSTIC UNILATERAL RIGHT MAMMOGRAM WITH TOMOSYNTHESIS AND
CAD; ULTRASOUND RIGHT BREAST LIMITED
TECHNIQUE: Right digital diagnostic mammography and breast tomosynthesis was
performed. The images were evaluated with computer-aided detection.;
Targeted ultrasound examination of the right breast was performed

[R CC synth-2D]
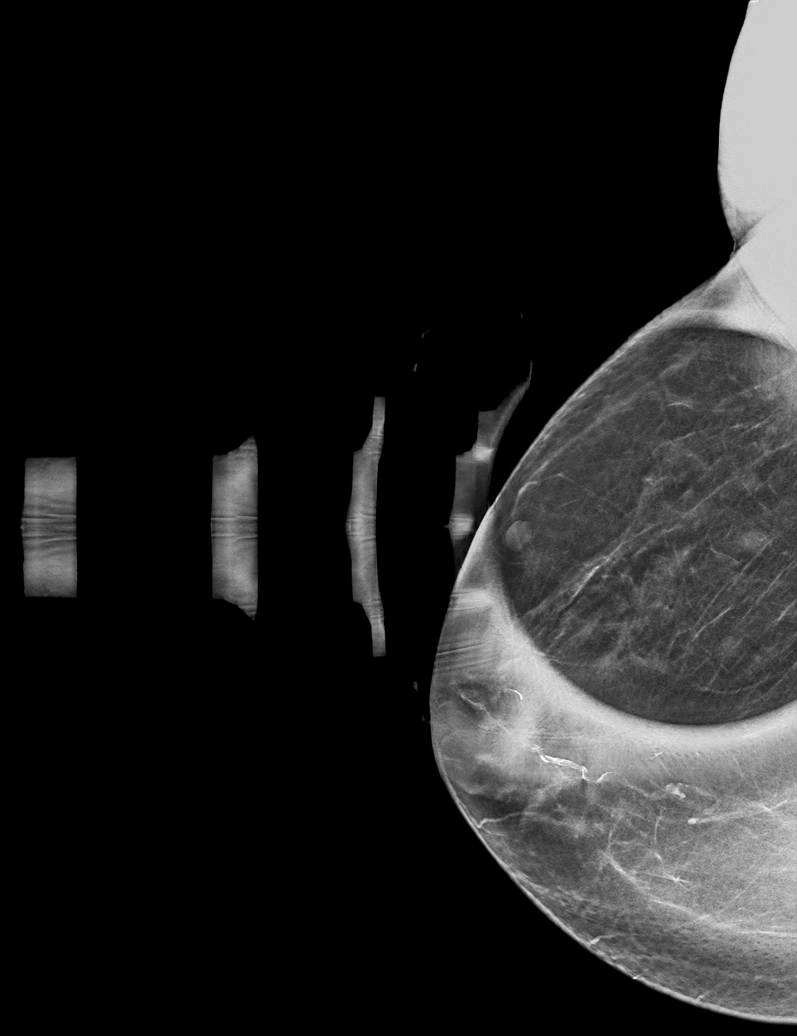

[R ML synth-2D]
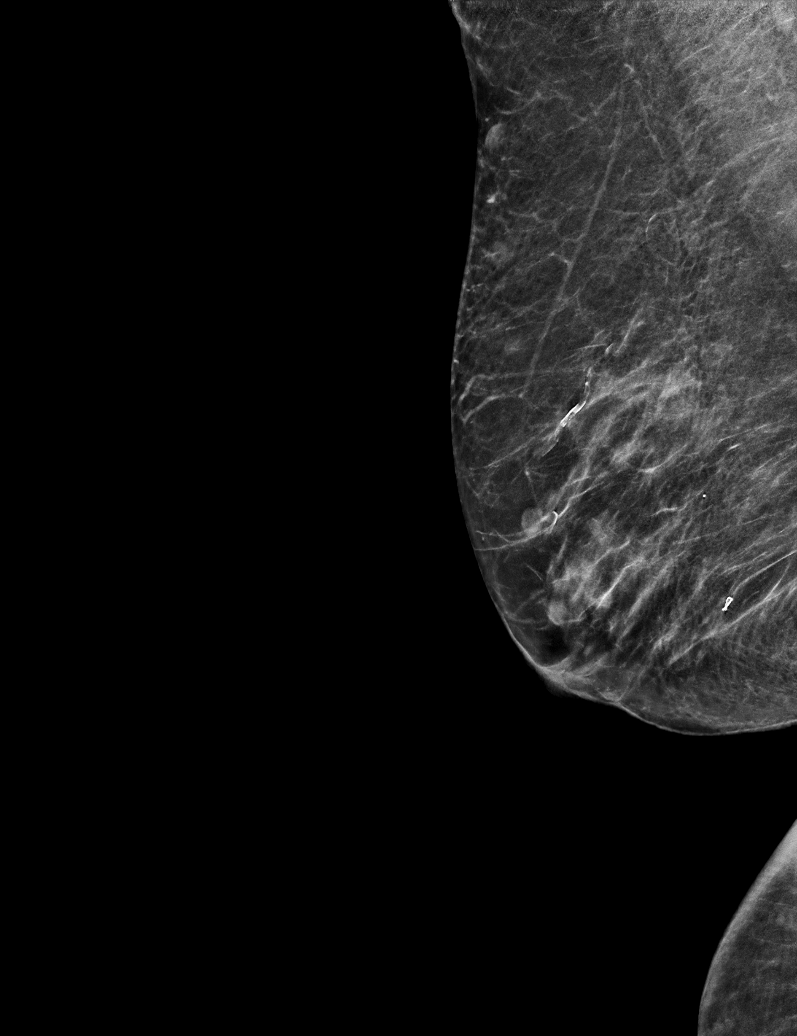

[R SIO synth-2D]
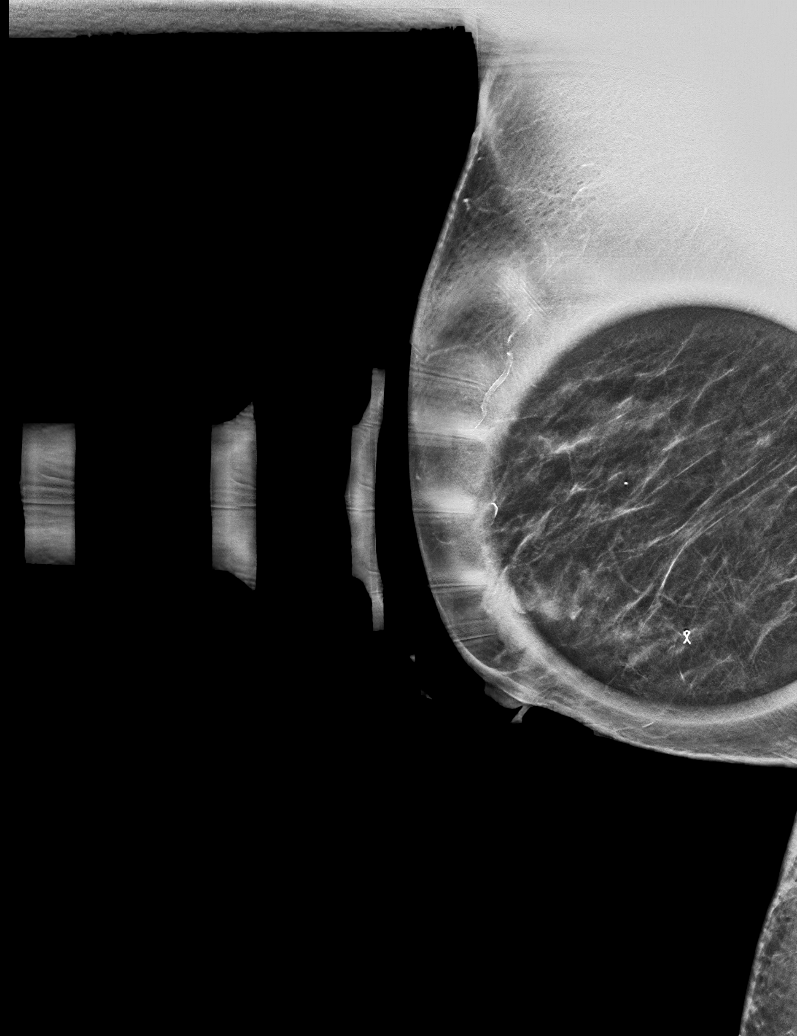

[R ML tomo · tomo slice 28/55.0]
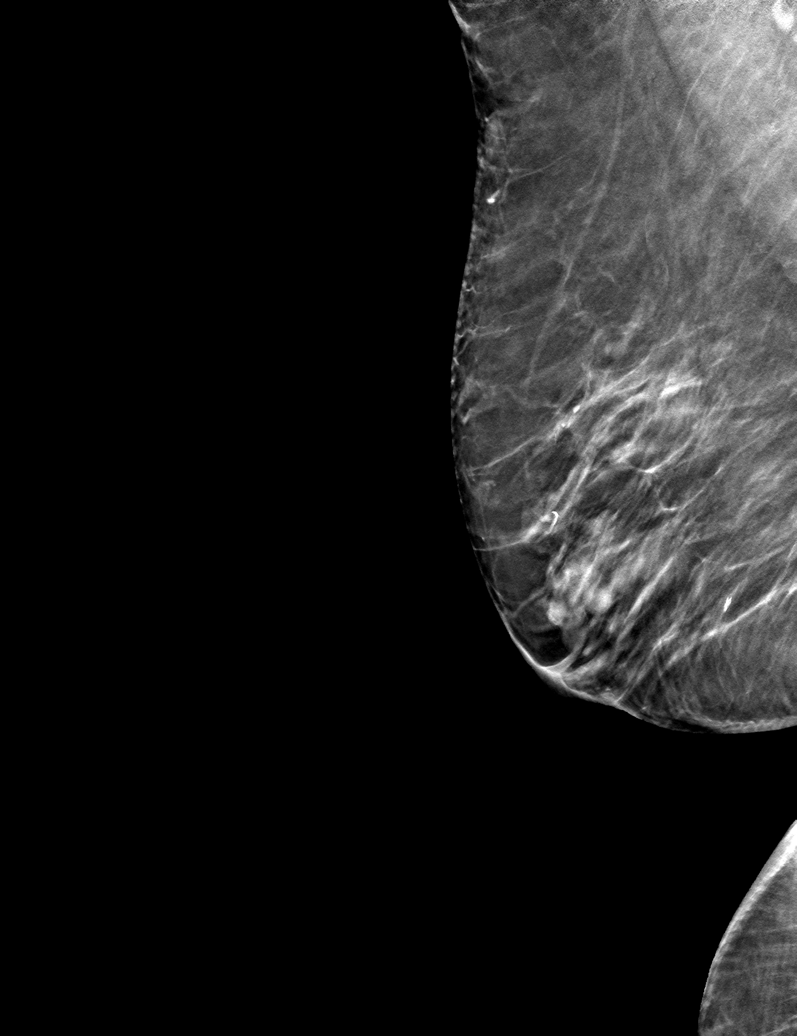

[R CC tomo · tomo slice 25/48.0]
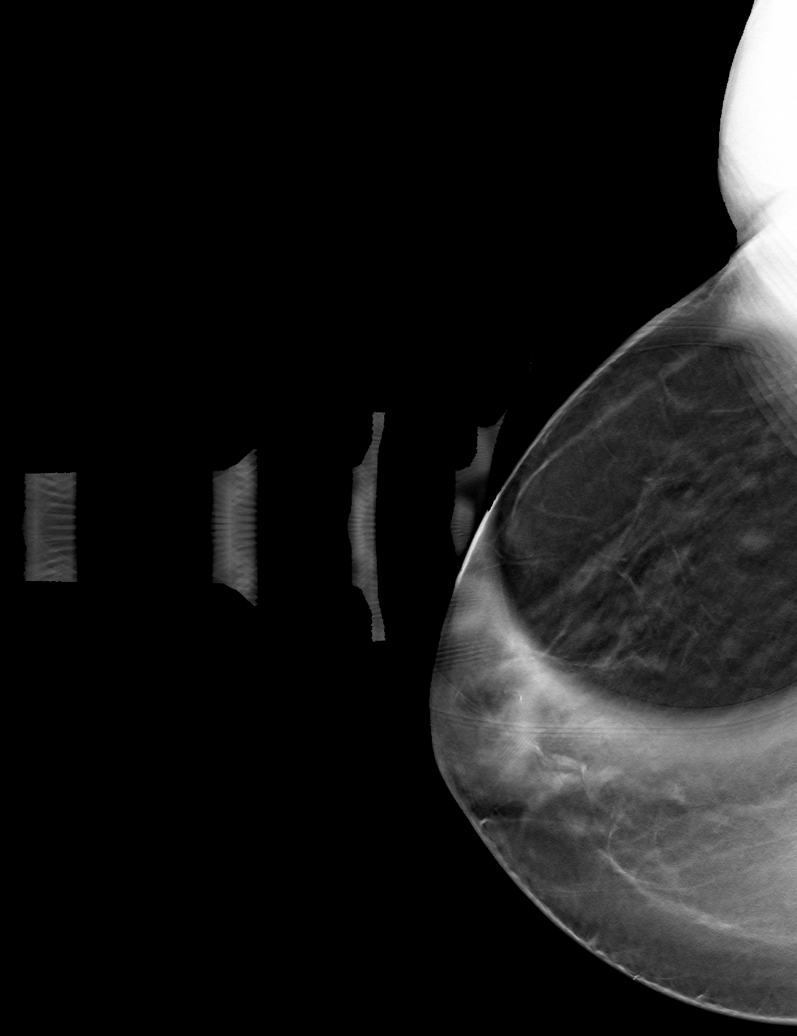

[R SIO tomo · tomo slice 25/50.0]
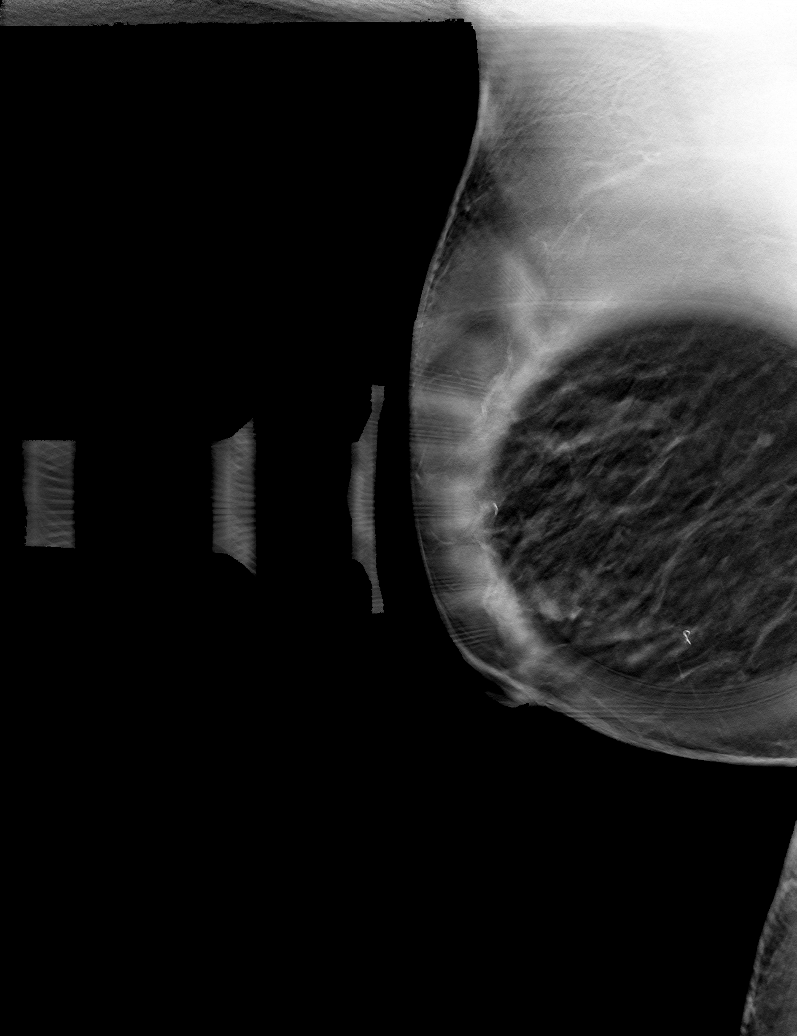

[6 of 18 positions shown; findings below may reference images not displayed]

ACR Breast Density Category b: There are scattered areas of
fibroglandular density.
FINDINGS: Lateral view of right breast, spot compression cc and MLO views of
the right breast are submitted. Previously questioned masses in the
lateral posterior right breast are persistent.

Targeted ultrasound is performed, showing 5 mm simple cyst at the
right breast 9 o'clock 7 cm from nipple. There is an adjacent 3 mm
simple cyst at the right breast 9 o'clock 3 cm from nipple. These
correlate to the mammographic findings.
IMPRESSION: Benign findings.

RECOMMENDATION:
Routine screening mammogram back on schedule.

I have discussed the findings and recommendations with the patient.
If applicable, a reminder letter will be sent to the patient
regarding the next appointment.

BI-RADS CATEGORY  2: Benign.

## 2022-12-08 ENCOUNTER — Encounter: Payer: Medicare PPO | Admitting: Physical Therapy

## 2022-12-09 DIAGNOSIS — F418 Other specified anxiety disorders: Secondary | ICD-10-CM | POA: Diagnosis not present

## 2022-12-09 DIAGNOSIS — G4489 Other headache syndrome: Secondary | ICD-10-CM | POA: Diagnosis not present

## 2022-12-09 DIAGNOSIS — K219 Gastro-esophageal reflux disease without esophagitis: Secondary | ICD-10-CM | POA: Diagnosis not present

## 2022-12-09 DIAGNOSIS — M8000XA Age-related osteoporosis with current pathological fracture, unspecified site, initial encounter for fracture: Secondary | ICD-10-CM | POA: Diagnosis not present

## 2022-12-09 DIAGNOSIS — D508 Other iron deficiency anemias: Secondary | ICD-10-CM | POA: Diagnosis not present

## 2022-12-09 DIAGNOSIS — E7801 Familial hypercholesterolemia: Secondary | ICD-10-CM | POA: Diagnosis not present

## 2022-12-09 DIAGNOSIS — K2100 Gastro-esophageal reflux disease with esophagitis, without bleeding: Secondary | ICD-10-CM | POA: Diagnosis not present

## 2022-12-10 ENCOUNTER — Encounter: Payer: Medicare PPO | Admitting: Physical Therapy

## 2022-12-15 ENCOUNTER — Encounter: Payer: Medicare PPO | Admitting: Physical Therapy

## 2022-12-17 ENCOUNTER — Encounter: Payer: Medicare PPO | Admitting: Physical Therapy

## 2022-12-22 ENCOUNTER — Encounter: Payer: Medicare PPO | Admitting: Physical Therapy

## 2022-12-24 ENCOUNTER — Encounter: Payer: Medicare PPO | Admitting: Physical Therapy

## 2023-01-04 DIAGNOSIS — K2100 Gastro-esophageal reflux disease with esophagitis, without bleeding: Secondary | ICD-10-CM | POA: Diagnosis not present

## 2023-01-04 DIAGNOSIS — G4489 Other headache syndrome: Secondary | ICD-10-CM | POA: Diagnosis not present

## 2023-01-04 DIAGNOSIS — M8000XA Age-related osteoporosis with current pathological fracture, unspecified site, initial encounter for fracture: Secondary | ICD-10-CM | POA: Diagnosis not present

## 2023-01-04 DIAGNOSIS — D508 Other iron deficiency anemias: Secondary | ICD-10-CM | POA: Diagnosis not present

## 2023-01-04 DIAGNOSIS — E7801 Familial hypercholesterolemia: Secondary | ICD-10-CM | POA: Diagnosis not present

## 2023-01-18 DIAGNOSIS — K2100 Gastro-esophageal reflux disease with esophagitis, without bleeding: Secondary | ICD-10-CM | POA: Diagnosis not present

## 2023-01-18 DIAGNOSIS — E7801 Familial hypercholesterolemia: Secondary | ICD-10-CM | POA: Diagnosis not present

## 2023-01-18 DIAGNOSIS — D508 Other iron deficiency anemias: Secondary | ICD-10-CM | POA: Diagnosis not present

## 2023-01-18 DIAGNOSIS — F418 Other specified anxiety disorders: Secondary | ICD-10-CM | POA: Diagnosis not present

## 2023-01-18 DIAGNOSIS — M8000XA Age-related osteoporosis with current pathological fracture, unspecified site, initial encounter for fracture: Secondary | ICD-10-CM | POA: Diagnosis not present

## 2023-01-18 DIAGNOSIS — G4489 Other headache syndrome: Secondary | ICD-10-CM | POA: Diagnosis not present

## 2023-01-22 ENCOUNTER — Other Ambulatory Visit: Payer: Self-pay | Admitting: Family Medicine

## 2023-01-22 DIAGNOSIS — G44209 Tension-type headache, unspecified, not intractable: Secondary | ICD-10-CM

## 2023-02-10 DIAGNOSIS — K219 Gastro-esophageal reflux disease without esophagitis: Secondary | ICD-10-CM | POA: Diagnosis not present

## 2023-02-10 DIAGNOSIS — G629 Polyneuropathy, unspecified: Secondary | ICD-10-CM | POA: Diagnosis not present

## 2023-02-10 DIAGNOSIS — E782 Mixed hyperlipidemia: Secondary | ICD-10-CM | POA: Diagnosis not present

## 2023-02-10 DIAGNOSIS — K449 Diaphragmatic hernia without obstruction or gangrene: Secondary | ICD-10-CM | POA: Diagnosis not present

## 2023-02-10 DIAGNOSIS — R0609 Other forms of dyspnea: Secondary | ICD-10-CM | POA: Diagnosis not present

## 2023-02-10 DIAGNOSIS — I714 Abdominal aortic aneurysm, without rupture, unspecified: Secondary | ICD-10-CM | POA: Diagnosis not present

## 2023-02-10 DIAGNOSIS — I10 Essential (primary) hypertension: Secondary | ICD-10-CM | POA: Diagnosis not present

## 2023-02-10 DIAGNOSIS — R600 Localized edema: Secondary | ICD-10-CM | POA: Diagnosis not present

## 2023-03-04 ENCOUNTER — Ambulatory Visit: Payer: Medicare PPO | Admitting: Family Medicine

## 2023-03-04 ENCOUNTER — Encounter: Payer: Self-pay | Admitting: Family Medicine

## 2023-03-04 VITALS — BP 174/70 | HR 94 | Temp 97.5°F | Resp 16 | Ht <= 58 in | Wt 153.1 lb

## 2023-03-04 DIAGNOSIS — I872 Venous insufficiency (chronic) (peripheral): Secondary | ICD-10-CM | POA: Diagnosis not present

## 2023-03-04 DIAGNOSIS — R600 Localized edema: Secondary | ICD-10-CM | POA: Diagnosis not present

## 2023-03-04 DIAGNOSIS — I890 Lymphedema, not elsewhere classified: Secondary | ICD-10-CM | POA: Diagnosis not present

## 2023-03-04 MED ORDER — FUROSEMIDE 20 MG PO TABS: 20.0000 mg | ORAL_TABLET | Freq: Every day | ORAL | 0 refills | Status: DC | PRN

## 2023-03-04 NOTE — Patient Instructions (Signed)
I would take the lasix pill tomorrow morning and one on Saturday morning.  It will make you pee a lot more than normal.  Wear your compression socks and try to elevate your legs and also get up and walk around some (avoid just sitting for hours)  We will see you Monday to recheck your swelling and skin.

## 2023-03-04 NOTE — Progress Notes (Signed)
Patient ID: Christina Salas, female    DOB: [birth year], 89 y.o.   MRN: [id number]  PCP: Steele Sizer, MD  Chief Complaint  Patient presents with   Blister    Near ankle on right leg. Pt states happened overnight unsure how    Subjective:   Christina Salas is a 89 y.o. female, presents to clinic with CC of the following:  HPI  Pt presents for LE edema and fluid collection in right lower leg Hx of same managed by cardiology and she also sees dermatology for [facility] She notes worse welling x 2 d from not wearing compression socks  No orthopnea, pnd, DOE out of ordinary for her, no weight gain   Wt Readings from Last 5 Encounters:  03/04/23 153 lb 1.6 oz (69.4 kg)  10/16/22 145 lb (65.8 kg)  05/21/22 153 lb 4.8 oz (69.5 kg)  04/15/22 146 lb (66.2 kg)  02/20/22 148 lb 8 oz (67.4 kg)   BMI Readings from Last 5 Encounters:  03/04/23 33.13 kg/m  10/16/22 31.38 kg/m  05/21/22 33.17 kg/m  04/15/22 27.59 kg/m  02/20/22 32.14 kg/m       Patient Active Problem List   Diagnosis Date Noted   Menopausal osteoporosis 10/16/2022   History of iron deficiency anemia 10/16/2022   AVM (arteriovenous malformation) of small bowel, acquired 10/16/2022   Neuropathy 10/16/2022   Mild protein-calorie malnutrition ([place]) 10/16/2022   Stricture and stenosis of esophagus    Mild depression 01/24/2019   Large hiatal hernia 01/24/2019   Internal hemorrhoids 01/24/2019   Iron deficiency anemia due to chronic blood loss    Venous reflux 04/19/2018   Prediabetes 06/08/2016   Obesity (BMI 30.0-34.9) 06/08/2016   Vitamin D deficiency 06/08/2016   Post-menopausal 12/11/2015   Hyperglycemia 10/21/2015   DD (diverticular disease) 06/19/2015   Edema extremities 06/19/2015   Esophageal reflux 06/19/2015   HLD (hyperlipidemia) 06/19/2015   Varicose veins of leg with swelling, bilateral 06/19/2015   Recurrent UTI 02/08/2013   Mixed incontinence 02/08/2013   History of breast  cancer, left, T1, N0, Left MRM - Dec 1996 03/18/2012   Tension headache 05/24/2007   History of melanoma 08/28/1994      Current Outpatient Medications:    butalbital-acetaminophen-caffeine (FIORICET) 50-325-40 MG tablet, TAKE 1 TABLET BY MOUTH EVERY 6 HOURS AS NEEDED, Disp: 60 tablet, Rfl: 0   Calcium-Magnesium 500-250 MG TABS, Take 1 tablet by mouth 2 (two) times daily., Disp: , Rfl:    CINNAMON PO, Take 1 capsule by mouth daily. 350 mg, Disp: , Rfl:    Cranberry 500 MG CAPS, Take 1 tablet by mouth daily., Disp: , Rfl:    esomeprazole (NEXIUM) 20 MG capsule, Take 1 capsule (20 mg total) by mouth daily., Disp: 90 capsule, Rfl: 1   furosemide (LASIX) 20 MG tablet, Take 1 tablet (20 mg total) by mouth daily as needed for edema or fluid (in am)., Disp: 5 tablet, Rfl: 0   Misc Natural Products (LEG VEIN & CIRCULATION) TABS, Take 2 capsules by mouth daily., Disp: , Rfl:    Multiple Vitamins-Calcium (ESSENTIAL ONE DAILY MULTIVIT) TABS, Take by mouth., Disp: , Rfl:    naproxen (NAPROSYN) 500 MG tablet, TAKE 1 TABLET(500 MG) BY MOUTH TWICE DAILY WITH A MEAL, Disp: 20 tablet, Rfl: 0   Omega-3 Fatty Acids (FISH OIL) 1000 MG CAPS, Take by mouth., Disp: , Rfl:    triamcinolone cream (KENALOG) 0.1 %, Apply topically to lower legs 3-5 days a week  for rash. Avoid applying to face, groin, and axilla. Use as directed., Disp: 80 g, Rfl: 3   FEROSUL 325 (65 Fe) MG tablet, TAKE 1 TABLET BY MOUTH DAILY (Patient not taking: Reported on 03/04/2023), Disp: 100 tablet, Rfl: 1   No Known Allergies   Social History   Tobacco Use   Smoking status: Never   Smokeless tobacco: Never  Vaping Use   Vaping Use: Never used  Substance Use Topics   Alcohol use: No    Alcohol/week: 0.0 standard drinks of alcohol   Drug use: Never      Chart Review Today: I personally reviewed active problem list, medication list, allergies, family history, social history, health maintenance, notes from last encounter, lab results,  imaging with the patient/caregiver today.   Review of Systems  Constitutional: Negative.  Negative for activity change, appetite change and unexpected weight change.  HENT: Negative.    Eyes: Negative.   Respiratory: Negative.  Negative for cough, choking, chest tightness, shortness of breath and wheezing.   Cardiovascular: Negative.  Negative for chest pain and palpitations.  Gastrointestinal: Negative.   Endocrine: Negative.   Genitourinary: Negative.   Musculoskeletal: Negative.   Skin: Negative.   Allergic/Immunologic: Negative.   Neurological: Negative.   Hematological: Negative.   Psychiatric/Behavioral: Negative.    All other systems reviewed and are negative.      Objective:   Vitals:   03/04/23 1514  BP: (!) 174/70  Pulse: 94  Resp: 16  Temp: (!) 97.5 F (36.4 C)  TempSrc: Oral  SpO2: 96%  Weight: 153 lb 1.6 oz (69.4 kg)  Height: '4\' 9"'$  (1.448 m)    Body mass index is 33.13 kg/m.  Physical Exam Vitals and nursing note reviewed.  Constitutional:      General: She is not in acute distress.    Appearance: She is not ill-appearing, toxic-appearing or diaphoretic.  HENT:     Head: Normocephalic and atraumatic.     Right Ear: External ear normal.     Left Ear: External ear normal.     Nose: Nose normal.  Eyes:     General: No scleral icterus.       Right eye: No discharge.        Left eye: No discharge.     Conjunctiva/sclera: Conjunctivae normal.  Cardiovascular:     Rate and Rhythm: Normal rate and regular rhythm.     Pulses: Normal pulses.     Heart sounds: Normal heart sounds.  Pulmonary:     Effort: Pulmonary effort is normal. No respiratory distress.     Breath sounds: No stridor. No wheezing, rhonchi or rales.  Abdominal:     General: Bowel sounds are normal.     Palpations: Abdomen is soft.  Musculoskeletal:     Right lower leg: Edema present.     Left lower leg: Edema present.  Skin:    Findings: Rash present.     Comments: Chronic LE  skin changes - rash, peeling, erythema, hyperpigmentation Pocket of clear fluid collection lower right leg  Neurological:     Mental Status: She is alert.     Motor: Weakness: walks with cane.     Gait: Gait abnormal.  Psychiatric:        Mood and Affect: Mood normal.        Behavior: Behavior normal.              Results for orders placed or performed in visit on 10/16/22  Lipid panel  Result Value Ref Range   Cholesterol 180 <200 mg/dL   HDL 55 > OR = 50 mg/dL   Triglycerides 169 (H) <150 mg/dL   LDL Cholesterol (Calc) 98 mg/dL (calc)   Total CHOL/HDL Ratio 3.3 <5.0 (calc)   Non-HDL Cholesterol (Calc) 125 <130 mg/dL (calc)  COMPLETE METABOLIC PANEL WITH GFR  Result Value Ref Range   Glucose, Bld 95 65 - 99 mg/dL   BUN 31 (H) 7 - 25 mg/dL   Creat 0.91 0.60 - 0.95 mg/dL   eGFR 61 > OR = 60 mL/min/1.40m   BUN/Creatinine Ratio 34 (H) 6 - 22 (calc)   Sodium 143 135 - 146 mmol/L   Potassium 4.1 3.5 - 5.3 mmol/L   Chloride 107 98 - 110 mmol/L   CO2 27 20 - 32 mmol/L   Calcium 9.8 8.6 - 10.4 mg/dL   Total Protein 7.1 6.1 - 8.1 g/dL   Albumin 4.3 3.6 - 5.1 g/dL   Globulin 2.8 1.9 - 3.7 g/dL (calc)   AG Ratio 1.5 1.0 - 2.5 (calc)   Total Bilirubin 0.3 0.2 - 1.2 mg/dL   Alkaline phosphatase (APISO) 76 37 - 153 U/L   AST 17 10 - 35 U/L   ALT 11 6 - 29 U/L  CBC with Differential/Platelet  Result Value Ref Range   WBC 6.7 3.8 - 10.8 Thousand/uL   RBC 4.30 3.80 - 5.10 Million/uL   Hemoglobin 12.8 11.7 - 15.5 g/dL   HCT 38.0 35.0 - 45.0 %   MCV 88.4 80.0 - 100.0 fL   MCH 29.8 27.0 - 33.0 pg   MCHC 33.7 32.0 - 36.0 g/dL   RDW 13.5 11.0 - 15.0 %   Platelets 160 140 - 400 Thousand/uL   MPV 10.8 7.5 - 12.5 fL   Neutro Abs 3,678 1,500 - 7,800 cells/uL   Lymphs Abs 2,513 850 - 3,900 cells/uL   Absolute Monocytes 382 200 - 950 cells/uL   Eosinophils Absolute 107 15 - 500 cells/uL   Basophils Absolute 20 0 - 200 cells/uL   Neutrophils Relative % 54.9 %   Total Lymphocyte  37.5 %   Monocytes Relative 5.7 %   Eosinophils Relative 1.6 %   Basophils Relative 0.3 %  Iron, TIBC and Ferritin Panel  Result Value Ref Range   Iron 65 45 - 160 mcg/dL   TIBC 349 250 - 450 mcg/dL (calc)   %SAT 19 16 - 45 % (calc)   Ferritin 36 16 - 288 ng/mL  VITAMIN D 25 Hydroxy (Vit-D Deficiency, Fractures)  Result Value Ref Range   Vit D, 25-Hydroxy 40 30 - 100 ng/mL       Assessment & Plan:   1. Leg edema Pt presents with 2 days of acute on chronic LE edema which she states has happened cause of not wearing compression socks x 2 d She denies any orthpnea, pnd, palpitations, weight gain, DOE, CP BP is hgih today but she states it always goes down when rechecked - cma to recheck at end of visit but its not documented. She does see cardiology - kJefm [facility]- [place] recommends compression socks, low salt, elevate legs and considering starting diuretics-but so far the patient has not needed diuretics She was prescribed Lasix today encouraged her to take a dose tomorrow morning and perhaps another dose Saturday morning while wearing compression stockings throughout the day, elevating legs when able, avoiding long periods of sitting.   I will recheck her in a few days She was given  nonadherent gauze and Kerlix to place over the right lower leg -she may need wound care/vascular, encouraged to keep the area clean and covered to avoid infection  2. Stasis dermatitis of both legs Appears chronic - she does see dermatology and has previously seen vascular specialists She may need to again see vascular for reassessment  She states the swelling is only slightly worse than normal, redness is not new, the fluid pocket is new and we there this morning upon waking.    Close follow-up Monday morning for recheck   BP was elevated - pt was instructed to wait for her AVS and BP recheck but she left before CMA could recheck    Delsa Grana, PA-C 03/04/23 4:36 PM

## 2023-03-08 ENCOUNTER — Ambulatory Visit: Payer: Medicare PPO | Admitting: Family Medicine

## 2023-03-08 ENCOUNTER — Encounter: Payer: Self-pay | Admitting: Family Medicine

## 2023-03-08 VITALS — BP 152/70 | HR 91 | Temp 97.6°F | Resp 16 | Ht <= 58 in | Wt 150.8 lb

## 2023-03-08 DIAGNOSIS — R600 Localized edema: Secondary | ICD-10-CM | POA: Diagnosis not present

## 2023-03-08 DIAGNOSIS — I872 Venous insufficiency (chronic) (peripheral): Secondary | ICD-10-CM | POA: Diagnosis not present

## 2023-03-08 DIAGNOSIS — S81801A Unspecified open wound, right lower leg, initial encounter: Secondary | ICD-10-CM | POA: Diagnosis not present

## 2023-03-08 MED ORDER — POTASSIUM CHLORIDE CRYS ER 20 MEQ PO TBCR: 20.0000 meq | EXTENDED_RELEASE_TABLET | Freq: Every day | ORAL | 0 refills | Status: DC | PRN

## 2023-03-08 MED ORDER — FUROSEMIDE 20 MG PO TABS: 20.0000 mg | ORAL_TABLET | Freq: Every day | ORAL | 0 refills | Status: DC | PRN

## 2023-03-08 NOTE — Progress Notes (Signed)
Patient ID: Christina Salas, female    DOB: [birth year], 89 y.o.   MRN: [id number]  PCP: Steele Sizer, MD  Chief Complaint  Patient presents with   Follow-up   Edema    Still the same, has to drain it our once it gets full    Subjective:   Christina Salas is a 89 y.o. female, presents to clinic with CC of the following:  HPI  F/up to recheck LE edema and drainage  She took 2 doses of lasix over the weekend, she did not put on compression socks except for one day, but did keep her legs elevated  Edema has improved a little She continues to have serous fluid drainage from right lower leg and it has a little burning now, no purulence, spreading redness, fever She tolerated lasix x 2 d well w/o much urinary sx   Patient Active Problem List   Diagnosis Date Noted   Menopausal osteoporosis 10/16/2022   History of iron deficiency anemia 10/16/2022   AVM (arteriovenous malformation) of small bowel, acquired 10/16/2022   Neuropathy 10/16/2022   Mild protein-calorie malnutrition ([place]) 10/16/2022   Stricture and stenosis of esophagus    Mild depression 01/24/2019   Large hiatal hernia 01/24/2019   Internal hemorrhoids 01/24/2019   Iron deficiency anemia due to chronic blood loss    Venous reflux 04/19/2018   Prediabetes 06/08/2016   Obesity (BMI 30.0-34.9) 06/08/2016   Vitamin D deficiency 06/08/2016   Post-menopausal 12/11/2015   Hyperglycemia 10/21/2015   DD (diverticular disease) 06/19/2015   Edema extremities 06/19/2015   Esophageal reflux 06/19/2015   HLD (hyperlipidemia) 06/19/2015   Varicose veins of leg with swelling, bilateral 06/19/2015   Recurrent UTI 02/08/2013   Mixed incontinence 02/08/2013   History of breast cancer, left, T1, N0, Left MRM - Dec 1996 03/18/2012   Tension headache 05/24/2007   History of melanoma 08/28/1994      Current Outpatient Medications:    butalbital-acetaminophen-caffeine (FIORICET) 50-325-40 MG tablet, TAKE 1 TABLET BY  MOUTH EVERY 6 HOURS AS NEEDED, Disp: 60 tablet, Rfl: 0   Calcium-Magnesium 500-250 MG TABS, Take 1 tablet by mouth 2 (two) times daily., Disp: , Rfl:    CINNAMON PO, Take 1 capsule by mouth daily. 350 mg, Disp: , Rfl:    Cranberry 500 MG CAPS, Take 1 tablet by mouth daily., Disp: , Rfl:    esomeprazole (NEXIUM) 20 MG capsule, Take 1 capsule (20 mg total) by mouth daily., Disp: 90 capsule, Rfl: 1   Misc Natural Products (LEG VEIN & CIRCULATION) TABS, Take 2 capsules by mouth daily., Disp: , Rfl:    Multiple Vitamins-Calcium (ESSENTIAL ONE DAILY MULTIVIT) TABS, Take by mouth., Disp: , Rfl:    naproxen (NAPROSYN) 500 MG tablet, TAKE 1 TABLET(500 MG) BY MOUTH TWICE DAILY WITH A MEAL, Disp: 20 tablet, Rfl: 0   Omega-3 Fatty Acids (FISH OIL) 1000 MG CAPS, Take by mouth., Disp: , Rfl:    potassium chloride SA (KLOR-CON M) 20 MEQ tablet, Take 1 tablet (20 mEq total) by mouth daily as needed (take supplement when taking lasix/diuretic)., Disp: 15 tablet, Rfl: 0   triamcinolone cream (KENALOG) 0.1 %, Apply topically to lower legs 3-5 days a week for rash. Avoid applying to face, groin, and axilla. Use as directed., Disp: 80 g, Rfl: 3   FEROSUL 325 (65 Fe) MG tablet, TAKE 1 TABLET BY MOUTH DAILY (Patient not taking: Reported on 03/04/2023), Disp: 100 tablet, Rfl: 1   furosemide (LASIX)  20 MG tablet, Take 1 tablet (20 mg total) by mouth daily as needed for edema or fluid (in am take with potassium supplement)., Disp: 14 tablet, Rfl: 0   No Known Allergies   Social History   Tobacco Use   Smoking status: Never   Smokeless tobacco: Never  Vaping Use   Vaping Use: Never used  Substance Use Topics   Alcohol use: No    Alcohol/week: 0.0 standard drinks of alcohol   Drug use: Never      Chart Review Today: I personally reviewed active problem list, medication list, allergies, family history, social history, health maintenance, notes from last encounter, lab results, imaging with the patient/caregiver  today.   Review of Systems  Constitutional: Negative.   HENT: Negative.    Eyes: Negative.   Respiratory: Negative.    Cardiovascular: Negative.   Gastrointestinal: Negative.   Endocrine: Negative.   Genitourinary: Negative.   Musculoskeletal: Negative.   Skin: Negative.   Allergic/Immunologic: Negative.   Neurological: Negative.   Hematological: Negative.   Psychiatric/Behavioral: Negative.    All other systems reviewed and are negative.      Objective:   Vitals:   03/08/23 1336 03/08/23 1446  BP: (!) 168/76 (!) 152/70  Pulse: 91   Resp: 16   Temp: 97.6 F (36.4 C)   TempSrc: Oral   SpO2: 93%   Weight: 150 lb 12.8 oz (68.4 kg)   Height: '4\' 9"'$  (1.448 m)     Body mass index is 32.63 kg/m.  Physical Exam Vitals and nursing note reviewed.  Constitutional:      General: She is not in acute distress.    Appearance: She is well-developed. She is not ill-appearing, toxic-appearing or diaphoretic.  HENT:     Head: Normocephalic and atraumatic.     Right Ear: External ear normal.     Left Ear: External ear normal.     Nose: Nose normal.  Eyes:     General: No scleral icterus.       Right eye: No discharge.        Left eye: No discharge.     Conjunctiva/sclera: Conjunctivae normal.  Neck:     Trachea: No tracheal deviation.  Cardiovascular:     Rate and Rhythm: Normal rate and regular rhythm.     Pulses: Normal pulses.     Heart sounds: Normal heart sounds.  Pulmonary:     Effort: Pulmonary effort is normal. No respiratory distress.     Breath sounds: No stridor. No wheezing, rhonchi or rales.  Abdominal:     General: Bowel sounds are normal.     Palpations: Abdomen is soft.  Musculoskeletal:     Right lower leg: Edema present.     Left lower leg: Edema present.  Skin:    General: Skin is warm and dry.     Findings: Erythema (improving compared to last week on left leg, right blister new erythema), lesion (continued ~6cm diameter round to oval blister  right lower outer leg, with continues serous drainage) and rash (scaley dry skin scattered to b/l le, not as bad as last week, some flakes peeling off in clinic) present.     Comments: Chronic LE skin changes - rash, peeling, erythema, hyperpigmentation Pocket of clear fluid collection lower right leg  Neurological:     Mental Status: She is alert.     Motor: No abnormal muscle tone.     Coordination: Coordination normal.     Gait: Gait abnormal (walks  with cane - baseline).  Psychiatric:        Mood and Affect: Mood normal.        Behavior: Behavior normal.     Today:  Left LE   Today right LE:       Friday 03/04/2023:  next two photos        Assessment & Plan:   1. Leg edema Will do a few more days of lasix with potassium supplement and this time encouraged to use compression stockings WITH lasix to get more fluid off Overall edema is improving and less than Friday but continued right LE drainage and new erythema around blister area - will try and get in with wound care/vascular ASAP - may need unna boots  - furosemide (LASIX) 20 MG tablet; Take 1 tablet (20 mg total) by mouth daily as needed for edema or fluid (in am take with potassium supplement).  Dispense: 14 tablet; Refill: 0 - Ambulatory referral to Wound Clinic - potassium chloride SA (KLOR-CON M) 20 MEQ tablet; Take 1 tablet (20 mEq total) by mouth daily as needed (take supplement when taking lasix/diuretic).  Dispense: 15 tablet; Refill: 0 - Ambulatory referral to Vascular Surgery  2. Stasis dermatitis of both legs Chronic skin changes, edema and erythema overall look a little better than Friday - Ambulatory referral to [facility] - Ambulatory referral to Vascular Surgery  3. Wound of right lower extremity, initial encounter Right LE outer leg continued serous drainage with new erythematous base to blistered area - no purulence or tenderness right now - but concern for development of infection - urgent  consults to wound and vascular  Pt will need to f/up with [place] in office Friday if she is not in with one of them this week  - Ambulatory referral to [facility] - Ambulatory referral to Vascular Surgery  Pt again encouraged to cover blister draining area with nonadherent gauze - a few layers, and light paper tape to secure to leg without bulk so she can put compression stocking over and wear throughout the day, remove stockings at night  Elevated BP again in clinic - pending recheck at end of visit Remained a little elevated - not currently on meds, will need to recheck either home BP when at rest, or recheck in office when not having so many acute issues and see if she needs to start HTN meds    Delsa Grana, PA-C 03/08/23 2:14 PM

## 2023-03-09 ENCOUNTER — Ambulatory Visit: Payer: Medicare PPO | Admitting: Dermatology

## 2023-03-09 ENCOUNTER — Other Ambulatory Visit (INDEPENDENT_AMBULATORY_CARE_PROVIDER_SITE_OTHER): Payer: Self-pay | Admitting: Nurse Practitioner

## 2023-03-09 VITALS — BP 152/89 | HR 74

## 2023-03-09 DIAGNOSIS — R238 Other skin changes: Secondary | ICD-10-CM

## 2023-03-09 DIAGNOSIS — S81801A Unspecified open wound, right lower leg, initial encounter: Secondary | ICD-10-CM

## 2023-03-09 DIAGNOSIS — I872 Venous insufficiency (chronic) (peripheral): Secondary | ICD-10-CM

## 2023-03-09 DIAGNOSIS — R600 Localized edema: Secondary | ICD-10-CM

## 2023-03-09 MED ORDER — MUPIROCIN 2 % EX OINT: 1.0000 | TOPICAL_OINTMENT | Freq: Every day | CUTANEOUS | 0 refills | Status: DC

## 2023-03-09 NOTE — Patient Instructions (Addendum)
Due to recent changes in healthcare laws, you may see results of your pathology and/or laboratory studies on MyChart before the doctors have had a chance to review them. We understand that in some cases there may be results that are confusing or concerning to you. Please understand that not all results are received at the same time and often the doctors may need to interpret multiple results in order to provide you with the best plan of care or course of treatment. Therefore, we ask that you please give [place] 2 business days to thoroughly review all your results before contacting the office for clarification. Should we see a critical lab result, you will be contacted sooner.   If You Need Anything After Your Visit  If you have any questions or concerns for your doctor, please call our main line at [phone] and press option 4 to reach your doctor's medical assistant. If no one answers, please leave a voicemail as directed and we will return your call as soon as possible. Messages left after 4 pm will be answered the following business day.   You may also send [place] a message via [place]. We typically respond to MyChart messages within 1-2 business days.  For prescription refills, please ask your pharmacy to contact our office. Our fax number is [phone].  If you have an urgent issue when the clinic is closed that cannot wait until the next business day, you can page your doctor at the number below.    Please note that while we do our best to be available for urgent issues outside of office hours, we are not available 24/7.   If you have an urgent issue and are unable to reach [place], you may choose to seek medical care at your doctor's office, retail clinic, urgent care center, or emergency room.  If you have a medical emergency, please immediately call 911 or go to the emergency department.  Pager Numbers  - Dr. Nehemiah Massed: [phone]  - Dr. Laurence Ferrari: [phone]  - Dr. Nicole Kindred:  [phone]  In the event of inclement weather, please call our main line at [phone] for an update on the status of any delays or closures.  Dermatology Medication Tips: Please keep the boxes that topical medications come in in order to help keep track of the instructions about where and how to use these. Pharmacies typically print the medication instructions only on the boxes and not directly on the medication tubes.   If your medication is too expensive, please contact our office at [phone] option 4 or send [place] a message through [place].   We are unable to tell what your co-pay for medications will be in advance as this is different depending on your insurance coverage. However, we may be able to find a substitute medication at lower cost or fill out paperwork to get insurance to cover a needed medication.   If a prior authorization is required to get your medication covered by your insurance company, please allow [place] 1-2 business days to complete this process.  Drug prices often vary depending on where the prescription is filled and some pharmacies may offer cheaper prices.  The website www.goodrx.com contains coupons for medications through different pharmacies. The prices here do not account for what the cost may be with help from insurance (it may be cheaper with your insurance), but the website can give you the price if you did not use any insurance.  - You can print the associated coupon and take it with  your prescription to the pharmacy.  - You may also stop by our office during regular business hours and pick up a GoodRx coupon card.  - If you need your prescription sent electronically to a different pharmacy, notify our office through [facility] or by phone at [phone] option 4.     Si Usted Necesita Algo Despus de Su Visita  Tambin puede enviarnos un mensaje a travs de Pharmacist, community. Por lo general respondemos a los mensajes de MyChart en el transcurso de 1 a 2  das hbiles.  Para renovar recetas, por favor pida a su farmacia que se ponga en contacto con nuestra oficina. Harland Dingwall de fax es [place] [phone].  Si tiene un asunto urgente cuando la clnica est cerrada y que no puede esperar hasta el siguiente da hbil, puede llamar/localizar a su doctor(a) al nmero que aparece a continuacin.   Por favor, tenga en cuenta que aunque hacemos todo lo posible para estar disponibles para asuntos urgentes fuera del horario de [place], no estamos disponibles las 24 horas del da, los 7 das de la [place].   Si tiene un problema urgente y no puede comunicarse con nosotros, puede optar por buscar atencin mdica  en el consultorio de su doctor(a), en una clnica privada, en un centro de atencin urgente o en una sala de emergencias.  Si tiene Engineering geologist, por favor llame inmediatamente al 911 o vaya a la sala de emergencias.  Nmeros de bper  - Dr. Nehemiah Massed: [phone]  - Dra. Moye: [phone]  - Dra. Nicole Kindred: [phone]  En caso de inclemencias del [place], por favor llame a Johnsie Kindred principal al [phone] para una actualizacin sobre el [place] de cualquier retraso o cierre.  Consejos para la medicacin en dermatologa: Por favor, guarde las cajas en las que vienen los medicamentos de uso tpico para ayudarle a seguir las instrucciones sobre dnde y cmo usarlos. Las farmacias generalmente imprimen las instrucciones del medicamento slo en las cajas y no directamente en los tubos del [place].   Si su medicamento es muy caro, por favor, pngase en contacto con Zigmund Daniel llamando al [phone] y presione la opcin 4 o envenos un mensaje a travs de Pharmacist, community.   No podemos decirle cul ser su copago por los medicamentos por adelantado ya que esto es diferente dependiendo de la cobertura de su seguro. Sin embargo, es posible que podamos encontrar un medicamento sustituto a Electrical engineer un formulario para que el  seguro cubra el medicamento que se considera necesario.   Si se requiere una autorizacin previa para que su compaa de seguros [place] su medicamento, por favor permtanos de 1 a 2 das hbiles para completar este proceso.  Los precios de los medicamentos varan con frecuencia dependiendo del Environmental consultant de dnde se surte la receta y alguna farmacias pueden ofrecer precios ms baratos.  El sitio web www.goodrx.com tiene cupones para medicamentos de Airline pilot. Los precios aqu no tienen en cuenta lo que podra costar con la ayuda del seguro (puede ser ms barato con su seguro), pero el sitio web puede darle el precio si no utiliz Research scientist (physical sciences).  - Puede imprimir el cupn correspondiente y llevarlo con su receta a la farmacia.  - Tambin puede pasar por nuestra oficina durante el horario de atencin regular y Charity fundraiser una tarjeta de cupones de GoodRx.  - Si necesita que su receta se enve electrnicamente a Chiropodist, informe a nuestra oficina a travs [place]  o por telfono llamando al [phone] y presione la opcin 4. Gentle Skin Care Guide  1. Bathe no more than once a day.  2. Avoid bathing in hot water  3. Use a mild soap like Dove, Vanicream, Cetaphil, CeraVe. Can use Lever 2000 or Cetaphil antibacterial soap  4. Use soap only where you need it. On most days, use it under your arms, between your legs, and on your feet. Let the water rinse other areas unless visibly dirty.  5. When you get out of the bath/shower, use a towel to gently blot your skin dry, don't rub it.  6. While your skin is still a little damp, apply a moisturizing cream such as Vanicream, CeraVe, Cetaphil, Eucerin, Sarna lotion or plain Vaseline Jelly. For hands apply Neutrogena [place] Hand Cream or Excipial Hand Cream.  7. Reapply moisturizer any time you start to itch or feel dry.  8. Sometimes using free and clear laundry detergents can be helpful. Fabric softener sheets  should be avoided. Downy Free & Gentle liquid, or any liquid fabric softener that is free of dyes and perfumes, it acceptable to use  9. If your doctor has given you prescription creams you may apply moisturizers over them

## 2023-03-09 NOTE — Progress Notes (Signed)
   Follow-Up Visit   Subjective  Christina Salas is a 89 y.o. female who presents for the following: Skin Problem (Patient here today for a blister at right lower leg. Present for about 6 days. Patient has been using TMC 0.1% cream and wearing compression most days. Patient has been taking 20 mg of Lasix since Friday and has been seeing PCP who sent in referral to [facility], patient scheduled with them 03/25/23. They have also sent referral to vein & vascular.).  She is not diabetic.  Patient has appointment with vascular tomorrow.   The following portions of the chart were reviewed this encounter and updated as appropriate:       Review of Systems:  No other skin or systemic complaints except as noted in HPI or Assessment and Plan.  Objective  Well appearing patient in no apparent distress; mood and affect are within normal limits.  A focused examination was performed including legs. Relevant physical exam findings are noted in the Assessment and Plan.  Right Lower Leg Bullae at right lower pretibia 7.0 cm with erythematous base, no evidence of infection  Pitting edema of R lower leg at ankle 2-3+    Assessment & Plan  Bullae Right Lower Leg  Likely 2ndary to stasis, no evidence of infection, Chronic and persistent condition with duration or expected duration over one year. Condition is bothersome/symptomatic for patient. Currently flared.   Continue Lasix as prescribed by PCP  Discussed applying Unna boot, but patient scheduled to see vascular tomorrow so will hold off for now. They may want to apply Unna boot there, or she can RTC to have it done here after her appointment.  I&D performed today at dependent edge of bullae Mupirocin/Telfa/gauze wrap/Coban applied, change daily if Unna boot not applied. Pt has wound care referral already in place with appt on 03/25/23  Stasis in the legs causes chronic leg swelling, which may result in itchy or painful rashes, skin  discoloration, skin texture changes, and sometimes ulceration.  Recommend daily graduated compression hose/stockings- easiest to put on first thing in morning, remove at bedtime.  Elevate legs as much as possible. Avoid salt/sodium rich foods.     Incision and Drainage - Right Lower Leg Location: right lower leg  Informed Consent: Discussed risks (permanent scarring, light or dark discoloration, infection, pain, bleeding, bruising, redness, damage to adjacent structures, and recurrence of the lesion) and benefits of the procedure, as well as the alternatives.  Informed consent was obtained.  Preparation: The area was prepped with Puracyn.  Anesthesia: none  Procedure Details: An incision was made overlying the lesion. The lesion drained yellow serous fluid.  A large amount of fluid was drained.    Antibiotic ointment and a sterile pressure dressing were applied. The patient tolerated procedure well.  Total number of lesions drained: 1  Plan: The patient was instructed on post-op care. Recommend OTC analgesia as needed for pain.  mupirocin ointment (BACTROBAN) 2 % - Right Lower Leg Apply 1 Application topically daily.   Return if symptoms worsen or fail to improve.  Graciella Belton, RMA, am acting as scribe for Brendolyn Patty, MD .  Documentation: I have reviewed the above documentation for accuracy and completeness, and I agree with the above.  Brendolyn Patty MD

## 2023-03-10 ENCOUNTER — Encounter (INDEPENDENT_AMBULATORY_CARE_PROVIDER_SITE_OTHER): Payer: Self-pay | Admitting: Nurse Practitioner

## 2023-03-10 ENCOUNTER — Ambulatory Visit (INDEPENDENT_AMBULATORY_CARE_PROVIDER_SITE_OTHER): Payer: Medicare PPO

## 2023-03-10 ENCOUNTER — Ambulatory Visit (INDEPENDENT_AMBULATORY_CARE_PROVIDER_SITE_OTHER): Payer: Medicare PPO | Admitting: Nurse Practitioner

## 2023-03-10 VITALS — BP 163/71 | HR 83 | Resp 18 | Ht 62.0 in | Wt 148.2 lb

## 2023-03-10 DIAGNOSIS — L03115 Cellulitis of right lower limb: Secondary | ICD-10-CM

## 2023-03-10 DIAGNOSIS — R600 Localized edema: Secondary | ICD-10-CM

## 2023-03-10 DIAGNOSIS — I872 Venous insufficiency (chronic) (peripheral): Secondary | ICD-10-CM

## 2023-03-10 DIAGNOSIS — I83893 Varicose veins of bilateral lower extremities with other complications: Secondary | ICD-10-CM

## 2023-03-10 DIAGNOSIS — S81801A Unspecified open wound, right lower leg, initial encounter: Secondary | ICD-10-CM

## 2023-03-10 MED ORDER — SULFAMETHOXAZOLE-TRIMETHOPRIM 800-160 MG PO TABS: 1.0000 | ORAL_TABLET | Freq: Two times a day (BID) | ORAL | 0 refills | Status: DC

## 2023-03-17 ENCOUNTER — Ambulatory Visit (INDEPENDENT_AMBULATORY_CARE_PROVIDER_SITE_OTHER): Payer: Medicare PPO | Admitting: Nurse Practitioner

## 2023-03-17 ENCOUNTER — Encounter (INDEPENDENT_AMBULATORY_CARE_PROVIDER_SITE_OTHER): Payer: Self-pay | Admitting: Nurse Practitioner

## 2023-03-17 VITALS — BP 134/77 | HR 75 | Resp 18

## 2023-03-17 DIAGNOSIS — R600 Localized edema: Secondary | ICD-10-CM | POA: Diagnosis not present

## 2023-03-17 NOTE — Progress Notes (Signed)
History of Present Illness  There is no documented history at this time  Assessments & Plan   There are no diagnoses linked to this encounter.    Additional instructions  Subjective:  Patient presents with venous ulcer of the Bilateral lower extremity.    Procedure:  3 layer unna wrap was placed Bilateral lower extremity.   Plan:   Follow up in one week.  Xeroform on lower lateral right leg.

## 2023-03-18 ENCOUNTER — Telehealth (INDEPENDENT_AMBULATORY_CARE_PROVIDER_SITE_OTHER): Payer: Self-pay | Admitting: Nurse Practitioner

## 2023-03-18 NOTE — Telephone Encounter (Signed)
Patient notified with medical recommendations  

## 2023-03-18 NOTE — Telephone Encounter (Signed)
Patient LVM stating she have an appointment with the wound clinic on 04-06-23 at 1:45 and wanted to know if that was far enough out or if she needed to schedule it out farther.  Please advise.

## 2023-03-18 NOTE — Telephone Encounter (Signed)
Yes, I think that is fine as will see her next week and we can see if appt will still be needed

## 2023-03-24 ENCOUNTER — Ambulatory Visit (INDEPENDENT_AMBULATORY_CARE_PROVIDER_SITE_OTHER): Payer: Medicare PPO | Admitting: Nurse Practitioner

## 2023-03-24 ENCOUNTER — Encounter (INDEPENDENT_AMBULATORY_CARE_PROVIDER_SITE_OTHER): Payer: Medicare PPO

## 2023-03-24 VITALS — BP 134/66 | HR 70 | Resp 15 | Wt 148.2 lb

## 2023-03-24 DIAGNOSIS — L03115 Cellulitis of right lower limb: Secondary | ICD-10-CM

## 2023-03-24 DIAGNOSIS — I890 Lymphedema, not elsewhere classified: Secondary | ICD-10-CM

## 2023-03-25 ENCOUNTER — Ambulatory Visit: Payer: Medicare PPO | Admitting: Physician Assistant

## 2023-03-25 ENCOUNTER — Ambulatory Visit (INDEPENDENT_AMBULATORY_CARE_PROVIDER_SITE_OTHER): Payer: Medicare PPO

## 2023-03-25 VITALS — Ht 62.0 in | Wt 148.0 lb

## 2023-03-25 DIAGNOSIS — Z0000 Encounter for general adult medical examination without abnormal findings: Secondary | ICD-10-CM

## 2023-03-25 NOTE — Progress Notes (Signed)
I connected with  Ardine I Fleites on 03/25/23 by a audio enabled telemedicine application and verified that I am speaking with the correct person using two identifiers.  Patient Location: Home  Provider Location: Office/Clinic  I discussed the limitations of evaluation and management by telemedicine. The patient expressed understanding and agreed to proceed.  Subjective:   Christina Salas is a 89 y.o. female who presents for Medicare Annual (Subsequent) preventive examination.  Review of Systems    Cardiac Risk Factors include: advanced age (>68men, >33 women);dyslipidemia;sedentary lifestyle    Objective:    Today's Vitals   03/25/23 1405  Weight: 148 lb (67.1 kg)  Height: 5\' 2"  (1.575 m)   Body mass index is 27.07 kg/m.     03/25/2023    2:16 PM 11/11/2022    3:21 PM 03/24/2022   10:50 AM 02/18/2021    3:45 PM 01/07/2021    7:26 AM 01/30/2020    3:05 PM 01/24/2019    9:26 AM  Advanced Directives  Does Patient Have a Medical Advance Directive? No No No No No No No  Would patient like information on creating a medical advance directive?  No - Patient declined No - Patient declined No - Patient declined  Yes (MAU/Ambulatory/Procedural Areas - Information given) Yes (MAU/Ambulatory/Procedural Areas - Information given)    Current Medications (verified) Outpatient Encounter Medications as of 03/25/2023  Medication Sig   butalbital-acetaminophen-caffeine (FIORICET) 50-325-40 MG tablet TAKE 1 TABLET BY MOUTH EVERY 6 HOURS AS NEEDED   Calcium-Magnesium 500-250 MG TABS Take 1 tablet by mouth 2 (two) times daily.   CINNAMON PO Take 1 capsule by mouth daily. 350 mg   Cranberry 500 MG CAPS Take 1 tablet by mouth daily.   esomeprazole (NEXIUM) 20 MG capsule Take 1 capsule (20 mg total) by mouth daily.   FEROSUL 325 (65 Fe) MG tablet TAKE 1 TABLET BY MOUTH DAILY (Patient not taking: Reported on 03/25/2023)   furosemide (LASIX) 20 MG tablet Take 1 tablet (20 mg total) by mouth daily  as needed for edema or fluid (in am take with potassium supplement).   Misc Natural Products (LEG VEIN & CIRCULATION) TABS Take 2 capsules by mouth daily.   Multiple Vitamins-Calcium (ESSENTIAL ONE DAILY MULTIVIT) TABS Take by mouth.   mupirocin ointment (BACTROBAN) 2 % Apply 1 Application topically daily.   naproxen (NAPROSYN) 500 MG tablet TAKE 1 TABLET(500 MG) BY MOUTH TWICE DAILY WITH A MEAL   Omega-3 Fatty Acids (FISH OIL) 1000 MG CAPS Take by mouth.   potassium chloride SA (KLOR-CON M) 20 MEQ tablet Take 1 tablet (20 mEq total) by mouth daily as needed (take supplement when taking lasix/diuretic).   sulfamethoxazole-trimethoprim (BACTRIM DS) 800-160 MG tablet Take 1 tablet by mouth 2 (two) times daily.   triamcinolone cream (KENALOG) 0.1 % Apply topically to lower legs 3-5 days a week for rash. Avoid applying to face, groin, and axilla. Use as directed.   No facility-administered encounter medications on file as of 03/25/2023.    Allergies (verified) Patient has no known allergies.   History: Past Medical History:  Diagnosis Date   Abnormal glucose    Anxiety    Arthritis    Breast cancer ([place]) 1996   Left- mastectomy   Cancer ([place])    melanoma   Chronic headaches    Depression    Dysplastic nevus 05/11/2007   right upper gastric area. Slight to moderate atypia.   Dysplastic nevus 06/24/2020   R lateral deltoid, moderate to  severe atypia. Excised 08/13/2020, margins free.   GERD (gastroesophageal reflux disease)    H/O melanoma excision    History of diverticulitis    History of recurrent UTIs    Hypertension    Melanoma ([place])    scalp   Osteoporosis    pre-osetoporosis per patient history form 03/18/12   Pre-diabetes    Vitamin D deficiency    Past Surgical History:  Procedure Laterality Date   ABDOMINAL HYSTERECTOMY  2001   BREAST BIOPSY  2010   BREAST EXCISIONAL BIOPSY Right 1997   Benign excisional biopsy    CATARACT EXTRACTION Left 01/30/2021   Dr. Luberta Mutter   COLONOSCOPY  04/02/2014   COLONOSCOPY WITH PROPOFOL N/A 12/27/2018   Procedure: COLONOSCOPY WITH PROPOFOL;  Surgeon: Lucilla Lame, MD;  Location[facility] ENDOSCOPY;  Service: Endoscopy;  Laterality: N/A;   CYSTOSCOPY  11/2014   ESOPHAGOGASTRODUODENOSCOPY (EGD) WITH PROPOFOL N/A 12/27/2018   Procedure: ESOPHAGOGASTRODUODENOSCOPY (EGD) WITH PROPOFOL;  Surgeon: Lucilla Lame, MD;  Location: [facility] ENDOSCOPY;  Service: Endoscopy;  Laterality: N/A;   ESOPHAGOGASTRODUODENOSCOPY (EGD) WITH PROPOFOL N/A 01/07/2021   Procedure: ESOPHAGOGASTRODUODENOSCOPY (EGD) WITH PROPOFOL;  Surgeon: Lucilla Lame, MD;  Location: [facility] ENDOSCOPY;  Service: Endoscopy;  Laterality: N/A;   GIVENS CAPSULE STUDY N/A 01/07/2021   Procedure: GIVENS CAPSULE STUDY;  Surgeon: Lucilla Lame, MD;  Location: [facility] ENDOSCOPY;  Service: Endoscopy;  Laterality: N/A;   MASTECTOMY  1996   Left   MELANOMA EXCISION  1995   Head   Family History  Problem Relation Age of Onset   Breast cancer Mother    Migraines Mother    Diabetes Father    CAD Father    Thrombosis Father    Aortic aneurysm Sister    Biliary Cirrhosis Brother    Breast cancer Other        Niece   Social History   Socioeconomic History   Marital status: Single    Spouse name: Not on file   Number of children: 0   Years of education: Not on file   Highest education level: Master's degree (e.g., MA, MS, MEng, MEd, MSW, MBA)  Occupational History   Occupation: Retired  Tobacco Use   Smoking status: Never   Smokeless tobacco: Never  Vaping Use   Vaping Use: Never used  Substance and Sexual Activity   Alcohol use: No    Alcohol/week: 0.0 standard drinks of alcohol   Drug use: Never   Sexual activity: Not Currently  Other Topics Concern   Not on file  Social History Narrative   She lives by herself   She has nieces and nephews in the area   She was a Pharmacist, hospital for over 2 years, retired in [place]  Strain: [place]  (03/25/2023)   Overall Financial Resource Strain (CARDIA)    Difficulty of Paying Living Expenses: Not hard at all  Food Insecurity: No Food Insecurity (03/25/2023)   Hunger Vital Sign    Worried About Running Out of Food in the Last Year: Never true    [place] in the Last Year: Never true  Transportation Needs: No Transportation Needs (03/25/2023)   PRAPARE - Hydrologist (Medical): No    Lack of Transportation (Non-Medical): No  Physical Activity: Inactive (03/25/2023)   Exercise Vital Sign    Days of Exercise per Week: 0 days    Minutes of Exercise per Session: 0 min  Stress: No Stress Concern Present (03/25/2023)   [place]    Feeling of Stress : Not at all  Social Connections: Moderately Isolated (03/25/2023)   Social Connection and Isolation Panel [NHANES]    Frequency of Communication with Friends and Family: More than three times a week    Frequency of Social Gatherings with Friends and Family: Never    Attends Religious Services: 1 to 4 times per year    Active Member of [employer] or Organizations: No    Attends Music therapist: Never    Marital Status: Never married    Tobacco Counseling Counseling given: Not Answered   Clinical Intake:  Pre-visit preparation completed: Yes  Pain : No/denies pain     BMI - recorded: 27.07 Nutritional Status: BMI 25 -29 Overweight Nutritional Risks: None Diabetes: No  How often do you need to have someone help you when you read instructions, pamphlets, or other written materials from your doctor or pharmacy?: 1 - Never  Diabetic?no  Interpreter Needed?: No  Comments: lives alone Information entered by :: B.Gabriel Conry,LPN   Activities of Daily Living    03/25/2023    2:17 PM 03/08/2023    1:36 PM  In your present state of health, do you have any difficulty performing the following activities:   Hearing? 0 0  Vision? 0 0  Difficulty concentrating or making decisions? 0 0  Walking or climbing stairs? 1 0  Dressing or bathing? 0 0  Doing errands, shopping? 0 0  Preparing Food and eating ? N   Using the Toilet? N   Managing your Medications? N   Managing your Finances? N   Housekeeping or managing your Housekeeping? Y   Comment wants to find someone to clean her house     Patient Care Team: Steele Sizer, MD as PCP - General (Family Medicine) Lucky Cowboy, Erskine Squibb, MD as Consulting Physician (Vascular Surgery) Yolonda Kida, MD as Consulting Physician (Cardiology) Luberta Mutter, MD as Consulting Physician (Ophthalmology) Ralene Bathe, MD as Consulting Physician (Dermatology) Caroline More, DPM as Consulting Physician (Podiatry) Lucilla Lame, MD as Consulting Physician (Gastroenterology) Festus Aloe, MD as Consulting Physician (Urology)  Indicate any recent Medical Services you may have received from other than [facility] providers in the past year (date may be approximate).     Assessment:   This is a routine wellness examination for Ref[place]io.  Hearing/Vision screen Hearing Screening - Comments:: Inadequate hearing:hearing aides not doing well Vision Screening - Comments:: Adequate vision Dr Ginnie Smart  Dietary issues and exercise activities discussed: Current Exercise Habits: The patient does not participate in regular exercise at present   Goals Addressed             This Visit's Progress    DIET - INCREASE WATER INTAKE   On track    Recommend drinking 6-8 glasses of water per day      Patient Stated   On track    Pt has goals for herself to complete projects at home and organize pictures and family history information.        Depression Screen    03/25/2023    2:14 PM 03/08/2023    1:36 PM 03/04/2023    3:14 PM 10/16/2022    1:07 PM 05/21/2022    1:57 PM 04/15/2022    1:56 PM 03/24/2022   10:48 AM  PHQ 2/9 Scores  PHQ - 2 Score 0 0 0  0 0 1  2  PHQ- 9 Score 0 0 0 6 0 5 4    Fall Risk    03/25/2023    2:10 PM 03/08/2023    1:35 PM 03/04/2023    3:14 PM 10/16/2022    1:06 PM 05/21/2022    1:52 PM  Fall Risk   Falls in the past year? 0 0 0 1 1  Number falls in past yr: 0 0 0 0 0  Injury with Fall? 0 0 0 1 0  Risk for fall due to : No Fall Risks No Fall Risks No Fall Risks Impaired mobility;Impaired balance/gait Impaired balance/gait;Impaired mobility  Follow up Education provided;Falls prevention discussed Falls prevention discussed;Education provided;Falls evaluation completed Falls prevention discussed;Education provided;Falls evaluation completed Falls prevention discussed     FALL RISK PREVENTION PERTAINING TO THE HOME:  Any stairs in or around the home? Yes  If so, are there any without handrails? Yes  Home free of loose throw rugs in walkways, pet beds, electrical cords, etc? Yes  Adequate lighting in your home to reduce risk of falls? Yes   ASSISTIVE DEVICES UTILIZED TO PREVENT FALLS:  Life alert? No needs Use of a cane, [place] or w/c? Yes ane Grab bars in the bathroom? Yes  Shower chair or bench in shower? No does not use shower anymore Elevated toilet seat or a handicapped toilet? Yes   Cognitive Function:        03/25/2023    2:22 PM 01/30/2020    3:09 PM 01/24/2019    9:34 AM  6CIT Screen  What Year? 0 points 0 points 0 points  What month? 0 points 0 points 0 points  What time? 0 points 0 points 0 points  Count back from 20 0 points 0 points 0 points  Months in reverse 0 points 0 points 0 points  Repeat phrase 4 points 4 points 2 points  Total Score 4 points 4 points 2 points    Immunizations Immunization History  Administered Date(s) Administered   Fluad Quad(high Dose 65+) 08/18/2019, 10/15/2020, 10/15/2021, 10/16/2022   Influenza Split 09/10/2007, 11/13/2008, 09/27/2009, 10/17/2010   Influenza, High Dose Seasonal PF 10/21/2015, 09/28/2016, 10/25/2017, 10/19/2018   Influenza, Seasonal,  Injecte, Preservative Fre 10/30/2011, 10/21/2012   Influenza,inj,Quad PF,6+ Mos 09/06/2013, 10/19/2014   PFIZER(Purple Top)SARS-COV-2 Vaccination 01/23/2020, 02/13/2020, 10/28/2020   Pfizer Covid-19 Vaccine Bivalent Booster 82yrs & up 06/10/2021   Pneumococcal Conjugate-13 12/03/2014   Pneumococcal Polysaccharide-23 06/15/2012   Tdap 12/03/2010    TDAP status: Up to date  Flu Vaccine status: Up to date  Pneumococcal vaccine status: Up to date  Covid-19 vaccine status: Completed vaccines  Qualifies for Shingles Vaccine? Yes   Zostavax completed No   Shingrix Completed?: No.    Education has been provided regarding the importance of this vaccine. Patient has been advised to call insurance company to determine out of pocket expense if they have not yet received this vaccine. Advised may also receive vaccine at local pharmacy or Health Dept. Verbalized acceptance and understanding.  Screening Tests Health Maintenance  Topic Date Due   DTaP/Tdap/Td (2 - Td or Tdap) 12/03/2020   COVID-19 Vaccine (5 - 2023-24 season) 08/28/2022   Zoster Vaccines- Shingrix (1 of 2) 06/08/2023 (Originally 11/08/1952)   MAMMOGRAM  09/11/2023   Medicare Annual Wellness (AWV)  03/24/2024   Pneumonia Vaccine 31+ Years old  Completed   INFLUENZA VACCINE  Completed   DEXA SCAN  Completed   HPV VACCINES  Aged Out    Health Maintenance  Health Maintenance Due  Topic Date Due   DTaP/Tdap/Td (2 - Td or Tdap) 12/03/2020   COVID-19 Vaccine (5 - 2023-24 season) 08/28/2022    Colorectal cancer screening: No longer required.   Mammogram status: No longer required due to age.  Bone Density status: Completed yes. Results reflect: Bone density results: OSTEOPOROSIS. Repeat every 5 years.  Lung Cancer Screening: (Low Dose CT Chest recommended if Age 6-80 years, 30 pack-year currently smoking OR have quit w/in 15years.) does not qualify.   Lung Cancer Screening Referral: no  Additional  Screening:  Hepatitis C Screening: does not qualify; Completed yes  Vision Screening: Recommended annual ophthalmology exams for early detection of glaucoma and other disorders of the eye. Is the patient up to date with their annual eye exam?  Yes  Who is the provider or what is the name of the office in which the patient attends annual eye exams? Dr Ellie Lunch If pt is not established with a provider, would they like to be referred to a provider to establish care? No .   Dental Screening: Recommended annual dental exams for proper oral hygiene  Community Resource Referral / Chronic Care Management: CRR required this visit?  No   CCM required this visit?  No      Plan:     I have personally reviewed and noted the following in the patient's chart:   Medical and social history Use of alcohol, tobacco or illicit drugs  Current medications and supplements including opioid prescriptions. Patient is not currently taking opioid prescriptions. Functional ability and status Nutritional status Physical activity Advanced directives List of other physicians Hospitalizations, surgeries, and ER visits in previous 12 months Vitals Screenings to include cognitive, depression, and falls Referrals and appointments  In addition, I have reviewed and discussed with patient certain preventive protocols, quality metrics, and best practice recommendations. A written personalized care plan for preventive services as well as general preventive health recommendations were provided to patient.     Roger Shelter, LPN   [id number]   Nurse Notes: The patient states she is doing well and has no concerns or questions at this time. She relays her legs were unwrapped on yesterday by Vein Specialist and she was released for 3 months. Pt expressed her excitement about this. Pt has several questions and issues for PCP visit next month: *Continuing Iron pills (she says she has been off of them one  year. *vaccines (wants to discuss before receiving, namely Shingrix) *Wants life alert necklace *Renew of handicapp placcard

## 2023-03-25 NOTE — Patient Instructions (Signed)
Christina Salas , Thank you for taking time to come for your Medicare Wellness Visit. I appreciate your ongoing commitment to your health goals. Please review the following plan we discussed and let me know if I can assist you in the future.   These are the goals we discussed:  Goals      DIET - INCREASE WATER INTAKE     Recommend drinking 6-8 glasses of water per day      Patient Stated     Pt has goals for herself to complete projects at home and organize pictures and family history information.         This is a list of the screening recommended for you and due dates:  Health Maintenance  Topic Date Due   DTaP/Tdap/Td vaccine (2 - Td or Tdap) 12/03/2020   COVID-19 Vaccine (5 - 2023-24 season) 08/28/2022   Zoster (Shingles) Vaccine (1 of 2) 06/08/2023*   Mammogram  09/11/2023   Medicare Annual Wellness Visit  03/24/2024   Pneumonia Vaccine  Completed   Flu Shot  Completed   DEXA scan (bone density measurement)  Completed   HPV Vaccine  Aged Out  *Topic was postponed. The date shown is not the original due date.    Advanced directives: no  Conditions/risks identified: low falls risk  Next appointment: Follow up in one year for your annual wellness visit 03/30/2024 @1 :30pm telephone   Preventive Care 65 Years and Older, Female Preventive care refers to lifestyle choices and visits with your health care provider that can promote health and wellness. What does preventive care include? A yearly physical exam. This is also called an annual well check. Dental exams once or twice a year. Routine eye exams. Ask your health care provider how often you should have your eyes checked. Personal lifestyle choices, including: Daily care of your teeth and gums. Regular physical activity. Eating a healthy diet. Avoiding tobacco and drug use. Limiting alcohol use. Practicing safe sex. Taking low-dose aspirin every day. Taking vitamin and mineral supplements as recommended by your health  care provider. What happens during an annual well check? The services and screenings done by your health care provider during your annual well check will depend on your age, overall health, lifestyle risk factors, and family history of disease. Counseling  Your health care provider may ask you questions about your: Alcohol use. Tobacco use. Drug use. Emotional well-being. Home and relationship well-being. Sexual activity. Eating habits. History of falls. Memory and ability to understand (cognition). Work and work Statistician. Reproductive health. Screening  You may have the following tests or measurements: Height, weight, and BMI. Blood pressure. Lipid and cholesterol levels. These may be checked every 5 years, or more frequently if you are over 30 years old. Skin check. Lung cancer screening. You may have this screening every year starting at age 64 if you have a 30-pack-year history of smoking and currently smoke or have quit within the past 15 years. Fecal occult blood test (FOBT) of the stool. You may have this test every year starting at age 15. Flexible sigmoidoscopy or colonoscopy. You may have a sigmoidoscopy every 5 years or a colonoscopy every 10 years starting at age 39. Hepatitis C blood test. Hepatitis B blood test. Sexually transmitted disease (STD) testing. Diabetes screening. This is done by checking your blood sugar (glucose) after you have not eaten for a while (fasting). You may have this done every 1-3 years. Bone density scan. This is done to screen for osteoporosis.  You may have this done starting at age 5. Mammogram. This may be done every 1-2 years. Talk to your health care provider about how often you should have regular mammograms. Talk with your health care provider about your test results, treatment options, and if necessary, the need for more tests. Vaccines  Your health care provider may recommend certain vaccines, such as: Influenza vaccine. This is  recommended every year. Tetanus, diphtheria, and acellular pertussis (Tdap, Td) vaccine. You may need a Td booster every 10 years. Zoster vaccine. You may need this after age 59. Pneumococcal 13-valent conjugate (PCV13) vaccine. One dose is recommended after age 58. Pneumococcal polysaccharide (PPSV23) vaccine. One dose is recommended after age 71. Talk to your health care provider about which screenings and vaccines you need and how often you need them. This information is not intended to replace advice given to you by your health care provider. Make sure you discuss any questions you have with your health care provider. Document Released: 01/10/2016 Document Revised: 09/02/2016 Document Reviewed: 10/15/2015 Elsevier Interactive Patient Education  2017 [place] Prevention in the Home Falls can cause injuries. They can happen to people of all ages. There are many things you can do to make your home safe and to help prevent falls. What can I do on the outside of my home? Regularly fix the edges of walkways and driveways and fix any cracks. Remove anything that might make you trip as you walk through a door, such as a raised step or threshold. Trim any bushes or trees on the path to your home. Use bright outdoor lighting. Clear any walking paths of anything that might make someone trip, such as rocks or tools. Regularly check to see if handrails are loose or broken. Make sure that both sides of any steps have handrails. Any raised decks and porches should have guardrails on the edges. Have any leaves, snow, or ice cleared regularly. Use sand or salt on walking paths during winter. Clean up any spills in your garage right away. This includes oil or grease spills. What can I do in the bathroom? Use night lights. Install grab bars by the toilet and in the tub and shower. Do not use towel bars as grab bars. Use non-skid mats or decals in the tub or shower. If you need to sit down in  the shower, use a plastic, non-slip stool. Keep the floor dry. Clean up any water that spills on the floor as soon as it happens. Remove soap buildup in the tub or shower regularly. Attach bath mats securely with double-sided non-slip rug tape. Do not have throw rugs and other things on the floor that can make you trip. What can I do in the bedroom? Use night lights. Make sure that you have a light by your bed that is easy to reach. Do not use any sheets or blankets that are too big for your bed. They should not hang down onto the floor. Have a firm chair that has side arms. You can use this for support while you get dressed. Do not have throw rugs and other things on the floor that can make you trip. What can I do in the kitchen? Clean up any spills right away. Avoid walking on wet floors. Keep items that you use a lot in easy-to-reach places. If you need to reach something above you, use a strong step stool that has a grab bar. Keep electrical cords out of the way. Do not use  floor polish or wax that makes floors slippery. If you must use wax, use non-skid floor wax. Do not have throw rugs and other things on the floor that can make you trip. What can I do with my stairs? Do not leave any items on the stairs. Make sure that there are handrails on both sides of the stairs and use them. Fix handrails that are broken or loose. Make sure that handrails are as long as the stairways. Check any carpeting to make sure that it is firmly attached to the stairs. Fix any carpet that is loose or worn. Avoid having throw rugs at the top or bottom of the stairs. If you do have throw rugs, attach them to the floor with carpet tape. Make sure that you have a light switch at the top of the stairs and the bottom of the stairs. If you do not have them, ask someone to add them for you. What else can I do to help prevent falls? Wear shoes that: Do not have high heels. Have rubber bottoms. Are comfortable  and fit you well. Are closed at the toe. Do not wear sandals. If you use a stepladder: Make sure that it is fully opened. Do not climb a closed stepladder. Make sure that both sides of the stepladder are locked into place. Ask someone to hold it for you, if possible. Clearly mark and make sure that you can see: Any grab bars or handrails. First and last steps. Where the edge of each step is. Use tools that help you move around (mobility aids) if they are needed. These include: Canes. Walkers. Scooters. Crutches. Turn on the lights when you go into a dark area. Replace any light bulbs as soon as they burn out. Set up your furniture so you have a clear path. Avoid moving your furniture around. If any of your floors are uneven, fix them. If there are any pets around you, be aware of where they are. Review your medicines with your doctor. Some medicines can make you feel dizzy. This can increase your chance of falling. Ask your doctor what other things that you can do to help prevent falls. This information is not intended to replace advice given to you by your health care provider. Make sure you discuss any questions you have with your health care provider. Document Released: 10/10/2009 Document Revised: 05/21/2016 Document Reviewed: 01/18/2015 Elsevier Interactive Patient Education  2017 [employer].

## 2023-03-30 ENCOUNTER — Encounter (INDEPENDENT_AMBULATORY_CARE_PROVIDER_SITE_OTHER): Payer: Self-pay | Admitting: Nurse Practitioner

## 2023-03-30 NOTE — Progress Notes (Signed)
Subjective:    Patient ID: Laquasha I Auble, female    DOB: [birth year], 89 y.o.   MRN: [id number] Chief Complaint  Patient presents with   New Patient (Initial Visit)    NP reflux +consult; leg edema. rle wound, initial encounter    Patient is seen for evaluation of leg pain and swelling associated with new onset ulceration. The patient first noticed the swelling remotely. The swelling is associated with pain and discoloration. The pain and swelling worsens with prolonged dependency and improves with elevation. The pain is unrelated to activity.  The patient notes that in the morning the legs are better but the leg symptoms worsened throughout the course of the day. The patient has also noted a progressive worsening of the discoloration in the ankle and shin area.   The patient notes that an ulcer has developed acutely without specific trauma and since it occurred it has been very slow to heal.  There is a moderate amount of drainage associated with the open area.  The wound is also very painful.  The patient denies claudication symptoms or rest pain symptoms.   The patient has not had any past angiography, interventions or vascular surgery.   Elevation makes the leg symptoms better, dependency makes them much worse. The patient denies any recent changes in medications.  The patient has not been wearing graduated compression.  The patient denies a history of DVT or PE. There is no prior history of phlebitis. There is no history of primary lymphedema.  No history of malignancies. No history of trauma or groin or pelvic surgery. There is no history of radiation treatment to the groin or pelvis    Today noninvasive study shows no evidence of DVT or superficial thrombophlebitis.  No evidence of deep venous insufficiency or superficial venous reflux.    Review of Systems  Cardiovascular:  Positive for leg swelling.  Skin:  Positive for wound.  All other systems reviewed and are  negative.      Objective:   Physical Exam Vitals reviewed.  HENT:     Head: Normocephalic.  Cardiovascular:     Rate and Rhythm: Normal rate.  Pulmonary:     Effort: Pulmonary effort is normal.  Musculoskeletal:     Right lower leg: Edema present.     Left lower leg: Edema present.  Skin:    General: Skin is warm and dry.  Neurological:     Mental Status: She is alert and oriented to person, place, and time.  Psychiatric:        Mood and Affect: Mood normal.        Behavior: Behavior normal.        Thought Content: Thought content normal.        Judgment: Judgment normal.     BP (!) 163/71 (BP Location: Right Arm)   Pulse 83   Resp 18   Ht 5\' 2"  (1.575 m)   Wt 148 lb 3.2 oz (67.2 kg)   BMI 27.11 kg/m   Past Medical History:  Diagnosis Date   Abnormal glucose    Anxiety    Arthritis    Breast cancer 1996   Left- mastectomy   Cancer    melanoma   Chronic headaches    Depression    Dysplastic nevus 05/11/2007   right upper gastric area. Slight to moderate atypia.   Dysplastic nevus 06/24/2020   R lateral deltoid, moderate to severe atypia. Excised 08/13/2020, margins free.   GERD (gastroesophageal reflux  disease)    H/O melanoma excision    History of diverticulitis    History of recurrent UTIs    Hypertension    Melanoma    scalp   Osteoporosis    pre-osetoporosis per patient history form 03/18/12   Pre-diabetes    Vitamin D deficiency     Social History   Socioeconomic History   Marital status: Single    Spouse name: Not on file   Number of children: 0   Years of education: Not on file   Highest education level: Master's degree (e.g., MA, MS, MEng, MEd, MSW, MBA)  Occupational History   Occupation: Retired  Tobacco Use   Smoking status: Never   Smokeless tobacco: Never  Vaping Use   Vaping Use: Never used  Substance and Sexual Activity   Alcohol use: No    Alcohol/week: 0.0 standard drinks of alcohol   Drug use: Never   Sexual activity:  Not Currently  Other Topics Concern   Not on file  Social History Narrative   She lives by herself   She has nieces and nephews in the area   She was a Pharmacist, 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  (03/25/2023)   Overall Financial Resource Strain (CARDIA)    Difficulty of Paying Living Expenses: Not hard at all  Food Insecurity: No [place] (03/25/2023)   Hunger Vital Sign    Worried About Running Out of Food in the Last Year: Never true    [place] in the Last Year: Never true  Transportation Needs: No Transportation Needs (03/25/2023)   PRAPARE - Hydrologist (Medical): No    Lack of Transportation (Non-Medical): No  Physical Activity: Inactive (03/25/2023)   Exercise Vital Sign    Days of Exercise per Week: 0 days    Minutes of Exercise per Session: 0 min  Stress: No Stress Concern Present (03/25/2023)   [place]    Feeling of Stress : Not at all  Social Connections: Moderately Isolated (03/25/2023)   Social Connection and Isolation Panel [NHANES]    Frequency of Communication with Friends and Family: More than three times a week    Frequency of Social Gatherings with Friends and Family: Never    Attends Religious Services: 1 to 4 times per year    Active Member of [employer] or Organizations: No    Attends Archivist Meetings: Never    Marital Status: Never married  Intimate Partner Violence: Not At Risk (03/25/2023)   Humiliation, Afraid, Rape, and Kick questionnaire    Fear of Current or Ex-Partner: No    Emotionally Abused: No    Physically Abused: No    Sexually Abused: No    Past Surgical History:  Procedure Laterality Date   ABDOMINAL HYSTERECTOMY  2001   BREAST BIOPSY  2010   BREAST EXCISIONAL BIOPSY Right 1997   Benign excisional biopsy    CATARACT EXTRACTION Left 01/30/2021   Dr.  Luberta Mutter   COLONOSCOPY  04/02/2014   COLONOSCOPY WITH PROPOFOL N/A 12/27/2018   Procedure: COLONOSCOPY WITH PROPOFOL;  Surgeon: Lucilla Lame, MD;  Location: [facility] ENDOSCOPY;  Service: Endoscopy;  Laterality: N/A;   CYSTOSCOPY  11/2014   ESOPHAGOGASTRODUODENOSCOPY (EGD) WITH PROPOFOL N/A 12/27/2018   Procedure: ESOPHAGOGASTRODUODENOSCOPY (EGD) WITH PROPOFOL;  Surgeon: Lucilla Lame, MD;  Location: [facility] ENDOSCOPY;  Service:  Endoscopy;  Laterality: N/A;   ESOPHAGOGASTRODUODENOSCOPY (EGD) WITH PROPOFOL N/A 01/07/2021   Procedure: ESOPHAGOGASTRODUODENOSCOPY (EGD) WITH PROPOFOL;  Surgeon: Lucilla Lame, MD;  Location: [facility] ENDOSCOPY;  Service: Endoscopy;  Laterality: N/A;   GIVENS CAPSULE STUDY N/A 01/07/2021   Procedure: GIVENS CAPSULE STUDY;  Surgeon: Lucilla Lame, MD;  Location: [facility] ENDOSCOPY;  Service: Endoscopy;  Laterality: N/A;   MASTECTOMY  1996   Left   MELANOMA EXCISION  1995   Head    Family History  Problem Relation Age of Onset   Breast cancer Mother    Migraines Mother    Diabetes Father    CAD Father    Thrombosis Father    Aortic aneurysm Sister    Biliary Cirrhosis Brother    Breast cancer Other        Niece    No Known Allergies     Latest Ref Rng & Units 10/16/2022    1:55 PM 04/15/2022    2:37 PM 12/02/2021    2:42 PM  CBC  WBC 3.8 - 10.8 Thousand/uL 6.7  6.5  4.7   Hemoglobin 11.7 - 15.5 g/dL 12.8  13.3  13.2   Hematocrit 35.0 - 45.0 % 38.0  40.9  39.7   Platelets 140 - 400 Thousand/uL 160  301  181       CMP     Component Value Date/Time   NA 143 10/16/2022 1355   NA 142 06/19/2016 0744   NA 143 02/24/2013 0639   K 4.1 10/16/2022 1355   K 3.8 02/24/2013 0639   CL 107 10/16/2022 1355   CL 110 (H) 02/24/2013 0639   CO2 27 10/16/2022 1355   CO2 27 02/24/2013 0639   GLUCOSE 95 10/16/2022 1355   GLUCOSE 90 02/24/2013 0639   BUN 31 (H) 10/16/2022 1355   BUN 27 06/19/2016 0744   BUN 11 02/24/2013 0639   CREATININE 0.91 10/16/2022 1355    CALCIUM 9.8 10/16/2022 1355   CALCIUM 8.3 (L) 02/24/2013 0639   PROT 7.1 10/16/2022 1355   PROT 6.7 06/19/2016 0744   PROT 7.8 02/23/2013 0808   ALBUMIN 4.0 06/19/2016 0744   ALBUMIN 4.0 02/23/2013 0808   AST 17 10/16/2022 1355   AST 34 02/23/2013 0808   ALT 11 10/16/2022 1355   ALT 26 02/23/2013 0808   ALKPHOS 67 06/19/2016 0744   ALKPHOS 108 02/23/2013 0808   BILITOT 0.3 10/16/2022 1355   BILITOT 0.3 06/19/2016 0744   BILITOT 0.4 02/23/2013 0808   GFRNONAA 73 10/22/2020 0808   GFRAA 85 10/22/2020 0808     No results found.     Assessment & Plan:   1. Varicose veins of leg with swelling, bilateral Today the patient has evidence of a superficial ulceration in the right lower extremity.  Today she has no evidence of deep venous insufficiency or superficial venous reflux.  I suspect that the underlying ulceration cause is related to lymphedema.  Will place the patient in [place] boots for the next several weeks in order to heal the wound as well as get the swelling under control.  The patient return in about 2 weeks to reevaluate.  Patient is also advised to continue with elevation as possible.  2. Cellulitis of right lower extremity We will prescribe Bactrim for the wound   Current Outpatient Medications on File Prior to Visit  Medication Sig Dispense Refill   butalbital-acetaminophen-caffeine (FIORICET) 50-325-40 MG tablet TAKE 1 TABLET BY MOUTH EVERY 6 HOURS AS NEEDED 60 tablet 0  Calcium-Magnesium 500-250 MG TABS Take 1 tablet by mouth 2 (two) times daily.     CINNAMON PO Take 1 capsule by mouth daily. 350 mg     Cranberry 500 MG CAPS Take 1 tablet by mouth daily.     esomeprazole (NEXIUM) 20 MG capsule Take 1 capsule (20 mg total) by mouth daily. 90 capsule 1   FEROSUL 325 (65 Fe) MG tablet TAKE 1 TABLET BY MOUTH DAILY (Patient not taking: Reported on 03/25/2023) 100 tablet 1   furosemide (LASIX) 20 MG tablet Take 1 tablet (20 mg total) by mouth daily as needed for edema or  fluid (in am take with potassium supplement). 14 tablet 0   Misc Natural Products (LEG VEIN & CIRCULATION) TABS Take 2 capsules by mouth daily.     Multiple Vitamins-Calcium (ESSENTIAL ONE DAILY MULTIVIT) TABS Take by mouth.     mupirocin ointment (BACTROBAN) 2 % Apply 1 Application topically daily. 22 g 0   naproxen (NAPROSYN) 500 MG tablet TAKE 1 TABLET(500 MG) BY MOUTH TWICE DAILY WITH A MEAL 20 tablet 0   Omega-3 Fatty Acids (FISH OIL) 1000 MG CAPS Take by mouth.     potassium chloride SA (KLOR-CON M) 20 MEQ tablet Take 1 tablet (20 mEq total) by mouth daily as needed (take supplement when taking lasix/diuretic). 15 tablet 0   triamcinolone cream (KENALOG) 0.1 % Apply topically to lower legs 3-5 days a week for rash. Avoid applying to face, groin, and axilla. Use as directed. 80 g 3   No current facility-administered medications on file prior to visit.    There are no Patient Instructions on file for this visit. No follow-ups on file.   Kris Hartmann, NP

## 2023-03-31 ENCOUNTER — Encounter (INDEPENDENT_AMBULATORY_CARE_PROVIDER_SITE_OTHER): Payer: Medicare PPO

## 2023-03-31 ENCOUNTER — Ambulatory Visit: Payer: Self-pay

## 2023-03-31 NOTE — Telephone Encounter (Signed)
  Chief Complaint: sore throat Symptoms: sore throat, runny nose, cough Frequency: 3 days Pertinent Negatives: Patient denies fever or SOB Disposition: [] ED /[] Urgent Care (no appt availability in office) / [] Appointment(In office/virtual)/ []  [facility] [facility]/ [x] Home Care/ [] Refused Recommended Disposition /[] [place] [facility]/ []  Follow-up with PCP Additional Notes: pt states she is taking Allegra and got some Delsym for cough and helps with the sore throat too, has had 2 doses so far. Pt asking how long to try OTC meds before she needs to come in. I advised her to continue trying and if no improvement by Friday to call back and schedule OV. Pt verbalized understanding.   Summary: Sore Throat and Medicatons   Sore throat for 3 days, questions regarding what she can take, and how long should a sore throat last. Please advise         Reason for Disposition  [1] Sore throat with cough/cold symptoms AND [2] present < 5 days  Answer Assessment - Initial Assessment Questions 1. ONSET: "When did the throat start hurting?" (Hours or days ago)      3 days  2. SEVERITY: "How bad is the sore throat?" (Scale 1-10; mild, moderate or severe)   - MILD (1-3):  Doesn't interfere with eating or normal activities.   - MODERATE (4-7): Interferes with eating some solids and normal activities.   - SEVERE (8-10):  Excruciating pain, interferes with most normal activities.   - SEVERE WITH DYSPHAGIA (10): Can't swallow liquids, drooling.     Moderate  4.  VIRAL SYMPTOMS: "Are there any symptoms of a cold, such as a runny nose, cough, hoarse voice or red eyes?"      yes 5. FEVER: "Do you have a fever?" If Yes, ask: "What is your temperature, how was it measured, and when did it start?"     no 7. OTHER SYMPTOMS: "Do you have any other symptoms?" (e.g., difficulty breathing, headache, rash)     Cough and runny nose  Protocols used: Sore Throat-A-AH

## 2023-04-01 ENCOUNTER — Ambulatory Visit: Payer: Self-pay

## 2023-04-01 NOTE — Telephone Encounter (Signed)
  Chief Complaint: COVID + also UTI s/s Symptoms: Cough, HA, fatigue, urinary frequency Frequency: Sunday - Monday Pertinent Negatives: Patient denies fever Disposition: [] ED /[] Urgent Care (no appt availability in office) / [x] Appointment(In office/virtual)/ []  [place] [facility]/ [] Home Care/ [] Refused Recommended Disposition /[] [place] [facility]/ []  Follow-up with PCP Additional Notes: PT started s/s of COVID Sunday-Monday.  PT tested + today. S/s are mild moderate. Cough, HA, Runny nose.  PT also has urinary frequency. Pt states that she has UTIs very often.  Answer Assessment - Initial Assessment Questions 1. COVID-19 DIAGNOSIS: "How do you know that you have COVID?" (e.g., positive lab test or self-test, diagnosed by doctor or NP/PA, symptoms after exposure).     Today + 2. COVID-19 EXPOSURE: "Was there any known exposure to COVID before the symptoms began?" CDC Definition of close contact: within 6 feet (2 meters) for a total of 15 minutes or more over a 24-hour period.      no 3. ONSET: "When did the COVID-19 symptoms start?"      Sunday  4. WORST SYMPTOM: "What is your worst symptom?" (e.g., cough, fever, shortness of breath, muscle aches)     Cough, HA, Runny nose fatigue 5. COUGH: "Do you have a cough?" If Yes, ask: "How bad is the cough?"       yes 6. FEVER: "Do you have a fever?" If Yes, ask: "What is your temperature, how was it measured, and when did it start?"     yes 7. RESPIRATORY STATUS: "Describe your breathing?" (e.g., normal; shortness of breath, wheezing, unable to speak)      OK 8. BETTER-SAME-WORSE: "Are you getting better, staying the same or getting worse compared to yesterday?"  If getting worse, ask, "In what way?"     worse 9. OTHER SYMPTOMS: "Do you have any other symptoms?"  (e.g., chills, fatigue, headache, loss of smell or taste, muscle pain, sore throat)     Fatigue, HA, Sore throat 10. HIGH RISK DISEASE: "Do you have any chronic medical  problems?" (e.g., asthma, heart or lung disease, weak immune system, obesity, etc.)       no  Protocols used: Coronavirus (COVID-19) Diagnosed or Suspected-A-AH

## 2023-04-01 NOTE — Telephone Encounter (Signed)
Summary: COVID advice   Pt is calling to report that she tested positive for COVID today. Sx began Sunday night or Monday. Sx now include runny nose, sore throat, headache, coughing,     Reason for Disposition  [1] HIGH RISK patient (e.g., weak immune system, age > 20 years, obesity with BMI 30 or higher, pregnant, chronic lung disease or other chronic medical condition) AND [2] COVID symptoms (e.g., cough, fever)  (Exceptions: Already seen by PCP and no new or worsening symptoms.)  Protocols used: Coronavirus (COVID-19) Diagnosed or Suspected-A-AH

## 2023-04-02 ENCOUNTER — Encounter: Payer: Self-pay | Admitting: Nurse Practitioner

## 2023-04-02 ENCOUNTER — Ambulatory Visit: Payer: Medicare PPO | Admitting: Nurse Practitioner

## 2023-04-02 ENCOUNTER — Other Ambulatory Visit: Payer: Self-pay

## 2023-04-02 VITALS — BP 126/74 | HR 102 | Temp 97.9°F | Resp 16 | Ht 62.0 in | Wt 148.8 lb

## 2023-04-02 DIAGNOSIS — U071 COVID-19: Secondary | ICD-10-CM

## 2023-04-02 DIAGNOSIS — R311 Benign essential microscopic hematuria: Secondary | ICD-10-CM | POA: Diagnosis not present

## 2023-04-02 DIAGNOSIS — R3915 Urgency of urination: Secondary | ICD-10-CM | POA: Diagnosis not present

## 2023-04-02 LAB — POCT URINALYSIS DIPSTICK
Bilirubin, UA: NEGATIVE
Glucose, UA: NEGATIVE
Ketones, UA: NEGATIVE
Nitrite, UA: POSITIVE
Protein, UA: POSITIVE — AB
Spec Grav, UA: 1.02 (ref 1.010–1.025)
Urobilinogen, UA: 0.2 E.U./dL
pH, UA: 5 (ref 5.0–8.0)

## 2023-04-02 MED ORDER — CEPHALEXIN 500 MG PO CAPS: 500.0000 mg | ORAL_CAPSULE | Freq: Two times a day (BID) | ORAL | 0 refills | Status: AC

## 2023-04-02 MED ORDER — BENZONATATE 100 MG PO CAPS
200.0000 mg | ORAL_CAPSULE | Freq: Two times a day (BID) | ORAL | 0 refills | Status: DC | PRN
Start: 2023-04-02 — End: 2023-04-20

## 2023-04-02 NOTE — Progress Notes (Signed)
BP 126/74   Pulse (!) 102   Temp 97.9 F (36.6 C) (Oral)   Resp 16   Ht 5\' 2"  (1.575 m)   Wt 148 lb 12.8 oz (67.5 kg)   SpO2 94%   BMI 27.22 kg/m    Subjective:    Patient ID: Christina Salas, female    DOB: 1933-06-16, 89 y.o.   MRN: [id number]  HPI: Christina Salas is a 89 y.o. female  Chief Complaint  Patient presents with   Covid Positive    Test on yesterday, cough, runny nose, sore throat for 5 days   Urinary Tract Infection    urgency   Covid positive:  patient report she started having cough, runny nose, sore throat five days ago. She tested positive for covid yesterday.  She has tried delsym, Careers adviser for her symptoms.  She denies any fever or shortness of breath.  Discussed paxlovid and patient does not want to take it.   Recommend taking allegra, flonase, mucinex, vitamin d, vitamin c, and zinc. Push fluids and get rest.   Will also send in tessalon perls.   Urinary urgency:  patient reports symptoms started Tuesday.  She denies any fever or back pain.  Urine dip shows positive protein, positive nitrites large leukocytes. Will start patient on keflex and send urine for culture.   Relevant past medical, surgical, family and social history reviewed and updated as indicated. Interim medical history since our last visit reviewed. Allergies and medications reviewed and updated.  Review of Systems  Constitutional: Negative for fever or weight change.  Respiratory: Negative for cough and shortness of breath.   Cardiovascular: Negative for chest pain or palpitations.  Gastrointestinal: Negative for abdominal pain, no bowel changes.  Musculoskeletal: Negative for gait problem or joint swelling.  Skin: Negative for rash.  Neurological: Negative for dizziness or headache.  No other specific complaints in a complete review of systems (except as listed in HPI above).      Objective:    BP 126/74   Pulse (!) 102   Temp 97.9 F (36.6 C) (Oral)   Resp 16   Ht 5'  2" (1.575 m)   Wt 148 lb 12.8 oz (67.5 kg)   SpO2 94%   BMI 27.22 kg/m   Wt Readings from Last 3 Encounters:  04/02/23 148 lb 12.8 oz (67.5 kg)  03/25/23 148 lb (67.1 kg)  03/24/23 148 lb 3.2 oz (67.2 kg)    Physical Exam  Constitutional: Patient appears well-developed and well-nourished.  No distress.  HEENT: head atraumatic, normocephalic, pupils equal and reactive to light, ears TMs clear, neck supple, throat within normal limits Cardiovascular: Normal rate, regular rhythm and normal heart sounds.  No murmur heard. No BLE edema. Pulmonary/Chest: Effort normal and breath sounds normal. No respiratory distress. Abdominal: Soft.  There is no tenderness. NO CVA tenderness Psychiatric: Patient has a normal mood and affect. behavior is normal. Judgment and thought content normal.   Results for orders placed or performed in visit on 04/02/23  POCT urinalysis dipstick  Result Value Ref Range   Color, UA gold    Clarity, UA cloudy    Glucose, UA Negative Negative   Bilirubin, UA negative    Ketones, UA negative    Spec Grav, UA 1.020 1.010 - 1.025   Blood, UA large    pH, UA 5.0 5.0 - 8.0   Protein, UA Positive (A) Negative   Urobilinogen, UA 0.2 0.2 or 1.0 E.U./dL   Nitrite,  UA positive    Leukocytes, UA Large (3+) (A) Negative   Appearance cloudy    Odor none       Assessment & Plan:   Problem List Items Addressed This Visit   None Visit Diagnoses     Urinary urgency    -  Primary   Start Keflex,  push fluids sending urine for culture.   Relevant Medications   cephALEXin (KEFLEX) 500 MG capsule   Other Relevant Orders   POCT urinalysis dipstick (Completed)   Urine Culture   COVID-19       Can continue to take Delsym and Allegra.  Also sent in [facility].  She can also take, vitamin D, vitamin c and zinc,  push fluids and get rest.   Relevant Medications   cephALEXin (KEFLEX) 500 MG capsule   benzonatate (TESSALON) 100 MG capsule       Discussed reasons to  seek emergency care.  Follow up plan: Return if symptoms worsen or fail to improve.

## 2023-04-04 LAB — URINE CULTURE
MICRO NUMBER:: 14789104
SPECIMEN QUALITY:: ADEQUATE

## 2023-04-06 ENCOUNTER — Ambulatory Visit: Payer: Medicare PPO | Admitting: Physician Assistant

## 2023-04-09 ENCOUNTER — Ambulatory Visit (INDEPENDENT_AMBULATORY_CARE_PROVIDER_SITE_OTHER): Payer: Medicare PPO | Admitting: Vascular Surgery

## 2023-04-13 ENCOUNTER — Encounter (INDEPENDENT_AMBULATORY_CARE_PROVIDER_SITE_OTHER): Payer: Self-pay | Admitting: Nurse Practitioner

## 2023-04-13 NOTE — Progress Notes (Signed)
Subjective:    Patient ID: Christina Salas, female    DOB: 12/06/1933, 89 y.o.   MRN: [id number] Chief Complaint  Patient presents with   Follow-up    1 week unna check    Patient returns today for evaluation following being in bilateral Unna boots for several weeks.  She has tolerated this well.  Previous wounds are healed.  Currently there are no blisters.  Patient has no evidence of claudication.  Previous noninvasive studies showed no evidence of DVT or superficial thrombophlebitis or deep venous insufficiency, or superficial venous reflux bilaterally.    Review of Systems  Cardiovascular:  Positive for leg swelling.  All other systems reviewed and are negative.      Objective:   Physical Exam Vitals reviewed.  HENT:     Head: Normocephalic.  Cardiovascular:     Rate and Rhythm: Normal rate.  Pulmonary:     Effort: Pulmonary effort is normal.  Musculoskeletal:     Right lower leg: Edema present.     Left lower leg: Edema present.  Skin:    General: Skin is warm and dry.  Neurological:     Mental Status: She is alert and oriented to person, place, and time.  Psychiatric:        Mood and Affect: Mood normal.        Behavior: Behavior normal.        Thought Content: Thought content normal.        Judgment: Judgment normal.     BP 134/66 (BP Location: Right Arm)   Pulse 70   Resp 15   Wt 148 lb 3.2 oz (67.2 kg)   BMI 27.11 kg/m   Past Medical History:  Diagnosis Date   Abnormal glucose    Anxiety    Arthritis    Breast cancer 1996   Left- mastectomy   Cancer    melanoma   Chronic headaches    Depression    Dysplastic nevus 05/11/2007   right upper gastric area. Slight to moderate atypia.   Dysplastic nevus 06/24/2020   R lateral deltoid, moderate to severe atypia. Excised 08/13/2020, margins free.   GERD (gastroesophageal reflux disease)    H/O melanoma excision    History of diverticulitis    History of recurrent UTIs    Hypertension     Melanoma    scalp   Osteoporosis    pre-osetoporosis per patient history form 03/18/12   Pre-diabetes    Vitamin D deficiency     Social History   Socioeconomic History   Marital status: Single    Spouse name: Not on file   Number of children: 0   Years of education: Not on file   Highest education level: Master's degree (e.g., MA, MS, MEng, MEd, MSW, MBA)  Occupational History   Occupation: Retired  Tobacco Use   Smoking status: Never   Smokeless tobacco: Never  Vaping Use   Vaping Use: Never used  Substance and Sexual Activity   Alcohol use: No    Alcohol/week: 0.0 standard drinks of alcohol   Drug use: Never   Sexual activity: Not Currently  Other Topics Concern   Not on file  Social History Narrative   She lives by herself   She has nieces and nephews in the area   She was a Runner, 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, retired in 1992   Social Determinants of Health   Financial Resource Strain: Low Risk  (03/25/2023)   Overall Financial Resource  Strain (CARDIA)    Difficulty of Paying Living Expenses: Not hard at all  Food Insecurity: No Food Insecurity (03/25/2023)   Hunger Vital Sign    Worried About Running Out of Food in the Last Year: Never true    Ran Out of Food in the Last Year: Never true  Transportation Needs: No Transportation Needs (03/25/2023)   PRAPARE - Administrator, Civil Service (Medical): No    Lack of Transportation (Non-Medical): No  Physical Activity: Inactive (03/25/2023)   Exercise Vital Sign    Days of Exercise per Week: 0 days    Minutes of Exercise per Session: 0 min  Stress: No Stress Concern Present (03/25/2023)   [employer] of Occupational Health - Occupational Stress Questionnaire    Feeling of Stress : Not at all  Social Connections: Moderately Isolated (03/25/2023)   Social Connection and Isolation Panel [NHANES]    Frequency of Communication with Friends and Family: More than three times a week    Frequency of Social Gatherings  with Friends and Family: Never    Attends Religious Services: 1 to 4 times per year    Active Member of [employer] or Organizations: No    Attends Banker Meetings: Never    Marital Status: Never married  Intimate Partner Violence: Not At Risk (03/25/2023)   Humiliation, Afraid, Rape, and Kick questionnaire    Fear of Current or Ex-Partner: No    Emotionally Abused: No    Physically Abused: No    Sexually Abused: No    Past Surgical History:  Procedure Laterality Date   ABDOMINAL HYSTERECTOMY  2001   BREAST BIOPSY  2010   BREAST EXCISIONAL BIOPSY Right 1997   Benign excisional biopsy    CATARACT EXTRACTION Left 01/30/2021   Dr. Maris Berger   COLONOSCOPY  04/02/2014   COLONOSCOPY WITH PROPOFOL N/A 12/27/2018   Procedure: COLONOSCOPY WITH PROPOFOL;  Surgeon: Midge Minium, MD;  Location: [facility] ENDOSCOPY;  Service: Endoscopy;  Laterality: N/A;   CYSTOSCOPY  11/2014   ESOPHAGOGASTRODUODENOSCOPY (EGD) WITH PROPOFOL N/A 12/27/2018   Procedure: ESOPHAGOGASTRODUODENOSCOPY (EGD) WITH PROPOFOL;  Surgeon: Midge Minium, MD;  Location: [facility] ENDOSCOPY;  Service: Endoscopy;  Laterality: N/A;   ESOPHAGOGASTRODUODENOSCOPY (EGD) WITH PROPOFOL N/A 01/07/2021   Procedure: ESOPHAGOGASTRODUODENOSCOPY (EGD) WITH PROPOFOL;  Surgeon: Midge Minium, MD;  Location: [facility] ENDOSCOPY;  Service: Endoscopy;  Laterality: N/A;   GIVENS CAPSULE STUDY N/A 01/07/2021   Procedure: GIVENS CAPSULE STUDY;  Surgeon: Midge Minium, MD;  Location: [facility] ENDOSCOPY;  Service: Endoscopy;  Laterality: N/A;   MASTECTOMY  1996   Left   MELANOMA EXCISION  1995   Head    Family History  Problem Relation Age of Onset   Breast cancer Mother    Migraines Mother    Diabetes Father    CAD Father    Thrombosis Father    Aortic aneurysm Sister    Biliary Cirrhosis Brother    Breast cancer Other        Niece    No Known Allergies     Latest Ref Rng & Units 10/16/2022    1:55 PM 04/15/2022    2:37 PM 12/02/2021    2:42  PM  CBC  WBC 3.8 - 10.8 Thousand/uL 6.7  6.5  4.7   Hemoglobin 11.7 - 15.5 g/dL 16.1  09.6  04.5   Hematocrit 35.0 - 45.0 % 38.0  40.9  39.7   Platelets 140 - 400 Thousand/uL 160  301  181  CMP     Component Value Date/Time   NA 143 10/16/2022 1355   NA 142 06/19/2016 0744   NA 143 02/24/2013 0639   K 4.1 10/16/2022 1355   K 3.8 02/24/2013 0639   CL 107 10/16/2022 1355   CL 110 (H) 02/24/2013 0639   CO2 27 10/16/2022 1355   CO2 27 02/24/2013 0639   GLUCOSE 95 10/16/2022 1355   GLUCOSE 90 02/24/2013 0639   BUN 31 (H) 10/16/2022 1355   BUN 27 06/19/2016 0744   BUN 11 02/24/2013 0639   CREATININE 0.91 10/16/2022 1355   CALCIUM 9.8 10/16/2022 1355   CALCIUM 8.3 (L) 02/24/2013 0639   PROT 7.1 10/16/2022 1355   PROT 6.7 06/19/2016 0744   PROT 7.8 02/23/2013 0808   ALBUMIN 4.0 06/19/2016 0744   ALBUMIN 4.0 02/23/2013 0808   AST 17 10/16/2022 1355   AST 34 02/23/2013 0808   ALT 11 10/16/2022 1355   ALT 26 02/23/2013 0808   ALKPHOS 67 06/19/2016 0744   ALKPHOS 108 02/23/2013 0808   BILITOT 0.3 10/16/2022 1355   BILITOT 0.3 06/19/2016 0744   BILITOT 0.4 02/23/2013 0808   GFRNONAA 73 10/22/2020 0808   GFRAA 85 10/22/2020 0808     No results found.     Assessment & Plan:    1. Lymphedema Recommend:  I have had a long discussion with the patient regarding swelling and why it  causes symptoms.  She will transition from Unna boots to compression socks.  The patient will  wear the stockings first thing in the morning and removing them in the evening. The patient is instructed specifically not to sleep in the stockings.   In addition, behavioral modification will be initiated.  This will include frequent elevation, use of over the counter pain medications and exercise such as walking.  Consideration for a lymph pump will also be made based upon the effectiveness of conservative therapy.  This would help to improve the edema control and prevent sequela such as ulcers  and infections   Patient will follow-up in 3 months  2. Cellulitis of right lower extremity Wound is resolved   Current Outpatient Medications on File Prior to Visit  Medication Sig Dispense Refill   butalbital-acetaminophen-caffeine (FIORICET) 50-325-40 MG tablet TAKE 1 TABLET BY MOUTH EVERY 6 HOURS AS NEEDED 60 tablet 0   Calcium-Magnesium 500-250 MG TABS Take 1 tablet by mouth 2 (two) times daily.     CINNAMON PO Take 1 capsule by mouth daily. 350 mg     Cranberry 500 MG CAPS Take 1 tablet by mouth daily.     esomeprazole (NEXIUM) 20 MG capsule Take 1 capsule (20 mg total) by mouth daily. 90 capsule 1   FEROSUL 325 (65 Fe) MG tablet TAKE 1 TABLET BY MOUTH DAILY (Patient not taking: Reported on 03/25/2023) 100 tablet 1   furosemide (LASIX) 20 MG tablet Take 1 tablet (20 mg total) by mouth daily as needed for edema or fluid (in am take with potassium supplement). 14 tablet 0   Misc Natural Products (LEG VEIN & CIRCULATION) TABS Take 2 capsules by mouth daily.     Multiple Vitamins-Calcium (ESSENTIAL ONE DAILY MULTIVIT) TABS Take by mouth.     mupirocin ointment (BACTROBAN) 2 % Apply 1 Application topically daily. 22 g 0   naproxen (NAPROSYN) 500 MG tablet TAKE 1 TABLET(500 MG) BY MOUTH TWICE DAILY WITH A MEAL 20 tablet 0   Omega-3 Fatty Acids (FISH OIL) 1000 MG CAPS Take by  mouth.     potassium chloride SA (KLOR-CON M) 20 MEQ tablet Take 1 tablet (20 mEq total) by mouth daily as needed (take supplement when taking lasix/diuretic). 15 tablet 0   sulfamethoxazole-trimethoprim (BACTRIM DS) 800-160 MG tablet Take 1 tablet by mouth 2 (two) times daily. (Patient not taking: Reported on 04/02/2023) 14 tablet 0   triamcinolone cream (KENALOG) 0.1 % Apply topically to lower legs 3-5 days a week for rash. Avoid applying to face, groin, and axilla. Use as directed. 80 g 3   No current facility-administered medications on file prior to visit.    There are no Patient Instructions on file for this  visit. No follow-ups on file.   Georgiana Spinner, NP

## 2023-04-15 NOTE — Progress Notes (Deleted)
Name: Christina Salas   MRN: [id number]    DOB: 04/07/1933   Date:04/15/2023       Progress Note  Subjective  Chief Complaint  Follow Up  HPI  Cervical Radiculopathy: she has decrease rom of neck and  occasionally tingling down to right shoulder. She states her neck is causing problems because she can not look up to walk, we will refer her to PT    Tension headache: she is taking fioricet prn and needs a refill, she describes headaches as dull and or sharp, not as frequent now, she states baclofen also helps when she has a headache but causes drowsiness . Usually frontal, very seldom she has associated  nausea or vomiting. She still has some fioricet at home   Iron deficiency:She was seen by Dr. Lars Pinks back in 11/2018, had EGD and colonoscopy , she also had multiple AVM of small bowel , Dr. Lars Pinks discussed either push enteroscopy versus double balloon enteroscopy, or continue monitoring for anemia and take iron supplementation. She chose the later and last levels were back to normal . Last HCT was normal and iron studies back to normal also, she has not been taking iron supplementation since Dec 2022    Post-menopausal osteoporosis : she used to take Fosamax but stopped going due to having dental work done. Bone density is worse, bone density showed osteoporosis of her hip.    Mild Major Depression: she has long history of depression, she states not feeling great right now, states life is boring, denies suicidal thoughts or ideation, but has lack of energy and also lack of appetite She states her nephew is 26 yo and diagnosed with cancer and he is worried about him   Pre-diabetes: she denies polyphagia or polyuria, she feels her mouth gets dry at times. Last A1C was normal, explained to her that she needs to gain weight not to worry so much about her diet    GERD: controlled with Nexium at this time, she is aware of long term side effects. She denies heart burn or indigestion . She is under the  care of Dr. Servando Snare . Stable   Malnutrition: she lost 20 lbs from pre-pandemic to 2021, she continues to lose weight one year ago her weight was 152 lbs and today is down to 145 lbs. Her baseline weight used to be in the mid 170 lbs for years.  Discussed high calorie diet again. She states taking protein shakes but still avoids sweets. She states she skips meals   Angina: she sees Dr. Juliann Pares yearly - next visit in Dec . She states only has symptoms occasionally.   Echo from 2017  NORMAL LEFT VENTRICULAR SYSTOLIC FUNCTION WITH AN ESTIMATED EF = >55 %  NORMAL RIGHT VENTRICULAR SYSTOLIC FUNCTION  MILD TRICUSPID AND MITRAL VALVE INSUFFICIENCY  TRACE AORTIC VALVE INSUFFICIENCY  NO VALVULAR STENOSIS  MILD BIATRIAL ENLARGEMENT   ECG Interpretation: 2017  Rest ECG:  normal sinus rhythm, none  Stress ECG:  normal sinus rhythm,  Recovery ECG:  normal sinus rhythm  ECG Interpretation:  non-diagnostic due to pharmacologic testing  Neuropathy: she states sometimes burning and tingling numbness she tried topical medications, she states pain is not as severe now . Normal B12 and folate done April 2023   Vitamin D :she is taking 2000 units daily  and last level was at goal   Patient Active Problem List   Diagnosis Date Noted   Menopausal osteoporosis 10/16/2022   History of iron deficiency anemia  10/16/2022   AVM (arteriovenous malformation) of small bowel, acquired 10/16/2022   Neuropathy 10/16/2022   Mild protein-calorie malnutrition 10/16/2022   Stricture and stenosis of esophagus    Mild depression 01/24/2019   Large hiatal hernia 01/24/2019   Internal hemorrhoids 01/24/2019   Iron deficiency anemia due to chronic blood loss    Venous reflux 04/19/2018   Prediabetes 06/08/2016   Obesity (BMI 30.0-34.9) 06/08/2016   Vitamin D deficiency 06/08/2016   Post-menopausal 12/11/2015   Hyperglycemia 10/21/2015   DD (diverticular disease) 06/19/2015   Edema extremities 06/19/2015   Esophageal  reflux 06/19/2015   HLD (hyperlipidemia) 06/19/2015   Varicose veins of leg with swelling, bilateral 06/19/2015   Recurrent UTI 02/08/2013   Mixed incontinence 02/08/2013   History of breast cancer, left, T1, N0, Left MRM - Dec 1996 03/18/2012   Tension headache 05/24/2007   History of melanoma 08/28/1994    Past Surgical History:  Procedure Laterality Date   ABDOMINAL HYSTERECTOMY  2001   BREAST BIOPSY  2010   BREAST EXCISIONAL BIOPSY Right 1997   Benign excisional biopsy    CATARACT EXTRACTION Left 01/30/2021   Dr. Maris Berger   COLONOSCOPY  04/02/2014   COLONOSCOPY WITH PROPOFOL N/A 12/27/2018   Procedure: COLONOSCOPY WITH PROPOFOL;  Surgeon: Midge Minium, MD;  Location: [facility] ENDOSCOPY;  Service: Endoscopy;  Laterality: N/A;   CYSTOSCOPY  11/2014   ESOPHAGOGASTRODUODENOSCOPY (EGD) WITH PROPOFOL N/A 12/27/2018   Procedure: ESOPHAGOGASTRODUODENOSCOPY (EGD) WITH PROPOFOL;  Surgeon: Midge Minium, MD;  Location: [facility] ENDOSCOPY;  Service: Endoscopy;  Laterality: N/A;   ESOPHAGOGASTRODUODENOSCOPY (EGD) WITH PROPOFOL N/A 01/07/2021   Procedure: ESOPHAGOGASTRODUODENOSCOPY (EGD) WITH PROPOFOL;  Surgeon: Midge Minium, MD;  Location: [facility] ENDOSCOPY;  Service: Endoscopy;  Laterality: N/A;   GIVENS CAPSULE STUDY N/A 01/07/2021   Procedure: GIVENS CAPSULE STUDY;  Surgeon: Midge Minium, MD;  Location: [facility] ENDOSCOPY;  Service: Endoscopy;  Laterality: N/A;   MASTECTOMY  1996   Left   MELANOMA EXCISION  1995   Head    Family History  Problem Relation Age of Onset   Breast cancer Mother    Migraines Mother    Diabetes Father    CAD Father    Thrombosis Father    Aortic aneurysm Sister    Biliary Cirrhosis Brother    Breast cancer Other        Niece    Social History   Tobacco Use   Smoking status: Never   Smokeless tobacco: Never  Substance Use Topics   Alcohol use: No    Alcohol/week: 0.0 standard drinks of alcohol     Current Outpatient Medications:    benzonatate  (TESSALON) 100 MG capsule, Take 2 capsules (200 mg total) by mouth 2 (two) times daily as needed for cough., Disp: 20 capsule, Rfl: 0   butalbital-acetaminophen-caffeine (FIORICET) 50-325-40 MG tablet, TAKE 1 TABLET BY MOUTH EVERY 6 HOURS AS NEEDED, Disp: 60 tablet, Rfl: 0   Calcium-Magnesium 500-250 MG TABS, Take 1 tablet by mouth 2 (two) times daily., Disp: , Rfl:    CINNAMON PO, Take 1 capsule by mouth daily. 350 mg, Disp: , Rfl:    Cranberry 500 MG CAPS, Take 1 tablet by mouth daily., Disp: , Rfl:    esomeprazole (NEXIUM) 20 MG capsule, Take 1 capsule (20 mg total) by mouth daily., Disp: 90 capsule, Rfl: 1   FEROSUL 325 (65 Fe) MG tablet, TAKE 1 TABLET BY MOUTH DAILY (Patient not taking: Reported on 03/25/2023), Disp: 100 tablet, Rfl: 1   furosemide (  LASIX) 20 MG tablet, Take 1 tablet (20 mg total) by mouth daily as needed for edema or fluid (in am take with potassium supplement)., Disp: 14 tablet, Rfl: 0   Misc Natural Products (LEG VEIN & CIRCULATION) TABS, Take 2 capsules by mouth daily., Disp: , Rfl:    Multiple Vitamins-Calcium (ESSENTIAL ONE DAILY MULTIVIT) TABS, Take by mouth., Disp: , Rfl:    mupirocin ointment (BACTROBAN) 2 %, Apply 1 Application topically daily., Disp: 22 g, Rfl: 0   naproxen (NAPROSYN) 500 MG tablet, TAKE 1 TABLET(500 MG) BY MOUTH TWICE DAILY WITH A MEAL, Disp: 20 tablet, Rfl: 0   Omega-3 Fatty Acids (FISH OIL) 1000 MG CAPS, Take by mouth., Disp: , Rfl:    potassium chloride SA (KLOR-CON M) 20 MEQ tablet, Take 1 tablet (20 mEq total) by mouth daily as needed (take supplement when taking lasix/diuretic)., Disp: 15 tablet, Rfl: 0   sulfamethoxazole-trimethoprim (BACTRIM DS) 800-160 MG tablet, Take 1 tablet by mouth 2 (two) times daily. (Patient not taking: Reported on 04/02/2023), Disp: 14 tablet, Rfl: 0   triamcinolone cream (KENALOG) 0.1 %, Apply topically to lower legs 3-5 days a week for rash. Avoid applying to face, groin, and axilla. Use as directed., Disp: 80 g, Rfl:  3  No Known Allergies  I personally reviewed active problem list, medication list, allergies, family history, social history, health maintenance with the patient/caregiver today.   ROS  ***  Objective  There were no vitals filed for this visit.  There is no height or weight on file to calculate BMI.  Physical Exam ***   PHQ2/9:    04/02/2023   10:25 AM 03/25/2023    2:14 PM 03/08/2023    1:36 PM 03/04/2023    3:14 PM 10/16/2022    1:07 PM  Depression screen PHQ 2/9  Decreased Interest 0 0 0 0 0  Down, Depressed, Hopeless 0 0 0 0 0  PHQ - 2 Score 0 0 0 0 0  Altered sleeping  0 0 0 3  Tired, decreased energy  0 0 0 2  Change in appetite  0 0 0 0  Feeling bad or failure about yourself   0 0 0 1  Trouble concentrating  0 0 0 0  Moving slowly or fidgety/restless  0 0 0 0  Suicidal thoughts  0 0 0 0  PHQ-9 Score  0 0 0 6  Difficult doing work/chores  Not difficult at all Not difficult at all Not difficult at all     phq 9 is {gen pos [id number]}   Fall Risk:    04/02/2023   10:25 AM 03/25/2023    2:10 PM 03/08/2023    1:35 PM 03/04/2023    3:14 PM 10/16/2022    1:06 PM  Fall Risk   Falls in the past year? 0 0 0 0 1  Number falls in past yr: 0 0 0 0 0  Injury with Fall? 0 0 0 0 1  Risk for fall due to :  No Fall Risks No Fall Risks No Fall Risks Impaired mobility;Impaired balance/gait  Follow up  Education provided;Falls prevention discussed Falls prevention discussed;Education provided;Falls evaluation completed Falls prevention discussed;Education provided;Falls evaluation completed Falls prevention discussed      Functional Status Survey:      Assessment & Plan  *** There are no diagnoses linked to this encounter.

## 2023-04-16 ENCOUNTER — Ambulatory Visit: Payer: Medicare PPO | Admitting: Family Medicine

## 2023-04-19 NOTE — Progress Notes (Unsigned)
Name: Christina Salas   MRN: [id number]    DOB: 05/03/1933   Date:04/20/2023       Progress Note  Subjective  Chief Complaint  Follow Up  HPI  Cervical Radiculopathy: she has decrease rom of neck and  occasionally tingling down to right shoulder but not lately.  She states her neck is causing problems because she can not look up to walk, she had one session of PT but has not been back, she will contact them to re-schedule it    Tension headache: she is taking fioricet prn and needs a refill, she describes headaches as dull and or sharp, not as frequent now, she states baclofen also helps when she has a headache but causes drowsiness . Usually frontal, very seldom she has associated  nausea or vomiting. She has been taking Fioricet more often, discussed controlled medication and to last 6 months from now on   Iron deficiency:She was seen by Dr. Lars Pinks back in 11/2018, had EGD and colonoscopy , she also had multiple AVM of small bowel , Dr. Lars Pinks discussed either push enteroscopy versus double balloon enteroscopy, or continue monitoring for anemia and take iron supplementation. She chose the later and last levels were back to normal, however iron storage dropping and she recently stopped taking iron, advised to take it at least three times a week    Post-menopausal osteoporosis : she used to take Fosamax but stopped going due to having dental work done.  She is getting Prolia now with Dr. Patrecia Pace    Mild Major Depression: she has long history of depression, she states not feeling great right now, states life is boring, denies suicidal thoughts or ideation, but has lack of energy and also lack of appetite She states her nephew is 16 yo and diagnosed with cancer and he is worried about him   Pre-diabetes: she denies polyphagia or polyuria, she feels her mouth gets dry at times. Last A1C was normal, weight is stable now    GERD: controlled with Nexium at this time, she is aware of long term side  effects. She denies heart burn or indigestion . She is under the care of Dr. Servando Snare .  Unchanged   Malnutrition: she lost 20 lbs from pre-pandemic to 2021, baseline weight was 170 lbs but now in the mid 140's , she does not want to gain any more weight.   Angina: she sees Dr. Juliann Pares yearly. She states only has symptoms occasionally. Stable   Echo from 2017  NORMAL LEFT VENTRICULAR SYSTOLIC FUNCTION WITH AN ESTIMATED EF = >55 %  NORMAL RIGHT VENTRICULAR SYSTOLIC FUNCTION  MILD TRICUSPID AND MITRAL VALVE INSUFFICIENCY  TRACE AORTIC VALVE INSUFFICIENCY  NO VALVULAR STENOSIS  MILD BIATRIAL ENLARGEMENT   ECG Interpretation: 2017  Rest ECG:  normal sinus rhythm, none  Stress ECG:  normal sinus rhythm,  Recovery ECG:  normal sinus rhythm  ECG Interpretation:  non-diagnostic due to pharmacologic testing  Neuropathy: she states sometimes burning and tingling numbness she tried topical medications, she states pain is not as severe now .Stable   Vitamin D :she is taking 2000 units daily  and last level was at goal , continue supplementation   Recurrent UTI: she was seen by Della Goo NP a few weeks ago and treated with Septra, she had symptoms again last week and called Dr. Lawerance Bach , he sent in antibiotics - Macrobid with refills. She states symptoms resolved.   Patient Active Problem List   Diagnosis Date Noted  Menopausal osteoporosis 10/16/2022   History of iron deficiency anemia 10/16/2022   AVM (arteriovenous malformation) of small bowel, acquired 10/16/2022   Neuropathy 10/16/2022   Mild protein-calorie malnutrition 10/16/2022   Stricture and stenosis of esophagus    Mild depression 01/24/2019   Large hiatal hernia 01/24/2019   Internal hemorrhoids 01/24/2019   Iron deficiency anemia due to chronic blood loss    Venous reflux 04/19/2018   Prediabetes 06/08/2016   Obesity (BMI 30.0-34.9) 06/08/2016   Vitamin D deficiency 06/08/2016   Post-menopausal 12/11/2015   Hyperglycemia  10/21/2015   DD (diverticular disease) 06/19/2015   Edema extremities 06/19/2015   Esophageal reflux 06/19/2015   HLD (hyperlipidemia) 06/19/2015   Varicose veins of leg with swelling, bilateral 06/19/2015   Recurrent UTI 02/08/2013   Mixed incontinence 02/08/2013   History of breast cancer, left, T1, N0, Left MRM - Dec 1996 03/18/2012   Tension headache 05/24/2007   History of melanoma 08/28/1994    Past Surgical History:  Procedure Laterality Date   ABDOMINAL HYSTERECTOMY  2001   BREAST BIOPSY  2010   BREAST EXCISIONAL BIOPSY Right 1997   Benign excisional biopsy    CATARACT EXTRACTION Left 01/30/2021   Dr. Maris Berger   COLONOSCOPY  04/02/2014   COLONOSCOPY WITH PROPOFOL N/A 12/27/2018   Procedure: COLONOSCOPY WITH PROPOFOL;  Surgeon: Midge Minium, MD;  Location: [facility] ENDOSCOPY;  Service: Endoscopy;  Laterality: N/A;   CYSTOSCOPY  11/2014   ESOPHAGOGASTRODUODENOSCOPY (EGD) WITH PROPOFOL N/A 12/27/2018   Procedure: ESOPHAGOGASTRODUODENOSCOPY (EGD) WITH PROPOFOL;  Surgeon: Midge Minium, MD;  Location: [facility] ENDOSCOPY;  Service: Endoscopy;  Laterality: N/A;   ESOPHAGOGASTRODUODENOSCOPY (EGD) WITH PROPOFOL N/A 01/07/2021   Procedure: ESOPHAGOGASTRODUODENOSCOPY (EGD) WITH PROPOFOL;  Surgeon: Midge Minium, MD;  Location: [facility] ENDOSCOPY;  Service: Endoscopy;  Laterality: N/A;   GIVENS CAPSULE STUDY N/A 01/07/2021   Procedure: GIVENS CAPSULE STUDY;  Surgeon: Midge Minium, MD;  Location: [facility] ENDOSCOPY;  Service: Endoscopy;  Laterality: N/A;   MASTECTOMY  1996   Left   MELANOMA EXCISION  1995   Head    Family History  Problem Relation Age of Onset   Breast cancer Mother    Migraines Mother    Diabetes Father    CAD Father    Thrombosis Father    Aortic aneurysm Sister    Biliary Cirrhosis Brother    Breast cancer Other        Niece    Social History   Tobacco Use   Smoking status: Never   Smokeless tobacco: Never  Substance Use Topics   Alcohol use: No     Alcohol/week: 0.0 standard drinks of alcohol     Current Outpatient Medications:    butalbital-acetaminophen-caffeine (FIORICET) 50-325-40 MG tablet, TAKE 1 TABLET BY MOUTH EVERY 6 HOURS AS NEEDED, Disp: 60 tablet, Rfl: 0   Calcium-Magnesium 500-250 MG TABS, Take 1 tablet by mouth 2 (two) times daily., Disp: , Rfl:    CINNAMON PO, Take 1 capsule by mouth daily. 350 mg, Disp: , Rfl:    Cranberry 500 MG CAPS, Take 1 tablet by mouth daily., Disp: , Rfl:    esomeprazole (NEXIUM) 20 MG capsule, Take 1 capsule (20 mg total) by mouth daily., Disp: 90 capsule, Rfl: 1   furosemide (LASIX) 20 MG tablet, Take 1 tablet (20 mg total) by mouth daily as needed for edema or fluid (in am take with potassium supplement)., Disp: 14 tablet, Rfl: 0   Misc Natural Products (LEG VEIN & CIRCULATION) TABS, Take 2 capsules  by mouth daily., Disp: , Rfl:    Multiple Vitamins-Calcium (ESSENTIAL ONE DAILY MULTIVIT) TABS, Take by mouth., Disp: , Rfl:    mupirocin ointment (BACTROBAN) 2 %, Apply 1 Application topically daily., Disp: 22 g, Rfl: 0   naproxen (NAPROSYN) 500 MG tablet, TAKE 1 TABLET(500 MG) BY MOUTH TWICE DAILY WITH A MEAL, Disp: 20 tablet, Rfl: 0   Omega-3 Fatty Acids (FISH OIL) 1000 MG CAPS, Take by mouth., Disp: , Rfl:    potassium chloride SA (KLOR-CON M) 20 MEQ tablet, Take 1 tablet (20 mEq total) by mouth daily as needed (take supplement when taking lasix/diuretic)., Disp: 15 tablet, Rfl: 0   triamcinolone cream (KENALOG) 0.1 %, Apply topically to lower legs 3-5 days a week for rash. Avoid applying to face, groin, and axilla. Use as directed., Disp: 80 g, Rfl: 3   benzonatate (TESSALON) 100 MG capsule, Take 2 capsules (200 mg total) by mouth 2 (two) times daily as needed for cough. (Patient not taking: Reported on 04/20/2023), Disp: 20 capsule, Rfl: 0   FEROSUL 325 (65 Fe) MG tablet, TAKE 1 TABLET BY MOUTH DAILY (Patient not taking: Reported on 03/25/2023), Disp: 100 tablet, Rfl: 1    sulfamethoxazole-trimethoprim (BACTRIM DS) 800-160 MG tablet, Take 1 tablet by mouth 2 (two) times daily. (Patient not taking: Reported on 04/02/2023), Disp: 14 tablet, Rfl: 0  No Known Allergies  I personally reviewed active problem list, medication list, allergies, family history, social history, health maintenance with the patient/caregiver today.   ROS  Ten systems reviewed and is negative except as mentioned in HPI   Objective  Vitals:   04/20/23 1050  BP: 134/70  Pulse: 74  Resp: 16  SpO2: 98%  Weight: 149 lb (67.6 kg)  Height:  (1.575 m)    Body mass index is 27.25 kg/m.  Physical Exam  Constitutional: Patient appears well-developed and well-nourished.  No distress.  HEENT: head atraumatic, normocephalic, pupils equal and reactive to light, neck supple, Cardiovascular: Normal rate, regular rhythm and normal heart sounds.  No murmur heard. Non pitting  BLE edema. Pulmonary/Chest: Effort normal and breath sounds normal. No respiratory distress. Abdominal: Soft.  There is no tenderness. Psychiatric: Patient has a normal mood and affect. behavior is normal. Judgment and thought content normal.    PHQ2/9:    04/20/2023   10:49 AM 04/02/2023   10:25 AM 03/25/2023    2:14 PM 03/08/2023    1:36 PM 03/04/2023    3:14 PM  Depression screen PHQ 2/9  Decreased Interest 0 0 0 0 0  Down, Depressed, Hopeless 0 0 0 0 0  PHQ - 2 Score 0 0 0 0 0  Altered sleeping 0  0 0 0  Tired, decreased energy 0  0 0 0  Change in appetite 0  0 0 0  Feeling bad or failure about yourself  0  0 0 0  Trouble concentrating 0  0 0 0  Moving slowly or fidgety/restless 0  0 0 0  Suicidal thoughts 0  0 0 0  PHQ-9 Score 0  0 0 0  Difficult doing work/chores   Not difficult at all Not difficult at all Not difficult at all    phq 9 is negative   Fall Risk:    04/20/2023   10:49 AM 04/02/2023   10:25 AM 03/25/2023    2:10 PM 03/08/2023    1:35 PM 03/04/2023    3:14 PM  Fall Risk   Falls in the  past  year? 1 0 0 0 0  Number falls in past yr: 0 0 0 0 0  Injury with Fall? 0 0 0 0 0  Risk for fall due to : Impaired balance/gait  No Fall Risks No Fall Risks No Fall Risks  Follow up Falls prevention discussed  Education provided;Falls prevention discussed Falls prevention discussed;Education provided;Falls evaluation completed Falls prevention discussed;Education provided;Falls evaluation completed      Functional Status Survey: Is the patient deaf or have difficulty hearing?: Yes Does the patient have difficulty seeing, even when wearing glasses/contacts?: No Does the patient have difficulty concentrating, remembering, or making decisions?: No Does the patient have difficulty walking or climbing stairs?: Yes Does the patient have difficulty dressing or bathing?: No Does the patient have difficulty doing errands alone such as visiting a doctor's office or shopping?: Yes    Assessment & Plan

## 2023-04-20 ENCOUNTER — Encounter: Payer: Self-pay | Admitting: Family Medicine

## 2023-04-20 ENCOUNTER — Ambulatory Visit: Payer: Medicare PPO | Admitting: Family Medicine

## 2023-04-20 VITALS — BP 134/70 | HR 74 | Resp 16 | Ht 62.0 in | Wt 149.0 lb

## 2023-04-20 DIAGNOSIS — I872 Venous insufficiency (chronic) (peripheral): Secondary | ICD-10-CM | POA: Diagnosis not present

## 2023-04-20 DIAGNOSIS — G44209 Tension-type headache, unspecified, not intractable: Secondary | ICD-10-CM

## 2023-04-20 DIAGNOSIS — R7303 Prediabetes: Secondary | ICD-10-CM

## 2023-04-20 DIAGNOSIS — Z862 Personal history of diseases of the blood and blood-forming organs and certain disorders involving the immune mechanism: Secondary | ICD-10-CM | POA: Diagnosis not present

## 2023-04-20 DIAGNOSIS — K219 Gastro-esophageal reflux disease without esophagitis: Secondary | ICD-10-CM | POA: Diagnosis not present

## 2023-04-20 DIAGNOSIS — M5412 Radiculopathy, cervical region: Secondary | ICD-10-CM

## 2023-04-20 DIAGNOSIS — F320 Major depressive disorder, single episode, mild: Secondary | ICD-10-CM | POA: Diagnosis not present

## 2023-04-20 DIAGNOSIS — M810 Age-related osteoporosis without current pathological fracture: Secondary | ICD-10-CM

## 2023-04-20 DIAGNOSIS — E559 Vitamin D deficiency, unspecified: Secondary | ICD-10-CM | POA: Diagnosis not present

## 2023-04-20 DIAGNOSIS — I890 Lymphedema, not elsewhere classified: Secondary | ICD-10-CM

## 2023-04-20 DIAGNOSIS — K5520 Angiodysplasia of colon without hemorrhage: Secondary | ICD-10-CM

## 2023-04-20 DIAGNOSIS — E611 Iron deficiency: Secondary | ICD-10-CM

## 2023-04-20 DIAGNOSIS — M545 Low back pain, unspecified: Secondary | ICD-10-CM

## 2023-04-20 MED ORDER — BUTALBITAL-APAP-CAFFEINE 50-325-40 MG PO TABS: 1.0000 | ORAL_TABLET | Freq: Four times a day (QID) | ORAL | 0 refills | Status: DC | PRN

## 2023-04-20 MED ORDER — FERROUS SULFATE 325 (65 FE) MG PO TABS
325.0000 mg | ORAL_TABLET | Freq: Every day | ORAL | 1 refills | Status: AC
Start: 2023-04-20 — End: ?

## 2023-04-23 DIAGNOSIS — Z961 Presence of intraocular lens: Secondary | ICD-10-CM | POA: Diagnosis not present

## 2023-04-23 DIAGNOSIS — H52203 Unspecified astigmatism, bilateral: Secondary | ICD-10-CM | POA: Diagnosis not present

## 2023-04-23 DIAGNOSIS — H02403 Unspecified ptosis of bilateral eyelids: Secondary | ICD-10-CM | POA: Diagnosis not present

## 2023-05-05 DIAGNOSIS — M810 Age-related osteoporosis without current pathological fracture: Secondary | ICD-10-CM | POA: Diagnosis not present

## 2023-05-05 DIAGNOSIS — K219 Gastro-esophageal reflux disease without esophagitis: Secondary | ICD-10-CM | POA: Diagnosis not present

## 2023-05-05 DIAGNOSIS — E7801 Familial hypercholesterolemia: Secondary | ICD-10-CM | POA: Diagnosis not present

## 2023-05-07 ENCOUNTER — Other Ambulatory Visit: Payer: Self-pay

## 2023-05-07 DIAGNOSIS — R238 Other skin changes: Secondary | ICD-10-CM

## 2023-05-07 MED ORDER — MUPIROCIN 2 % EX OINT
1.0000 | TOPICAL_OINTMENT | Freq: Every day | CUTANEOUS | 0 refills | Status: AC
Start: 2023-05-07 — End: ?

## 2023-05-07 NOTE — Progress Notes (Signed)
Refill request received from [facility]. Escripted

## 2023-05-12 DIAGNOSIS — G4489 Other headache syndrome: Secondary | ICD-10-CM | POA: Diagnosis not present

## 2023-05-12 DIAGNOSIS — M8000XA Age-related osteoporosis with current pathological fracture, unspecified site, initial encounter for fracture: Secondary | ICD-10-CM | POA: Diagnosis not present

## 2023-05-12 DIAGNOSIS — D508 Other iron deficiency anemias: Secondary | ICD-10-CM | POA: Diagnosis not present

## 2023-05-12 DIAGNOSIS — K2100 Gastro-esophageal reflux disease with esophagitis, without bleeding: Secondary | ICD-10-CM | POA: Diagnosis not present

## 2023-05-12 DIAGNOSIS — E7801 Familial hypercholesterolemia: Secondary | ICD-10-CM | POA: Diagnosis not present

## 2023-05-12 DIAGNOSIS — F418 Other specified anxiety disorders: Secondary | ICD-10-CM | POA: Diagnosis not present

## 2023-06-22 ENCOUNTER — Ambulatory Visit (INDEPENDENT_AMBULATORY_CARE_PROVIDER_SITE_OTHER): Payer: Medicare PPO | Admitting: Vascular Surgery

## 2023-06-24 ENCOUNTER — Ambulatory Visit: Payer: Medicare PPO | Admitting: Dermatology

## 2023-06-24 ENCOUNTER — Encounter: Payer: Self-pay | Admitting: Dermatology

## 2023-06-24 VITALS — BP 133/67 | HR 77

## 2023-06-24 DIAGNOSIS — L720 Epidermal cyst: Secondary | ICD-10-CM | POA: Diagnosis not present

## 2023-06-24 DIAGNOSIS — D1801 Hemangioma of skin and subcutaneous tissue: Secondary | ICD-10-CM

## 2023-06-24 DIAGNOSIS — Z86018 Personal history of other benign neoplasm: Secondary | ICD-10-CM

## 2023-06-24 DIAGNOSIS — I872 Venous insufficiency (chronic) (peripheral): Secondary | ICD-10-CM | POA: Diagnosis not present

## 2023-06-24 DIAGNOSIS — L821 Other seborrheic keratosis: Secondary | ICD-10-CM

## 2023-06-24 DIAGNOSIS — Z79899 Other long term (current) drug therapy: Secondary | ICD-10-CM

## 2023-06-24 DIAGNOSIS — Z1283 Encounter for screening for malignant neoplasm of skin: Secondary | ICD-10-CM

## 2023-06-24 DIAGNOSIS — L820 Inflamed seborrheic keratosis: Secondary | ICD-10-CM

## 2023-06-24 DIAGNOSIS — L578 Other skin changes due to chronic exposure to nonionizing radiation: Secondary | ICD-10-CM | POA: Diagnosis not present

## 2023-06-24 DIAGNOSIS — L814 Other melanin hyperpigmentation: Secondary | ICD-10-CM

## 2023-06-24 DIAGNOSIS — D229 Melanocytic nevi, unspecified: Secondary | ICD-10-CM

## 2023-06-24 DIAGNOSIS — W908XXA Exposure to other nonionizing radiation, initial encounter: Secondary | ICD-10-CM

## 2023-06-24 DIAGNOSIS — Z85820 Personal history of malignant melanoma of skin: Secondary | ICD-10-CM

## 2023-06-24 DIAGNOSIS — Z853 Personal history of malignant neoplasm of breast: Secondary | ICD-10-CM

## 2023-06-24 DIAGNOSIS — Z7189 Other specified counseling: Secondary | ICD-10-CM

## 2023-06-24 NOTE — Progress Notes (Signed)
Follow-Up Visit   Subjective  Christina Salas is a 89 y.o. female who presents for the following: Skin Cancer Screening and Full Body Skin Exam. HxMM, HxDN. Recheck bullae at right lower leg. Secondary to stasis dermatitis. Treated by Dr. Roseanne Reno 03/09/2023. I&D and mupirocin.  The patient presents for Total-Body Skin Exam (TBSE) for skin cancer screening and mole check. The patient has spots, moles and lesions to be evaluated, some may be new or changing and the patient has concerns that these could be cancer.  The following portions of the chart were reviewed this encounter and updated as appropriate: medications, allergies, medical history  Review of Systems:  No other skin or systemic complaints except as noted in HPI or Assessment and Plan.  Objective  Well appearing patient in no apparent distress; mood and affect are within normal limits.  A full examination was performed including scalp, head, eyes, ears, nose, lips, neck, chest, axillae, abdomen, back, buttocks, bilateral upper extremities, bilateral lower extremities, hands, feet, fingers, toes, fingernails, and toenails. All findings within normal limits unless otherwise noted below.   Relevant physical exam findings are noted in the Assessment and Plan.  Back x16 (16) Erythematous keratotic or waxy stuck-on papule or plaque.   Assessment & Plan   History of Melanoma - No evidence of recurrence today left crown scalp - No lymphadenopathy - Recommend regular full body skin exams - Recommend daily broad spectrum sunscreen SPF 30+ to sun-exposed areas, reapply every 2 hours as needed.  - Call if any new or changing lesions are noted between office visits   History of breast cancer Left Breast 1996 Clear today No lymphadenopathy  HISTORY OF DYSPLASTIC NEVI No evidence of recurrence today Recommend regular full body skin exams Recommend daily broad spectrum sunscreen SPF 30+ to sun-exposed areas, reapply every 2 hours as  needed.  Call if any new or changing lesions are noted between office visits   LENTIGINES, SEBORRHEIC KERATOSES, HEMANGIOMAS - Benign normal skin lesions - Benign-appearing - Call for any changes  MELANOCYTIC NEVI - Tan-brown and/or pink-flesh-colored symmetric macules and papules - Benign appearing on exam today - Observation - Call clinic for new or changing moles - Recommend daily use of broad spectrum spf 30+ sunscreen to sun-exposed areas.   ACTINIC DAMAGE - Chronic condition, secondary to cumulative UV/sun exposure - diffuse scaly erythematous macules with underlying dyspigmentation - Recommend daily broad spectrum sunscreen SPF 30+ to sun-exposed areas, reapply every 2 hours as needed.  - Staying in the shade or wearing long sleeves, sun glasses (UVA+UVB protection) and wide brim hats (4-inch brim around the entire circumference of the hat) are also recommended for sun protection.  - Call for new or changing lesions.  SKIN CANCER SCREENING PERFORMED TODAY.  STASIS DERMATITIS Exam: Erythematous, scaly patches involving the ankle and distal lower leg with associated lower leg edema. Chronic and persistent condition with duration or expected duration over one year. Condition is symptomatic/ bothersome to patient. Not currently at goal. Stasis in the legs causes chronic leg swelling, which may result in itchy or painful rashes, skin discoloration, skin texture changes, and sometimes ulceration.  Recommend daily graduated compression hose/stockings- easiest to put on first thing in morning, remove at bedtime.  Elevate legs as much as possible. Avoid salt/sodium rich foods. Treatment Plan: Use Triamcinolone cream twice daily up to 2 weeks as needed for redness and itching of the legs. Recommend graduated compression stockings at least knee-high wearing throughout the day can remove at  night and leave off at night. Keep scheduled appointment with vascular.  Patient is currently being  followed at vascular clinic.  Recommend she follow-up there for further evaluation and treatment.  Milia - tiny firm white papules - type of cyst - benign - may be extracted if symptomatic - observe  Inflamed seborrheic keratosis (16) Back x16  Symptomatic, irritating, patient would like treated.  Destruction of lesion - Back x16 Complexity: simple   Destruction method: cryotherapy   Informed consent: discussed and consent obtained   Timeout:  patient name, date of birth, surgical site, and procedure verified Lesion destroyed using liquid nitrogen: Yes   Region frozen until ice ball extended beyond lesion: Yes   Outcome: patient tolerated procedure well with no complications   Post-procedure details: wound care instructions given   Additional details:  Prior to procedure, discussed risks of blister formation, small wound, skin dyspigmentation, or rare scar following cryotherapy. Recommend Vaseline ointment to treated areas while healing.    Return in about 1 year (around 06/23/2024) for TBSE, HxMM.  I, Lawson Radar, CMA, am acting as scribe for Armida Sans, MD.  Documentation: I have reviewed the above documentation for accuracy and completeness, and I agree with the above.  Armida Sans, MD

## 2023-06-24 NOTE — Patient Instructions (Addendum)
LEGS:  Use Triamcinolone cream twice daily up to 2 weeks as needed.    Topical steroids (such as triamcinolone, fluocinolone, fluocinonide, mometasone, clobetasol, halobetasol, betamethasone, hydrocortisone) can cause thinning and lightening of the skin if they are used for too long in the same area. Your physician has selected the right strength medicine for your problem and area affected on the body. Please use your medication only as directed by your physician to prevent side effects.     Face: Aklief apply at bedtime to affected areas on face, wash off in morning.   Topical retinoid medications like tretinoin/Retin-A, adapalene/Differin, tazarotene/Fabior, and Epiduo/Epiduo Forte can cause dryness and irritation when first started. Only apply a pea-sized amount to the entire affected area. Avoid applying it around the eyes, edges of mouth and creases at the nose. If you experience irritation, use a good moisturizer first and/or apply the medicine less often. If you are doing well with the medicine, you can increase how often you use it until you are applying every night. Be careful with sun protection while using this medication as it can make you sensitive to the sun. This medicine should not be used by pregnant women.     Recommend daily broad spectrum sunscreen SPF 30+ to sun-exposed areas, reapply every 2 hours as needed. Call for new or changing lesions.  Staying in the shade or wearing long sleeves, sun glasses (UVA+UVB protection) and wide brim hats (4-inch brim around the entire circumference of the hat) are also recommended for sun protection.    Melanoma ABCDEs  Melanoma is the most dangerous type of skin cancer, and is the leading cause of death from skin disease.  You are more likely to develop melanoma if you: Have light-colored skin, light-colored eyes, or red or blond hair Spend a lot of time in the sun Tan regularly, either outdoors or in a tanning bed Have had  blistering sunburns, especially during childhood Have a close family member who has had a melanoma Have atypical moles or large birthmarks  Early detection of melanoma is key since treatment is typically straightforward and cure rates are extremely high if we catch it early.   The first sign of melanoma is often a change in a mole or a new dark spot.  The ABCDE system is a way of remembering the signs of melanoma.  A for asymmetry:  The two halves do not match. B for border:  The edges of the growth are irregular. C for color:  A mixture of colors are present instead of an even brown color. D for diameter:  Melanomas are usually (but not always) greater than 6mm - the size of a pencil eraser. E for evolution:  The spot keeps changing in size, shape, and color.  Please check your skin once per month between visits. You can use a small mirror in front and a large mirror behind you to keep an eye on the back side or your body.   If you see any new or changing lesions before your next follow-up, please call to schedule a visit.  Please continue daily skin protection including broad spectrum sunscreen SPF 30+ to sun-exposed areas, reapplying every 2 hours as needed when you're outdoors.   Staying in the shade or wearing long sleeves, sun glasses (UVA+UVB protection) and wide brim hats (4-inch brim around the entire circumference of the hat) are also recommended for sun protection.    Due to recent changes in healthcare laws, you may see results  of your pathology and/or laboratory studies on MyChart before the doctors have had a chance to review them. We understand that in some cases there may be results that are confusing or concerning to you. Please understand that not all results are received at the same time and often the doctors may need to interpret multiple results in order to provide you with the best plan of care or course of treatment. Therefore, we ask that you please give [place] 2 business  days to thoroughly review all your results before contacting the office for clarification. Should we see a critical lab result, you will be contacted sooner.   If You Need Anything After Your Visit  If you have any questions or concerns for your doctor, please call our main line at [phone] and press option 4 to reach your doctor's medical assistant. If no one answers, please leave a voicemail as directed and we will return your call as soon as possible. Messages left after 4 pm will be answered the following business day.   You may also send [place] a message via MyChart. We typically respond to MyChart messages within 1-2 business days.  For prescription refills, please ask your pharmacy to contact our office. Our fax number is [phone].  If you have an urgent issue when the clinic is closed that cannot wait until the next business day, you can page your doctor at the number below.    Please note that while we do our best to be available for urgent issues outside of office hours, we are not available 24/7.   If you have an urgent issue and are unable to reach [place], you may choose to seek medical care at your doctor's office, retail clinic, urgent care center, or emergency room.  If you have a medical emergency, please immediately call 911 or go to the emergency department.  Pager Numbers  - Dr. Gwen Pounds: [phone]  - Dr. Neale Burly: [phone]  - Dr. Roseanne Reno: [phone]  In the event of inclement weather, please call our main line at [phone] for an update on the status of any delays or closures.  Dermatology Medication Tips: Please keep the boxes that topical medications come in in order to help keep track of the instructions about where and how to use these. Pharmacies typically print the medication instructions only on the boxes and not directly on the medication tubes.   If your medication is too expensive, please contact our office at [phone] option 4 or send [place] a  message through MyChart.   We are unable to tell what your co-pay for medications will be in advance as this is different depending on your insurance coverage. However, we may be able to find a substitute medication at lower cost or fill out paperwork to get insurance to cover a needed medication.   If a prior authorization is required to get your medication covered by your insurance company, please allow [place] 1-2 business days to complete this process.  Drug prices often vary depending on where the prescription is filled and some pharmacies may offer cheaper prices.  The website www.goodrx.com contains coupons for medications through different pharmacies. The prices here do not account for what the cost may be with help from insurance (it may be cheaper with your insurance), but the website can give you the price if you did not use any insurance.  - You can print the associated coupon and take it with your prescription to the pharmacy.  - You may also  stop by our office during regular business hours and pick up a GoodRx coupon card.  - If you need your prescription sent electronically to a different pharmacy, notify our office through [facility] or by phone at [phone] option 4.     Si Usted Necesita Algo Despus de Su Visita  Tambin puede enviarnos un mensaje a travs de Clinical cytogeneticist. Por lo general respondemos a los mensajes de MyChart en el transcurso de 1 a 2 das hbiles.  Para renovar recetas, por favor pida a su farmacia que se ponga en contacto con nuestra oficina. Annie Sable de fax es [place] [phone].  Si tiene un asunto urgente cuando la clnica est cerrada y que no puede esperar hasta el siguiente da hbil, puede llamar/localizar a su doctor(a) al nmero que aparece a continuacin.   Por favor, tenga en cuenta que aunque hacemos todo lo posible para estar disponibles para asuntos urgentes fuera del horario de [place], no estamos disponibles las 24 horas del da, los 7  [address]  [address] de la [place].   Si tiene un problema urgente y no puede comunicarse con nosotros, puede optar por buscar atencin mdica  en el consultorio de su doctor(a), en una clnica privada, en un centro de atencin urgente o en una sala de emergencias.  Si tiene Engineer, drilling, por favor llame inmediatamente al 911 o vaya a la sala de emergencias.  Nmeros de bper  - Dr. Gwen Pounds: [phone]  - Dra. Moye: [phone]  - Dra. Roseanne Reno: [phone]  En caso de inclemencias del [place], por favor llame a Lacy Duverney principal al [phone] para una actualizacin sobre el [place] de cualquier retraso o cierre.  Consejos para la medicacin en dermatologa: Por favor, guarde las cajas en las que vienen los medicamentos de uso tpico para ayudarle a seguir las instrucciones sobre dnde y cmo usarlos. Las farmacias generalmente imprimen las instrucciones del medicamento slo en las cajas y no directamente en los tubos del [place].   Si su medicamento es muy caro, por favor, pngase en contacto con Rolm Gala llamando al [phone] y presione la opcin 4 o envenos un mensaje a travs de Clinical cytogeneticist.   No podemos decirle cul ser su copago por los medicamentos por adelantado ya que esto es diferente dependiendo de la cobertura de su seguro. Sin embargo, es posible que podamos encontrar un medicamento sustituto a Audiological scientist un formulario para que el seguro cubra el medicamento que se considera necesario.   Si se requiere una autorizacin previa para que su compaa de seguros [place] su medicamento, por favor permtanos de 1 a 2 das hbiles para completar [address]  Los precios de los medicamentos varan con frecuencia dependiendo del Environmental consultant de dnde se surte la receta y alguna farmacias pueden ofrecer precios ms baratos.  El sitio web www.goodrx.com tiene cupones para medicamentos de Health and safety inspector. Los precios aqu no tienen en cuenta lo que podra costar con  la ayuda del seguro (puede ser ms barato con su seguro), pero el sitio web puede darle el precio si no utiliz Tourist information centre manager.  - Puede imprimir el cupn correspondiente y llevarlo con su receta a la farmacia.  - Tambin puede pasar por nuestra oficina durante el horario de atencin regular y Education officer, museum una tarjeta de cupones de GoodRx.  - Si necesita que su receta se enve electrnicamente a Psychiatrist, informe a nuestra oficina a travs de MyChart de [place] o por telfono llamando al [phone] y presione la opcin  4.  

## 2023-06-25 ENCOUNTER — Encounter: Payer: Self-pay | Admitting: Dermatology

## 2023-07-13 ENCOUNTER — Ambulatory Visit (INDEPENDENT_AMBULATORY_CARE_PROVIDER_SITE_OTHER): Payer: Medicare PPO | Admitting: Vascular Surgery

## 2023-07-13 ENCOUNTER — Encounter (INDEPENDENT_AMBULATORY_CARE_PROVIDER_SITE_OTHER): Payer: Self-pay | Admitting: Vascular Surgery

## 2023-07-13 VITALS — BP 158/83 | HR 87 | Resp 15 | Wt 142.2 lb

## 2023-07-13 DIAGNOSIS — E7849 Other hyperlipidemia: Secondary | ICD-10-CM | POA: Diagnosis not present

## 2023-07-13 DIAGNOSIS — I83893 Varicose veins of bilateral lower extremities with other complications: Secondary | ICD-10-CM

## 2023-07-13 DIAGNOSIS — I890 Lymphedema, not elsewhere classified: Secondary | ICD-10-CM | POA: Insufficient documentation

## 2023-07-13 NOTE — Assessment & Plan Note (Signed)
Symptom control much better with several months in compression socks after several weeks and Unna boots.  Continue current medical regimens.  Patient would clearly benefit from a lymphedema pump as she has developed at least stage II lymphedema with hyperpigmentation and scarring and lymphatic channels from her chronic swelling.  We will try to get that obtained and follow-up with her in several months.

## 2023-07-13 NOTE — Progress Notes (Signed)
MRN : [id number]  Christina Salas is a 89 y.o. (July 07, 1933) female who presents with chief complaint of  Chief Complaint  Patient presents with   Follow-up    3 month follow up  .  History of Present Illness: Patient returns today in follow up of her leg swelling and ulceration.  Her skin has gotten much better.  She still has some hyperpigmentation and stasis dermatitis changes particular in the left medial lower leg but this is markedly improved.  She was in [employer] for several weeks and that seemed to do a great job of getting this process going.  Her swelling is better and stable.  She has been active and elevating her legs.  She is wearing compression socks daily.  Current Outpatient Medications  Medication Sig Dispense Refill   butalbital-acetaminophen-caffeine (FIORICET) 50-325-40 MG tablet Take 1 tablet by mouth every 6 (six) hours as needed. 60 tablet 0   Calcium-Magnesium 500-250 MG TABS Take 1 tablet by mouth 2 (two) times daily.     CINNAMON PO Take 1 capsule by mouth daily. 350 mg     Cranberry 500 MG CAPS Take 1 tablet by mouth daily.     esomeprazole (NEXIUM) 20 MG capsule Take 1 capsule (20 mg total) by mouth daily. 90 capsule 1   ferrous sulfate (FEROSUL) 325 (65 FE) MG tablet Take 1 tablet (325 mg total) by mouth daily with breakfast. 90 tablet 1   Misc Natural Products (LEG VEIN & CIRCULATION) TABS Take 2 capsules by mouth daily.     Multiple Vitamins-Calcium (ESSENTIAL ONE DAILY MULTIVIT) TABS Take by mouth.     mupirocin ointment (BACTROBAN) 2 % Apply 1 Application topically daily. 22 g 0   Omega-3 Fatty Acids (FISH OIL) 1000 MG CAPS Take by mouth.     triamcinolone cream (KENALOG) 0.1 % Apply topically to lower legs 3-5 days a week for rash. Avoid applying to face, groin, and axilla. Use as directed. 80 g 3   No current facility-administered medications for this visit.    Past Medical History:  Diagnosis Date   Abnormal glucose    Anxiety    Arthritis     Breast cancer (HCC) 1996   Left- mastectomy   Cancer (HCC)    melanoma   Chronic headaches    Depression    Dysplastic nevus 05/11/2007   right upper gastric area. Slight to moderate atypia.   Dysplastic nevus 06/24/2020   R lateral deltoid, moderate to severe atypia. Excised 08/13/2020, margins free.   GERD (gastroesophageal reflux disease)    H/O melanoma excision    History of diverticulitis    History of recurrent UTIs    Hypertension    Melanoma (HCC)    scalp   Osteoporosis    pre-osetoporosis per patient history form 03/18/12   Pre-diabetes    Vitamin D deficiency     Past Surgical History:  Procedure Laterality Date   ABDOMINAL HYSTERECTOMY  2001   BREAST BIOPSY  2010   BREAST EXCISIONAL BIOPSY Right 1997   Benign excisional biopsy    CATARACT EXTRACTION Left 01/30/2021   Dr. Maris Berger   COLONOSCOPY  04/02/2014   COLONOSCOPY WITH PROPOFOL N/A 12/27/2018   Procedure: COLONOSCOPY WITH PROPOFOL;  Surgeon: Midge Minium, MD;  [facility] ENDOSCOPY;  Service: Endoscopy;  Laterality: N/A;   CYSTOSCOPY  11/2014   ESOPHAGOGASTRODUODENOSCOPY (EGD) WITH PROPOFOL N/A 12/27/2018   Procedure: ESOPHAGOGASTRODUODENOSCOPY (EGD) WITH PROPOFOL;  Surgeon: Midge Minium, MD;  Location:  [facility] ENDOSCOPY;  Service: Endoscopy;  Laterality: N/A;   ESOPHAGOGASTRODUODENOSCOPY (EGD) WITH PROPOFOL N/A 01/07/2021   Procedure: ESOPHAGOGASTRODUODENOSCOPY (EGD) WITH PROPOFOL;  Surgeon: Midge Minium, MD;  Location: [facility] ENDOSCOPY;  Service: Endoscopy;  Laterality: N/A;   GIVENS CAPSULE STUDY N/A 01/07/2021   Procedure: GIVENS CAPSULE STUDY;  Surgeon: Midge Minium, MD;  Location[facility] ENDOSCOPY;  Service: Endoscopy;  Laterality: N/A;   MASTECTOMY  1996   Left   MELANOMA EXCISION  1995   Head     Social History   Tobacco Use   Smoking status: Never   Smokeless tobacco: Never  Vaping Use   Vaping status: Never Used  Substance Use Topics   Alcohol use: No    Alcohol/week: 0.0  standard drinks of alcohol   Drug use: Never       Family History  Problem Relation Age of Onset   Breast cancer Mother    Migraines Mother    Diabetes Father    CAD Father    Thrombosis Father    Aortic aneurysm Sister    Biliary Cirrhosis Brother    Breast cancer Other        Niece     No Known Allergies   REVIEW OF SYSTEMS (Negative unless checked)  Constitutional: [] Weight loss  [] Fever  [] Chills Cardiac: [] Chest pain   [] Chest pressure   [] Palpitations   [] Shortness of breath when laying flat   [] Shortness of breath at rest   [] Shortness of breath with exertion. Vascular:  [] Pain in legs with walking   [] Pain in legs at rest   [] Pain in legs when laying flat   [] Claudication   [] Pain in feet when walking  [] Pain in feet at rest  [] Pain in feet when laying flat   [] History of DVT   [] Phlebitis   [x] Swelling in legs   [x] Varicose veins   [] Non-healing ulcers Pulmonary:   [] Uses home oxygen   [] Productive cough   [] Hemoptysis   [] Wheeze  [] COPD   [] Asthma Neurologic:  [] Dizziness  [] Blackouts   [] Seizures   [] History of stroke   [] History of TIA  [] Aphasia   [] Temporary blindness   [] Dysphagia   [] Weakness or numbness in arms   [] Weakness or numbness in legs Musculoskeletal:  [x] Arthritis   [] Joint swelling   [x] Joint pain   [] Low back pain Hematologic:  [] Easy bruising  [] Easy bleeding   [] Hypercoagulable state   [] Anemic   Gastrointestinal:  [] Blood in stool   [] Vomiting blood  [x] Gastroesophageal reflux/heartburn   [] Abdominal pain Genitourinary:  [] Chronic kidney disease   [] Difficult urination  [] Frequent urination  [] Burning with urination   [] Hematuria Skin:  [] Rashes   [x] Ulcers   [x] Wounds Psychological:  [x] History of anxiety   [x]  History of major depression.  Physical Examination  BP (!) 158/83 (BP Location: Left Arm)   Pulse 87   Resp 15   Wt 142 lb 3.2 oz (64.5 kg)   BMI 26.01 kg/m  Gen:  WD/WN, NAD. Appears younger than stated age. Head: [place]/AT, No  temporalis wasting. Ear/Nose/Throat: Hearing grossly intact, nares w/o erythema or drainage Eyes: Conjunctiva clear. Sclera non-icteric Neck: Supple.  Trachea midline Pulmonary:  Good air movement, no use of accessory muscles.  Cardiac: irregular Vascular:  Vessel Right Left  Radial Palpable Palpable               Musculoskeletal: M/S 5/5 throughout.  No deformity or atrophy. 1+ BLE edema. Moderate stasis dermatitis, more on the left than the right. Neurologic: Sensation  grossly intact in extremities.  Symmetrical.  Speech is fluent.  Psychiatric: Judgment intact, Mood & affect appropriate for pt's clinical situation. Dermatologic: No rashes or ulcers noted.  No cellulitis or open wounds.      Labs No results found for this or any previous visit (from the past 2160 hour(s)).  Radiology No results found.  Assessment/Plan  Varicose veins of leg with swelling, bilateral Symptom control much better with several months in compression socks after several weeks and Unna boots.  Continue current medical regimens.  Patient would clearly benefit from a lymphedema pump as she has developed at least stage II lymphedema with hyperpigmentation and scarring and lymphatic channels from her chronic swelling.  We will try to get that obtained and follow-up with her in several months.  Lymphedema The patient has stage II lymphedema with marked hyperpigmentation, swelling refractory to conservative measures such as compression and elevation, which she has been doing now for many months.  She required several weeks of Unna boots earlier this year for skin breakdown.  Her skin is currently intact but she still has noticeable swelling.  Continue all of her conservative measures, but attempt to get a lymphedema pump for adjuvant therapy to help her swelling and pain.  HLD (hyperlipidemia) lipid control important in reducing the progression of atherosclerotic disease.     Festus Barren,  MD  07/13/2023 2:13 PM    This note was created with Dragon medical transcription system.  Any errors from dictation are purely unintentional

## 2023-07-13 NOTE — Assessment & Plan Note (Signed)
 lipid control important in reducing the progression of atherosclerotic disease.   

## 2023-07-13 NOTE — Assessment & Plan Note (Signed)
The patient has stage II lymphedema with marked hyperpigmentation, swelling refractory to conservative measures such as compression and elevation, which she has been doing now for many months.  She required several weeks of Unna boots earlier this year for skin breakdown.  Her skin is currently intact but she still has noticeable swelling.  Continue all of her conservative measures, but attempt to get a lymphedema pump for adjuvant therapy to help her swelling and pain.

## 2023-07-16 DIAGNOSIS — M79675 Pain in left toe(s): Secondary | ICD-10-CM | POA: Diagnosis not present

## 2023-07-16 DIAGNOSIS — L851 Acquired keratosis [keratoderma] palmaris et plantaris: Secondary | ICD-10-CM | POA: Diagnosis not present

## 2023-07-16 DIAGNOSIS — B351 Tinea unguium: Secondary | ICD-10-CM | POA: Diagnosis not present

## 2023-07-16 DIAGNOSIS — M79674 Pain in right toe(s): Secondary | ICD-10-CM | POA: Diagnosis not present

## 2023-07-20 DIAGNOSIS — G4489 Other headache syndrome: Secondary | ICD-10-CM | POA: Diagnosis not present

## 2023-07-20 DIAGNOSIS — F418 Other specified anxiety disorders: Secondary | ICD-10-CM | POA: Diagnosis not present

## 2023-07-20 DIAGNOSIS — E7801 Familial hypercholesterolemia: Secondary | ICD-10-CM | POA: Diagnosis not present

## 2023-07-20 DIAGNOSIS — M8008XA Age-related osteoporosis with current pathological fracture, vertebra(e), initial encounter for fracture: Secondary | ICD-10-CM | POA: Diagnosis not present

## 2023-07-20 DIAGNOSIS — M8000XA Age-related osteoporosis with current pathological fracture, unspecified site, initial encounter for fracture: Secondary | ICD-10-CM | POA: Diagnosis not present

## 2023-07-20 DIAGNOSIS — D508 Other iron deficiency anemias: Secondary | ICD-10-CM | POA: Diagnosis not present

## 2023-07-20 DIAGNOSIS — K2100 Gastro-esophageal reflux disease with esophagitis, without bleeding: Secondary | ICD-10-CM | POA: Diagnosis not present

## 2023-08-09 ENCOUNTER — Other Ambulatory Visit: Payer: Self-pay | Admitting: Family Medicine

## 2023-08-09 DIAGNOSIS — K219 Gastro-esophageal reflux disease without esophagitis: Secondary | ICD-10-CM

## 2023-08-12 DIAGNOSIS — N3000 Acute cystitis without hematuria: Secondary | ICD-10-CM | POA: Diagnosis not present

## 2023-08-12 DIAGNOSIS — R8271 Bacteriuria: Secondary | ICD-10-CM | POA: Diagnosis not present

## 2023-08-12 DIAGNOSIS — R3121 Asymptomatic microscopic hematuria: Secondary | ICD-10-CM | POA: Diagnosis not present

## 2023-08-12 DIAGNOSIS — N3946 Mixed incontinence: Secondary | ICD-10-CM | POA: Diagnosis not present

## 2023-08-26 ENCOUNTER — Other Ambulatory Visit: Payer: Self-pay | Admitting: Family Medicine

## 2023-08-26 DIAGNOSIS — Z1231 Encounter for screening mammogram for malignant neoplasm of breast: Secondary | ICD-10-CM

## 2023-09-13 ENCOUNTER — Telehealth: Payer: Self-pay | Admitting: Family Medicine

## 2023-09-13 NOTE — Telephone Encounter (Signed)
Copied from CRM [id number]. Topic: Appointment Scheduling - Scheduling Inquiry for Clinic >> Sep 13, 2023  8:18 AM Everette C wrote: Reason for CRM: The patient would like to be contacted by a member of clinical staff to discuss scheduling a vaccination appt  Please contact the patient further when possible

## 2023-09-13 NOTE — Telephone Encounter (Signed)
Patient inquiring about getting COVID booster. Advised we do not have in clinic, but she can go to the pharmacy for it. Patient verbalized understanding.

## 2023-09-17 ENCOUNTER — Ambulatory Visit
Admission: RE | Admit: 2023-09-17 | Discharge: 2023-09-17 | Disposition: A | Discharge status: Home / Self Care | Payer: Medicare PPO | Source: Ambulatory Visit | Attending: Family Medicine | Admitting: Family Medicine

## 2023-09-17 DIAGNOSIS — Z1231 Encounter for screening mammogram for malignant neoplasm of breast: Secondary | ICD-10-CM | POA: Insufficient documentation

## 2023-10-19 ENCOUNTER — Telehealth: Payer: Self-pay | Admitting: Family Medicine

## 2023-10-19 NOTE — Progress Notes (Deleted)
Name: Christina Salas   MRN: [id number]    DOB: 07-15-1933   Date:10/19/2023       Progress Note  Subjective  Chief Complaint  Follow up  HPI  Cervical Radiculopathy: she has decrease rom of neck and  occasionally tingling down to right shoulder but not lately.  She states her neck is causing problems because she can not look up to walk, she had one session of PT but has not been back, she will contact them to re-schedule it    Tension headache: she is taking fioricet prn and needs a refill, she describes headaches as dull and or sharp, not as frequent now, she states baclofen also helps when she has a headache but causes drowsiness . Usually frontal, very seldom she has associated  nausea or vomiting. She has been taking Fioricet more often, discussed controlled medication and to last 6 months from now on   Iron deficiency:She was seen by Dr. Lars Pinks back in 11/2018, had EGD and colonoscopy , she also had multiple AVM of small bowel , Dr. Lars Pinks discussed either push enteroscopy versus double balloon enteroscopy, or continue monitoring for anemia and take iron supplementation. She chose the later and last levels were back to normal, however iron storage dropping and she recently stopped taking iron, advised to take it at least three times a week    Post-menopausal osteoporosis : she used to take Fosamax but stopped going due to having dental work done.  She is getting Prolia now with Dr. Patrecia Pace    Mild Major Depression: she has long history of depression, she states not feeling great right now, states life is boring, denies suicidal thoughts or ideation, but has lack of energy and also lack of appetite She states her nephew is 31 yo and diagnosed with cancer and he is worried about him   Pre-diabetes: she denies polyphagia or polyuria, she feels her mouth gets dry at times. Last A1C was normal, weight is stable now    GERD: controlled with Nexium at this time, she is aware of long term side  effects. She denies heart burn or indigestion . She is under the care of Dr. Servando Snare .  Unchanged   Malnutrition: she lost 20 lbs from pre-pandemic to 2021, baseline weight was 170 lbs but now in the mid 140's , she does not want to gain any more weight.   Angina: she sees Dr. Juliann Pares yearly. She states only has symptoms occasionally. Stable   Echo from 2017  NORMAL LEFT VENTRICULAR SYSTOLIC FUNCTION WITH AN ESTIMATED EF = >55 %  NORMAL RIGHT VENTRICULAR SYSTOLIC FUNCTION  MILD TRICUSPID AND MITRAL VALVE INSUFFICIENCY  TRACE AORTIC VALVE INSUFFICIENCY  NO VALVULAR STENOSIS  MILD BIATRIAL ENLARGEMENT   ECG Interpretation: 2017  Rest ECG:  normal sinus rhythm, none  Stress ECG:  normal sinus rhythm,  Recovery ECG:  normal sinus rhythm  ECG Interpretation:  non-diagnostic due to pharmacologic testing  Neuropathy: she states sometimes burning and tingling numbness she tried topical medications, she states pain is not as severe now .Stable   Vitamin D :she is taking 2000 units daily  and last level was at goal , continue supplementation   Recurrent UTI: she was seen by Della Goo NP a few weeks ago and treated with Septra, she had symptoms again last week and called Dr. Lawerance Bach , he sent in antibiotics - Macrobid with refills. She states symptoms resolved.  Patient Active Problem List   Diagnosis Date Noted  Lymphedema 07/13/2023   Major depressive disorder, single episode, mild (HCC) 04/20/2023   Menopausal osteoporosis 10/16/2022   History of iron deficiency anemia 10/16/2022   AVM (arteriovenous malformation) of small bowel, acquired 10/16/2022   Neuropathy 10/16/2022   Stricture and stenosis of esophagus    Large hiatal hernia 01/24/2019   Internal hemorrhoids 01/24/2019   Iron deficiency anemia due to chronic blood loss    Venous reflux 04/19/2018   Prediabetes 06/08/2016   Obesity (BMI 30.0-34.9) 06/08/2016   Vitamin D deficiency 06/08/2016   Post-menopausal 12/11/2015    DD (diverticular disease) 06/19/2015   Esophageal reflux 06/19/2015   HLD (hyperlipidemia) 06/19/2015   Varicose veins of leg with swelling, bilateral 06/19/2015   Recurrent UTI 02/08/2013   Mixed incontinence 02/08/2013   History of breast cancer, left, T1, N0, Left MRM - Dec 1996 03/18/2012   Tension headache 05/24/2007   History of melanoma 08/28/1994    Past Surgical History:  Procedure Laterality Date   ABDOMINAL HYSTERECTOMY  2001   BREAST BIOPSY  2010   BREAST EXCISIONAL BIOPSY Right 1997   Benign excisional biopsy    CATARACT EXTRACTION Left 01/30/2021   Dr. Maris Berger   COLONOSCOPY  04/02/2014   COLONOSCOPY WITH PROPOFOL N/A 12/27/2018   Procedure: COLONOSCOPY WITH PROPOFOL;  Surgeon: Midge Minium, MD;  Location: [facility] ENDOSCOPY;  Service: Endoscopy;  Laterality: N/A;   CYSTOSCOPY  11/2014   ESOPHAGOGASTRODUODENOSCOPY (EGD) WITH PROPOFOL N/A 12/27/2018   Procedure: ESOPHAGOGASTRODUODENOSCOPY (EGD) WITH PROPOFOL;  Surgeon: Midge Minium, MD;  Location: [facility] ENDOSCOPY;  Service: Endoscopy;  Laterality: N/A;   ESOPHAGOGASTRODUODENOSCOPY (EGD) WITH PROPOFOL N/A 01/07/2021   Procedure: ESOPHAGOGASTRODUODENOSCOPY (EGD) WITH PROPOFOL;  Surgeon: Midge Minium, MD;  Location: [facility] ENDOSCOPY;  Service: Endoscopy;  Laterality: N/A;   GIVENS CAPSULE STUDY N/A 01/07/2021   Procedure: GIVENS CAPSULE STUDY;  Surgeon: Midge Minium, MD;  Location: [facility] ENDOSCOPY;  Service: Endoscopy;  Laterality: N/A;   MASTECTOMY  1996   Left   MELANOMA EXCISION  1995   Head    Family History  Problem Relation Age of Onset   Breast cancer Mother    Migraines Mother    Diabetes Father    CAD Father    Thrombosis Father    Aortic aneurysm Sister    Biliary Cirrhosis Brother    Breast cancer Other        Niece    Social History   Tobacco Use   Smoking status: Never   Smokeless tobacco: Never  Substance Use Topics   Alcohol use: No    Alcohol/week: 0.0 standard drinks of alcohol      Current Outpatient Medications:    butalbital-acetaminophen-caffeine (FIORICET) 50-325-40 MG tablet, Take 1 tablet by mouth every 6 (six) hours as needed., Disp: 60 tablet, Rfl: 0   Calcium-Magnesium 500-250 MG TABS, Take 1 tablet by mouth 2 (two) times daily., Disp: , Rfl:    CINNAMON PO, Take 1 capsule by mouth daily. 350 mg, Disp: , Rfl:    Cranberry 500 MG CAPS, Take 1 tablet by mouth daily., Disp: , Rfl:    esomeprazole (NEXIUM) 20 MG capsule, TAKE 1 CAPSULE(20 MG) BY MOUTH DAILY, Disp: 90 capsule, Rfl: 0   ferrous sulfate (FEROSUL) 325 (65 FE) MG tablet, Take 1 tablet (325 mg total) by mouth daily with breakfast., Disp: 90 tablet, Rfl: 1   Misc Natural Products (LEG VEIN & CIRCULATION) TABS, Take 2 capsules by mouth daily., Disp: , Rfl:    Multiple Vitamins-Calcium (ESSENTIAL ONE DAILY  MULTIVIT) TABS, Take by mouth., Disp: , Rfl:    mupirocin ointment (BACTROBAN) 2 %, Apply 1 Application topically daily., Disp: 22 g, Rfl: 0   Omega-3 Fatty Acids (FISH OIL) 1000 MG CAPS, Take by mouth., Disp: , Rfl:    triamcinolone cream (KENALOG) 0.1 %, Apply topically to lower legs 3-5 days a week for rash. Avoid applying to face, groin, and axilla. Use as directed., Disp: 80 g, Rfl: 3  No Known Allergies  I personally reviewed {Reviewed:[id number]} with the patient/caregiver today.   ROS  ***  Objective  There were no vitals filed for this visit.  There is no height or weight on file to calculate BMI.  Physical Exam ***  No results found for this or any previous visit (from the past 2160 hour(s)).  Diabetic Foot Exam: Diabetic Foot Exam - Simple   No data filed    ***  PHQ2/9:    04/20/2023   10:49 AM 04/02/2023   10:25 AM 03/25/2023    2:14 PM 03/08/2023    1:36 PM 03/04/2023    3:14 PM  Depression screen PHQ 2/9  Decreased Interest 0 0 0 0 0  Down, Depressed, Hopeless 0 0 0 0 0  PHQ - 2 Score 0 0 0 0 0  Altered sleeping 0  0 0 0  Tired, decreased energy 0  0 0 0  Change in  appetite 0  0 0 0  Feeling bad or failure about yourself  0  0 0 0  Trouble concentrating 0  0 0 0  Moving slowly or fidgety/restless 0  0 0 0  Suicidal thoughts 0  0 0 0  PHQ-9 Score 0  0 0 0  Difficult doing work/chores   Not difficult at all Not difficult at all Not difficult at all    phq 9 is {gen pos ZOX:[id number]} ***  Fall Risk:    04/20/2023   10:49 AM 04/02/2023   10:25 AM 03/25/2023    2:10 PM 03/08/2023    1:35 PM 03/04/2023    3:14 PM  Fall Risk   Falls in the past year? 1 0 0 0 0  Number falls in past yr: 0 0 0 0 0  Injury with Fall? 0 0 0 0 0  Risk for fall due to : Impaired balance/gait  No Fall Risks No Fall Risks No Fall Risks  Follow up Falls prevention discussed  Education provided;Falls prevention discussed Falls prevention discussed;Education provided;Falls evaluation completed Falls prevention discussed;Education provided;Falls evaluation completed   ***   Functional Status Survey:   ***   Assessment & Plan  *** There are no diagnoses linked to this encounter.

## 2023-10-20 ENCOUNTER — Ambulatory Visit: Payer: Medicare PPO | Admitting: Family Medicine

## 2023-10-21 NOTE — Progress Notes (Signed)
Name: Christina Salas   MRN: [id number]    DOB: [birth year]   Date:10/22/2023       Progress Note  Subjective  Chief Complaint  Follow up  HPI  Cervical Radiculopathy: she has decrease rom of neck and  occasionally tingling down to right shoulder  but not lately.   She states her neck is causing problems because she can not look up to walk, she had one session of PT but has not been back, she asked me to refer her to Ortho. She wants Ortho to tell her what PT to do . She will let me know the name of Ortho she would like to see   Tension headache: she is taking fioricet prn and needs a refill, she describes headaches as dull and or sharp, not as frequent now, she states baclofen also helps when she has a headache but causes drowsiness . Usually frontal, very seldom she has associated  nausea or vomiting. She has been taking Fioricet prn, she still takes tylenol daily , discussed avoiding daily pain medication due to rebound headache   Iron deficiency:She was seen by Dr. Lars Pinks back in 11/2018, had EGD and colonoscopy , she also had multiple AVM of small bowel , Dr. Lars Pinks discussed either push enteroscopy versus double balloon enteroscopy, or continue monitoring for anemia and take iron supplementation. She has not been consistent with iron supplementation but she said she will start taking it daily again    Post-menopausal osteoporosis : she used to take Fosamax but stopped going due to having dental work done.  She had two shots of  Prolia , given by Dr. Patrecia Pace. She states had some pain on left jaw, saw dentist and is getting better, she is afraid to resume medication. We will recheck bone density , she does not want referral to Endo at this time  Mild Major Depression: she has long history of depression, she states not feeling great right now, states life is boring, denies suicidal thoughts or ideation, but has lack of energy and also lack of appetite. She lost a lot of weight since the pandemic  and has not gained it back  Pre-diabetes: she denies polyphagia or polyuria, she feels her mouth gets dry at times.  Since she lost weight A1C has improved    GERD: controlled with Nexium at this time, she is aware of long term side effects. She denies heart burn or indigestion . She is under the care of Dr. Servando Snare .  Stable   Malnutrition: she lost 20 + lbs from pre-pandemic to 2021, baseline weight was 170 lbs but now in the mid 140's , she does not want to gain any more weight.   Angina: she sees Dr. Juliann Pares yearly. She states only has symptoms are occasional now . Does not take medications   Echo from 2017  NORMAL LEFT VENTRICULAR SYSTOLIC FUNCTION WITH AN ESTIMATED EF = >55 %  NORMAL RIGHT VENTRICULAR SYSTOLIC FUNCTION  MILD TRICUSPID AND MITRAL VALVE INSUFFICIENCY  TRACE AORTIC VALVE INSUFFICIENCY  NO VALVULAR STENOSIS  MILD BIATRIAL ENLARGEMENT   ECG Interpretation: 2017  Rest ECG:  normal sinus rhythm, none  Stress ECG:  normal sinus rhythm,  Recovery ECG:  normal sinus rhythm  ECG Interpretation:  non-diagnostic due to pharmacologic testing  Neuropathy: she states sometimes burning and tingling numbness she tried topical medications, she states her legs are improving   Vitamin D :she is taking 2000 units daily  and last level was at goal ,  continue supplementation   Lymphedema: seen by Dr. Wyn Quaker and wearing compression stocking hoses and also using a walking pad that seems to also help with symptoms  Patient Active Problem List   Diagnosis Date Noted   Lymphedema 07/13/2023   Major depressive disorder, single episode, mild (HCC) 04/20/2023   Menopausal osteoporosis 10/16/2022   History of iron deficiency anemia 10/16/2022   AVM (arteriovenous malformation) of small bowel, acquired 10/16/2022   Neuropathy 10/16/2022   Stricture and stenosis of esophagus    Large hiatal hernia 01/24/2019   Internal hemorrhoids 01/24/2019   Iron deficiency anemia due to chronic blood loss     Venous reflux 04/19/2018   Prediabetes 06/08/2016   Obesity (BMI 30.0-34.9) 06/08/2016   Vitamin D deficiency 06/08/2016   Post-menopausal 12/11/2015   DD (diverticular disease) 06/19/2015   Esophageal reflux 06/19/2015   HLD (hyperlipidemia) 06/19/2015   Varicose veins of leg with swelling, bilateral 06/19/2015   Recurrent UTI 02/08/2013   Mixed incontinence 02/08/2013   History of breast cancer, left, T1, N0, Left MRM - Dec 1996 03/18/2012   Tension headache 05/24/2007   History of melanoma 08/28/1994    Past Surgical History:  Procedure Laterality Date   ABDOMINAL HYSTERECTOMY  2001   BREAST BIOPSY  2010   BREAST EXCISIONAL BIOPSY Right 1997   Benign excisional biopsy    CATARACT EXTRACTION Left 01/30/2021   Dr. Maris Berger   COLONOSCOPY  04/02/2014   COLONOSCOPY WITH PROPOFOL N/A 12/27/2018   Procedure: COLONOSCOPY WITH PROPOFOL;  Surgeon: Midge Minium, MD;  Location: [facility] ENDOSCOPY;  Service: Endoscopy;  Laterality: N/A;   CYSTOSCOPY  11/2014   ESOPHAGOGASTRODUODENOSCOPY (EGD) WITH PROPOFOL N/A 12/27/2018   Procedure: ESOPHAGOGASTRODUODENOSCOPY (EGD) WITH PROPOFOL;  Surgeon: Midge Minium, MD;  Location: [facility] ENDOSCOPY;  Service: Endoscopy;  Laterality: N/A;   ESOPHAGOGASTRODUODENOSCOPY (EGD) WITH PROPOFOL N/A 01/07/2021   Procedure: ESOPHAGOGASTRODUODENOSCOPY (EGD) WITH PROPOFOL;  Surgeon: Midge Minium, MD;  Location: [facility] ENDOSCOPY;  Service: Endoscopy;  Laterality: N/A;   GIVENS CAPSULE STUDY N/A 01/07/2021   Procedure: GIVENS CAPSULE STUDY;  Surgeon: Midge Minium, MD;  Location: [facility] ENDOSCOPY;  Service: Endoscopy;  Laterality: N/A;   MASTECTOMY  1996   Left   MELANOMA EXCISION  1995   Head    Family History  Problem Relation Age of Onset   Breast cancer Mother    Migraines Mother    Diabetes Father    CAD Father    Thrombosis Father    Aortic aneurysm Sister    Biliary Cirrhosis Brother    Breast cancer Other        Niece    Social History    Tobacco Use   Smoking status: Never   Smokeless tobacco: Never  Substance Use Topics   Alcohol use: No    Alcohol/week: 0.0 standard drinks of alcohol     Current Outpatient Medications:    butalbital-acetaminophen-caffeine (FIORICET) 50-325-40 MG tablet, Take 1 tablet by mouth every 6 (six) hours as needed., Disp: 60 tablet, Rfl: 0   Calcium-Magnesium 500-250 MG TABS, Take 1 tablet by mouth 2 (two) times daily., Disp: , Rfl:    CINNAMON PO, Take 1 capsule by mouth daily. 350 mg, Disp: , Rfl:    Cranberry 500 MG CAPS, Take 1 tablet by mouth daily., Disp: , Rfl:    ferrous sulfate (FEROSUL) 325 (65 FE) MG tablet, Take 1 tablet (325 mg total) by mouth daily with breakfast., Disp: 90 tablet, Rfl: 1   Misc Natural Products (LEG VEIN &  CIRCULATION) TABS, Take 2 capsules by mouth daily., Disp: , Rfl:    Multiple Vitamins-Calcium (ESSENTIAL ONE DAILY MULTIVIT) TABS, Take by mouth., Disp: , Rfl:    mupirocin ointment (BACTROBAN) 2 %, Apply 1 Application topically daily., Disp: 22 g, Rfl: 0   Omega-3 Fatty Acids (FISH OIL) 1000 MG CAPS, Take by mouth., Disp: , Rfl:    triamcinolone cream (KENALOG) 0.1 %, Apply topically to lower legs 3-5 days a week for rash. Avoid applying to face, groin, and axilla. Use as directed., Disp: 80 g, Rfl: 3   esomeprazole (NEXIUM) 20 MG capsule, Take 1 capsule (20 mg total) by mouth daily before breakfast., Disp: 90 capsule, Rfl: 1  No Known Allergies  I personally reviewed active problem list, medication list, allergies, family history, social history, health maintenance with the patient/caregiver today.   ROS  Ten systems reviewed and is negative except as mentioned in HPI    Objective  Vitals:   10/22/23 1050 10/22/23 1453  BP: (!) 140/72 132/85  Pulse: 86   Resp: 16   SpO2: 99%   Weight: 146 lb (66.2 kg)   Height: 5\' 2"  (1.575 m)     Body mass index is 26.7 kg/m.  Physical Exam  Constitutional: Patient appears well-developed and  malnourished   No distress.  HEENT: head atraumatic, normocephalic, pupils equal and reactive to light, neck supple, throat within normal limits Cardiovascular: Normal rate, regular rhythm and normal heart sounds.  No murmur heard. No BLE edema. Pulmonary/Chest: Effort normal and breath sounds normal. No respiratory distress. Abdominal: Soft.  There is no tenderness. Muscular skeletal: using a cane, decrease rom of neck Psychiatric: Patient has a normal mood and affect. behavior is normal. Judgment and thought content normal.    PHQ2/9:    10/22/2023   10:51 AM 04/20/2023   10:49 AM 04/02/2023   10:25 AM 03/25/2023    2:14 PM 03/08/2023    1:36 PM  Depression screen PHQ 2/9  Decreased Interest 0 0 0 0 0  Down, Depressed, Hopeless 0 0 0 0 0  PHQ - 2 Score 0 0 0 0 0  Altered sleeping 0 0  0 0  Tired, decreased energy 0 0  0 0  Change in appetite 0 0  0 0  Feeling bad or failure about yourself  0 0  0 0  Trouble concentrating 0 0  0 0  Moving slowly or fidgety/restless 0 0  0 0  Suicidal thoughts 0 0  0 0  PHQ-9 Score 0 0  0 0  Difficult doing work/chores    Not difficult at all Not difficult at all    phq 9 is negative   Fall Risk:    10/22/2023   10:51 AM 04/20/2023   10:49 AM 04/02/2023   10:25 AM 03/25/2023    2:10 PM 03/08/2023    1:35 PM  Fall Risk   Falls in the past year? 0 1 0 0 0  Number falls in past yr: 0 0 0 0 0  Injury with Fall? 0 0 0 0 0  Risk for fall due to : No Fall Risks Impaired balance/gait  No Fall Risks No Fall Risks  Follow up Falls prevention discussed Falls prevention discussed  Education provided;Falls prevention discussed Falls prevention discussed;Education provided;Falls evaluation completed      Functional Status Survey: Is the patient deaf or have difficulty hearing?: Yes Does the patient have difficulty seeing, even when wearing glasses/contacts?: No Does the patient have  difficulty concentrating, remembering, or making decisions?: No Does  the patient have difficulty walking or climbing stairs?: Yes Does the patient have difficulty dressing or bathing?: No Does the patient have difficulty doing errands alone such as visiting a doctor's office or shopping?: No    Assessment & Plan  1. Mild episode of recurrent major depressive disorder (HCC)  Not interested in medication  2. Angina at rest [facility])  Doing well lately  3. Need for immunization against influenza  - Flu Vaccine Trivalent High Dose (Fluad)  4. Stasis dermatitis of both legs  Doing well with massage and compression stocking hoses  5. Cervical radiculopathy  She will call back with the name of Ortho she wants to be seen  6. AVM (arteriovenous malformation) of small bowel, acquired  Seen by GI  7. History of iron deficiency anemia  Resume iron supplementation   8. Menopausal osteoporosis  - DG Bone Density; Future  9. Gastroesophageal reflux disease without esophagitis  - esomeprazole (NEXIUM) 20 MG capsule; Take 1 capsule (20 mg total) by mouth daily before breakfast.  Dispense: 90 capsule; Refill: 1  10. Tension headache  She will call for Fioricet

## 2023-10-22 ENCOUNTER — Other Ambulatory Visit: Payer: Self-pay | Admitting: Family Medicine

## 2023-10-22 ENCOUNTER — Encounter: Payer: Self-pay | Admitting: Family Medicine

## 2023-10-22 ENCOUNTER — Ambulatory Visit: Payer: Medicare PPO | Admitting: Family Medicine

## 2023-10-22 VITALS — BP 132/85 | HR 86 | Resp 16 | Ht 62.0 in | Wt 146.0 lb

## 2023-10-22 DIAGNOSIS — M5412 Radiculopathy, cervical region: Secondary | ICD-10-CM

## 2023-10-22 DIAGNOSIS — Z23 Encounter for immunization: Secondary | ICD-10-CM

## 2023-10-22 DIAGNOSIS — I872 Venous insufficiency (chronic) (peripheral): Secondary | ICD-10-CM

## 2023-10-22 DIAGNOSIS — G44209 Tension-type headache, unspecified, not intractable: Secondary | ICD-10-CM

## 2023-10-22 DIAGNOSIS — K219 Gastro-esophageal reflux disease without esophagitis: Secondary | ICD-10-CM

## 2023-10-22 DIAGNOSIS — Z862 Personal history of diseases of the blood and blood-forming organs and certain disorders involving the immune mechanism: Secondary | ICD-10-CM

## 2023-10-22 DIAGNOSIS — K5520 Angiodysplasia of colon without hemorrhage: Secondary | ICD-10-CM | POA: Diagnosis not present

## 2023-10-22 DIAGNOSIS — F330 Major depressive disorder, recurrent, mild: Secondary | ICD-10-CM

## 2023-10-22 DIAGNOSIS — M810 Age-related osteoporosis without current pathological fracture: Secondary | ICD-10-CM | POA: Diagnosis not present

## 2023-10-22 DIAGNOSIS — I2089 Other forms of angina pectoris: Secondary | ICD-10-CM

## 2023-10-22 MED ORDER — ESOMEPRAZOLE MAGNESIUM 20 MG PO CPDR: 20.0000 mg | DELAYED_RELEASE_CAPSULE | Freq: Every day | ORAL | 1 refills | Status: DC

## 2023-10-26 ENCOUNTER — Ambulatory Visit (INDEPENDENT_AMBULATORY_CARE_PROVIDER_SITE_OTHER): Payer: Medicare PPO | Admitting: Vascular Surgery

## 2023-11-19 ENCOUNTER — Ambulatory Visit (INDEPENDENT_AMBULATORY_CARE_PROVIDER_SITE_OTHER): Payer: Medicare PPO | Admitting: Vascular Surgery

## 2024-01-04 ENCOUNTER — Ambulatory Visit (INDEPENDENT_AMBULATORY_CARE_PROVIDER_SITE_OTHER): Payer: Medicare PPO | Admitting: Vascular Surgery

## 2024-02-29 ENCOUNTER — Ambulatory Visit (INDEPENDENT_AMBULATORY_CARE_PROVIDER_SITE_OTHER): Payer: Medicare PPO | Admitting: Vascular Surgery

## 2024-03-14 ENCOUNTER — Ambulatory Visit (INDEPENDENT_AMBULATORY_CARE_PROVIDER_SITE_OTHER): Admitting: Vascular Surgery

## 2024-03-14 ENCOUNTER — Encounter (INDEPENDENT_AMBULATORY_CARE_PROVIDER_SITE_OTHER): Payer: Self-pay | Admitting: Vascular Surgery

## 2024-03-14 VITALS — BP 189/67 | HR 83 | Resp 18 | Ht 61.0 in | Wt 135.4 lb

## 2024-03-14 DIAGNOSIS — I10 Essential (primary) hypertension: Secondary | ICD-10-CM | POA: Diagnosis not present

## 2024-03-14 DIAGNOSIS — E7849 Other hyperlipidemia: Secondary | ICD-10-CM | POA: Diagnosis not present

## 2024-03-14 DIAGNOSIS — I83893 Varicose veins of bilateral lower extremities with other complications: Secondary | ICD-10-CM

## 2024-03-14 NOTE — Assessment & Plan Note (Signed)
 blood pressure control important in reducing the progression of atherosclerotic disease. On appropriate oral medications.

## 2024-03-14 NOTE — Progress Notes (Signed)
 MRN : [id number]  Christina Salas is a 89 y.o. (04-Feb-1933) female who presents with chief complaint of  Chief Complaint  Patient presents with   Follow-up    3-4 month no studies.  .  History of Present Illness: Patient returns today in follow up of her leg swelling.  The legs are generally doing better.  She is having mild pain.  There is a little more swelling on the left than the right.  Neither 1 is particularly swollen today.  No new ulceration or infection.  Current Outpatient Medications  Medication Sig Dispense Refill   butalbital-acetaminophen-caffeine (FIORICET) 50-325-40 MG tablet TAKE 1 TABLET BY MOUTH EVERY 6 HOURS AS NEEDED 60 tablet 0   Calcium-Magnesium 500-250 MG TABS Take 1 tablet by mouth 2 (two) times daily.     CINNAMON PO Take 1 capsule by mouth daily. 350 mg     Cranberry 500 MG CAPS Take 1 tablet by mouth daily.     esomeprazole (NEXIUM) 20 MG capsule Take 1 capsule (20 mg total) by mouth daily before breakfast. 90 capsule 1   ferrous sulfate (FEROSUL) 325 (65 FE) MG tablet Take 1 tablet (325 mg total) by mouth daily with breakfast. 90 tablet 1   Misc Natural Products (LEG VEIN & CIRCULATION) TABS Take 2 capsules by mouth daily.     Multiple Vitamins-Calcium (ESSENTIAL ONE DAILY MULTIVIT) TABS Take by mouth.     mupirocin ointment (BACTROBAN) 2 % Apply 1 Application topically daily. 22 g 0   Omega-3 Fatty Acids (FISH OIL) 1000 MG CAPS Take by mouth.     triamcinolone cream (KENALOG) 0.1 % Apply topically to lower legs 3-5 days a week for rash. Avoid applying to face, groin, and axilla. Use as directed. 80 g 3   No current facility-administered medications for this visit.    Past Medical History:  Diagnosis Date   Abnormal glucose    Anxiety    Arthritis    Breast cancer (HCC) 1996   Left- mastectomy   Cancer (HCC)    melanoma   Chronic headaches    Depression    Dysplastic nevus 05/11/2007   right upper gastric area. Slight to moderate atypia.    Dysplastic nevus 06/24/2020   R lateral deltoid, moderate to severe atypia. Excised 08/13/2020, margins free.   GERD (gastroesophageal reflux disease)    H/O melanoma excision    History of diverticulitis    History of recurrent UTIs    Hypertension    Melanoma (HCC)    scalp   Osteoporosis    pre-osetoporosis per patient history form 03/18/12   Pre-diabetes    Vitamin D deficiency     Past Surgical History:  Procedure Laterality Date   ABDOMINAL HYSTERECTOMY  2001   BREAST BIOPSY  2010   BREAST EXCISIONAL BIOPSY Right 1997   Benign excisional biopsy    CATARACT EXTRACTION Left 01/30/2021   Dr. Maris Berger   COLONOSCOPY  04/02/2014   COLONOSCOPY WITH PROPOFOL N/A 12/27/2018   Procedure: COLONOSCOPY WITH PROPOFOL;  Surgeon: Midge Minium, MD;  Location: [facility] ENDOSCOPY;  Service: Endoscopy;  Laterality: N/A;   CYSTOSCOPY  11/2014   ESOPHAGOGASTRODUODENOSCOPY (EGD) WITH PROPOFOL N/A 12/27/2018   Procedure: ESOPHAGOGASTRODUODENOSCOPY (EGD) WITH PROPOFOL;  Surgeon: Midge Minium, MD;  Location: [facility] ENDOSCOPY;  Service: Endoscopy;  Laterality: N/A;   ESOPHAGOGASTRODUODENOSCOPY (EGD) WITH PROPOFOL N/A 01/07/2021   Procedure: ESOPHAGOGASTRODUODENOSCOPY (EGD) WITH PROPOFOL;  Surgeon: Midge Minium, MD;  Location: [facility] ENDOSCOPY;  Service: Endoscopy;  Laterality: N/A;   GIVENS CAPSULE STUDY N/A 01/07/2021   Procedure: GIVENS CAPSULE STUDY;  Surgeon: Midge Minium, MD;  Location: [facility] ENDOSCOPY;  Service: Endoscopy;  Laterality: N/A;   MASTECTOMY  1996   Left   MELANOMA EXCISION  1995   Head     Social History   Tobacco Use   Smoking status: Never   Smokeless tobacco: Never  Vaping Use   Vaping status: Never Used  Substance Use Topics   Alcohol use: No    Alcohol/week: 0.0 standard drinks of alcohol   Drug use: Never       Family History  Problem Relation Age of Onset   Breast cancer Mother    Migraines Mother    Diabetes Father    CAD Father    Thrombosis Father     Aortic aneurysm Sister    Biliary Cirrhosis Brother    Breast cancer Other        Niece     No Known Allergies   REVIEW OF SYSTEMS (Negative unless checked)  Constitutional: [] Weight loss  [] Fever  [] Chills Cardiac: [] Chest pain   [] Chest pressure   [] Palpitations   [] Shortness of breath when laying flat   [] Shortness of breath at rest   [] Shortness of breath with exertion. Vascular:  [] Pain in legs with walking   [] Pain in legs at rest   [] Pain in legs when laying flat   [] Claudication   [] Pain in feet when walking  [] Pain in feet at rest  [] Pain in feet when laying flat   [] History of DVT   [] Phlebitis   [x] Swelling in legs   [x] Varicose veins   [] Non-healing ulcers Pulmonary:   [] Uses home oxygen   [] Productive cough   [] Hemoptysis   [] Wheeze  [] COPD   [] Asthma Neurologic:  [] Dizziness  [] Blackouts   [] Seizures   [] History of stroke   [] History of TIA  [] Aphasia   [] Temporary blindness   [] Dysphagia   [] Weakness or numbness in arms   [] Weakness or numbness in legs Musculoskeletal:  [x] Arthritis   [] Joint swelling   [] Joint pain   [] Low back pain Hematologic:  [] Easy bruising  [] Easy bleeding   [] Hypercoagulable state   [] Anemic   Gastrointestinal:  [] Blood in stool   [] Vomiting blood  [x] Gastroesophageal reflux/heartburn   [] Abdominal pain Genitourinary:  [] Chronic kidney disease   [] Difficult urination  [] Frequent urination  [] Burning with urination   [] Hematuria Skin:  [] Rashes   [] Ulcers   [] Wounds Psychological:  [x] History of anxiety   [x]  History of major depression.  Physical Examination  BP (!) 189/67   Pulse 83   Resp 18   Ht 5\' 1"  (1.549 m)   Wt 135 lb 6.4 oz (61.4 kg)   BMI 25.58 kg/m  Gen:  WD/WN, NAD. Appears younger than stated age. Head: [place]/AT, No temporalis wasting. Ear/Nose/Throat: Hearing grossly intact, nares w/o erythema or drainage Eyes: Conjunctiva clear. Sclera non-icteric Neck: Supple.  Trachea midline Pulmonary:  Good air movement, no use of  accessory muscles.  Cardiac: RRR, no JVD Vascular:  Vessel Right Left  Radial Palpable Palpable               Musculoskeletal: M/S 5/5 throughout.  No deformity or atrophy.  Mild to moderate stasis dermatitis changes.  Mild swelling of both lower extremities today.  Edema. Neurologic: Sensation grossly intact in extremities.  Symmetrical.  Speech is fluent.  Psychiatric: Judgment intact, Mood & affect appropriate for pt's clinical situation. Dermatologic: No rashes or ulcers  noted.  No cellulitis or open wounds.      Labs No results found for this or any previous visit (from the past 2160 hours).  Radiology No results found.  Assessment/Plan  Hypertension blood pressure control important in reducing the progression of atherosclerotic disease. On appropriate oral medications.   Varicose veins of leg with swelling, bilateral Symptom control is currently good.  No new changes or problems.  She has gotten her pump but has not yet become to use this.  Will just plan to follow-up in about 3 to 6 months to see how she is doing.    Festus Barren, MD  03/14/2024 5:18 PM    This note was created with Dragon medical transcription system.  Any errors from dictation are purely unintentional

## 2024-03-14 NOTE — Assessment & Plan Note (Signed)
 Symptom control is currently good.  No new changes or problems.  She has gotten her pump but has not yet become to use this.  Will just plan to follow-up in about 3 to 6 months to see how she is doing.

## 2024-03-22 ENCOUNTER — Other Ambulatory Visit: Payer: Self-pay | Admitting: Student

## 2024-03-22 DIAGNOSIS — R296 Repeated falls: Secondary | ICD-10-CM

## 2024-03-22 DIAGNOSIS — R2689 Other abnormalities of gait and mobility: Secondary | ICD-10-CM

## 2024-03-27 ENCOUNTER — Encounter: Payer: Self-pay | Admitting: Student

## 2024-03-30 ENCOUNTER — Ambulatory Visit: Payer: Self-pay

## 2024-03-30 DIAGNOSIS — Z0000 Encounter for general adult medical examination without abnormal findings: Secondary | ICD-10-CM | POA: Diagnosis not present

## 2024-03-30 NOTE — Patient Instructions (Addendum)
 Christina Salas , Thank you for taking time to come for your Medicare Wellness Visit. I appreciate your ongoing commitment to your health goals. Please review the following plan we discussed and let me know if I can assist you in the future.   Referrals/Orders/Follow-Ups/Clinician Recommendations: NONE  This is a list of the screening recommended for you and due dates:  Health Maintenance  Topic Date Due   Zoster (Shingles) Vaccine (1 of 2) Never done   DTaP/Tdap/Td vaccine (2 - Td or Tdap) 12/03/2020   COVID-19 Vaccine (6 - Pfizer risk 2024-25 season) 03/24/2024   Flu Shot  07/28/2024   Mammogram  09/16/2024   Medicare Annual Wellness Visit  03/30/2025   DEXA scan (bone density measurement)  09/11/2027   Pneumonia Vaccine  Completed   HPV Vaccine  Aged Out    Advanced directives: (ACP Link)Information on Advanced Care Planning can be found at [facility] Advance Health Care Directives Advance Health Care Directives. [email or web address]   Next Medicare Annual Wellness Visit scheduled for next year: Yes   04/05/25 @ 1:10PM BY PHONE

## 2024-03-30 NOTE — Progress Notes (Signed)
 Subjective:   Christina Salas is a 89 y.o. who presents for a Medicare Wellness preventive visit.  Visit Complete: Virtual I connected with  Christina Salas on 03/30/24 by a audio enabled telemedicine application and verified that I am speaking with the correct person using two identifiers.  Patient Location: Home  Provider Location: Office/Clinic  I discussed the limitations of evaluation and management by telemedicine. The patient expressed understanding and agreed to proceed.  Vital Signs: Because this visit was a virtual/telehealth visit, some criteria may be missing or patient reported. Any vitals not documented were not able to be obtained and vitals that have been documented are patient reported.  VideoDeclined- This patient declined Librarian, academic. Therefore the visit was completed with audio only.  Persons Participating in Visit: Patient.  AWV Questionnaire: No: Patient Medicare AWV questionnaire was not completed prior to this visit.  Cardiac Risk Factors include: advanced age (>34men, >3 women);sedentary lifestyle;hypertension;dyslipidemia     Objective:    There were no vitals filed for this visit. There is no height or weight on file to calculate BMI.     03/30/2024    1:18 PM 03/25/2023    2:16 PM 11/11/2022    3:21 PM 03/24/2022   10:50 AM 02/18/2021    3:45 PM 01/07/2021    7:26 AM 01/30/2020    3:05 PM  Advanced Directives  Does Patient Have a Medical Advance Directive? No No No No No No No  Would patient like information on creating a medical advance directive? No - Patient declined  No - Patient declined No - Patient declined No - Patient declined  Yes (MAU/Ambulatory/Procedural Areas - Information given)    Current Medications (verified) Outpatient Encounter Medications as of 03/30/2024  Medication Sig   butalbital-acetaminophen-caffeine (FIORICET) 50-325-40 MG tablet TAKE 1 TABLET BY MOUTH EVERY 6 HOURS AS NEEDED    Calcium-Magnesium 500-250 MG TABS Take 1 tablet by mouth 2 (two) times daily.   CINNAMON PO Take 1 capsule by mouth daily. 350 mg   Cranberry 500 MG CAPS Take 1 tablet by mouth daily.   esomeprazole (NEXIUM) 20 MG capsule Take 1 capsule (20 mg total) by mouth daily before breakfast.   ferrous sulfate (FEROSUL) 325 (65 FE) MG tablet Take 1 tablet (325 mg total) by mouth daily with breakfast.   Misc Natural Products (LEG VEIN & CIRCULATION) TABS Take 2 capsules by mouth daily.   Multiple Vitamins-Calcium (ESSENTIAL ONE DAILY MULTIVIT) TABS Take by mouth.   mupirocin ointment (BACTROBAN) 2 % Apply 1 Application topically daily.   Omega-3 Fatty Acids (FISH OIL) 1000 MG CAPS Take by mouth.   triamcinolone cream (KENALOG) 0.1 % Apply topically to lower legs 3-5 days a week for rash. Avoid applying to face, groin, and axilla. Use as directed. (Patient not taking: Reported on 03/30/2024)   No facility-administered encounter medications on file as of 03/30/2024.    Allergies (verified) Patient has no allergy information on record.   History: Past Medical History:  Diagnosis Date   Abnormal glucose    Anxiety    Arthritis    Breast cancer (HCC) 1996   Left- mastectomy   Cancer (HCC)    melanoma   Chronic headaches    Depression    Dysplastic nevus 05/11/2007   right upper gastric area. Slight to moderate atypia.   Dysplastic nevus 06/24/2020   R lateral deltoid, moderate to severe atypia. Excised 08/13/2020, margins free.   GERD (gastroesophageal reflux disease)  H/O melanoma excision    History of diverticulitis    History of recurrent UTIs    Hypertension    Melanoma (HCC)    scalp   Osteoporosis    pre-osetoporosis per patient history form 03/18/12   Pre-diabetes    Vitamin D deficiency    Past Surgical History:  Procedure Laterality Date   ABDOMINAL HYSTERECTOMY  2001   BREAST BIOPSY  2010   BREAST EXCISIONAL BIOPSY Right 1997   Benign excisional biopsy    CATARACT EXTRACTION  Left 01/30/2021   Dr. Maris Berger   COLONOSCOPY  04/02/2014   COLONOSCOPY WITH PROPOFOL N/A 12/27/2018   Procedure: COLONOSCOPY WITH PROPOFOL;  Surgeon: Midge Minium, MD;  Location: [facility] ENDOSCOPY;  Service: Endoscopy;  Laterality: N/A;   CYSTOSCOPY  11/2014   ESOPHAGOGASTRODUODENOSCOPY (EGD) WITH PROPOFOL N/A 12/27/2018   Procedure: ESOPHAGOGASTRODUODENOSCOPY (EGD) WITH PROPOFOL;  Surgeon: Midge Minium, MD;  Location: [facility] ENDOSCOPY;  Service: Endoscopy;  Laterality: N/A;   ESOPHAGOGASTRODUODENOSCOPY (EGD) WITH PROPOFOL N/A 01/07/2021   Procedure: ESOPHAGOGASTRODUODENOSCOPY (EGD) WITH PROPOFOL;  Surgeon: Midge Minium, MD;  Location: [facility] ENDOSCOPY;  Service: Endoscopy;  Laterality: N/A;   GIVENS CAPSULE STUDY N/A 01/07/2021   Procedure: GIVENS CAPSULE STUDY;  Surgeon: Midge Minium, MD;  Location: [facility] ENDOSCOPY;  Service: Endoscopy;  Laterality: N/A;   MASTECTOMY  1996   Left   MELANOMA EXCISION  1995   Head   Family History  Problem Relation Age of Onset   Breast cancer Mother    Migraines Mother    Diabetes Father    CAD Father    Thrombosis Father    Aortic aneurysm Sister    Biliary Cirrhosis Brother    Breast cancer Other        Niece   Social History   Socioeconomic History   Marital status: Single    Spouse name: Not on file   Number of children: 0   Years of education: Not on file   Highest education level: Master's degree (e.g., MA, MS, MEng, MEd, MSW, MBA)  Occupational History   Occupation: Retired  Tobacco Use   Smoking status: Never   Smokeless tobacco: Never  Vaping Use   Vaping status: Never Used  Substance and Sexual Activity   Alcohol use: No    Alcohol/week: 0.0 standard drinks of alcohol   Drug use: Never   Sexual activity: Not Currently  Other Topics Concern   Not on file  Social History Narrative   She lives by herself   She has nieces and nephews in the area   She was a Runner, 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, retired in 1992   Social Drivers of  Health   Financial Resource Strain: Low Risk  (03/30/2024)   Overall Financial Resource Strain (CARDIA)    Difficulty of Paying Living Expenses: Not hard at all  Food Insecurity: No Food Insecurity (03/30/2024)   Hunger Vital Sign    Worried About Running Out of Food in the Last Year: Never true    Ran Out of Food in the Last Year: Never true  Transportation Needs: No Transportation Needs (03/30/2024)   PRAPARE - Administrator, Civil Service (Medical): No    Lack of Transportation (Non-Medical): No  Physical Activity: Inactive (03/30/2024)   Exercise Vital Sign    Days of Exercise per Week: 0 days    Minutes of Exercise per Session: 0 min  Stress: No Stress Concern Present (03/30/2024)   [employer] of Occupational Health - Occupational  Stress Questionnaire    Feeling of Stress : Only a little  Social Connections: Moderately Isolated (03/30/2024)   Social Connection and Isolation Panel [NHANES]    Frequency of Communication with Friends and Family: More than three times a week    Frequency of Social Gatherings with Friends and Family: Never    Attends Religious Services: More than 4 times per year    Active Member of [employer] or Organizations: No    Attends Engineer, structural: Never    Marital Status: Never married    Tobacco Counseling Counseling given: Not Answered    Clinical Intake:  Pre-visit preparation completed: Yes  Pain : No/denies pain     BMI - recorded: 25.5 Nutritional Status: BMI 25 -29 Overweight Nutritional Risks: None Diabetes: No  Lab Results  Component Value Date   HGBA1C 5.5 10/16/2021   HGBA1C 5.8 (H) 10/22/2020   HGBA1C 6.3 (H) 08/18/2019     How often do you need to have someone help you when you read instructions, pamphlets, or other written materials from your doctor or pharmacy?: 1 - Never  Interpreter Needed?: No  Information entered by :: Kennedy Bucker, LPN   Activities of Daily Living     03/30/2024    1:20 PM  10/22/2023   10:51 AM  In your present state of health, do you have any difficulty performing the following activities:  Hearing? 1 1  Vision? 0 0  Difficulty concentrating or making decisions? 1 0  Comment MEMORY   Walking or climbing stairs? 1 1  Dressing or bathing? 0 0  Doing errands, shopping? 0 0  Preparing Food and eating ? N   Using the Toilet? N   In the past six months, have you accidently leaked urine? N   Do you have problems with loss of bowel control? N   Managing your Medications? N   Managing your Finances? N   Housekeeping or managing your Housekeeping? N     Patient Care Team: Alba Cory, MD as PCP - General (Family Medicine) Wyn Quaker, Marlow Baars, MD as Consulting Physician (Vascular Surgery) Alwyn Pea, MD as Consulting Physician (Cardiology) Maris Berger, MD as Consulting Physician (Ophthalmology) Deirdre Evener, MD as Consulting Physician (Dermatology) Rosetta Posner, DPM as Consulting Physician (Podiatry) Midge Minium, MD as Consulting Physician (Gastroenterology) Jerilee Field, MD as Consulting Physician (Urology) Maris Berger, MD as Consulting Physician (Ophthalmology)  Indicate any recent Medical Services you may have received from other than [facility] providers in the past year (date may be approximate).     Assessment:   This is a routine wellness examination for Christina Salas.  Hearing/Vision screen Hearing Screening - Comments:: NO AIDS Vision Screening - Comments:: WEARS GLASSES ALL THE TIME- [email or web address]   Goals Addressed             This Visit's Progress    DIET - EAT MORE FRUITS AND VEGETABLES         Depression Screen     03/30/2024    1:15 PM 10/22/2023   10:51 AM 04/20/2023   10:49 AM 04/02/2023   10:25 AM 03/25/2023    2:14 PM 03/08/2023    1:36 PM 03/04/2023    3:14 PM  PHQ 2/9 Scores  PHQ - 2 Score 2 0 0 0 0 0 0  PHQ- 9 Score 2 0 0  0 0 0    Fall Risk     03/30/2024    1:20 PM 10/22/2023   10:51 AM 04/20/2023  10:49 AM 04/02/2023   10:25 AM 03/25/2023    2:10 PM  Fall Risk   Falls in the past year? 1 0 1 0 0  Number falls in past yr: 0 0 0 0 0  Injury with Fall? 0 0 0 0 0  Risk for fall due to : History of fall(s);Impaired mobility No Fall Risks Impaired balance/gait  No Fall Risks  Follow up Falls prevention discussed;Falls evaluation completed Falls prevention discussed Falls prevention discussed  Education provided;Falls prevention discussed    MEDICARE RISK AT HOME:  Medicare Risk at Home Any stairs in or around the home?: Yes If so, are there any without handrails?: No Home free of loose throw rugs in walkways, pet beds, electrical cords, etc?: Yes Adequate lighting in your home to reduce risk of falls?: Yes Life alert?: No Use of a cane, Champagne or w/c?: Yes (QUAD CANE) Grab bars in the bathroom?: Yes Shower chair or bench in shower?: No Elevated toilet seat or a handicapped toilet?: Yes  TIMED UP AND GO:  Was the test performed?  No  Cognitive Function: 6CIT completed        03/30/2024    1:22 PM 03/25/2023    2:22 PM 01/30/2020    3:09 PM 01/24/2019    9:34 AM  6CIT Screen  What Year? 0 points 0 points 0 points 0 points  What month? 0 points 0 points 0 points 0 points  What time? 0 points 0 points 0 points 0 points  Count back from 20 0 points 0 points 0 points 0 points  Months in reverse 0 points 0 points 0 points 0 points  Repeat phrase 0 points 4 points 4 points 2 points  Total Score 0 points 4 points 4 points 2 points    Immunizations Immunization History  Administered Date(s) Administered   Fluad Quad(high Dose 65+) 08/18/2019, 10/15/2020, 10/15/2021, 10/16/2022   Fluad Trivalent(High Dose 65+) 10/22/2023   Influenza Split 09/10/2007, 11/13/2008, 09/27/2009, 10/17/2010   Influenza, High Dose Seasonal PF 10/21/2015, 09/28/2016, 10/25/2017, 10/19/2018   Influenza, Seasonal, Injecte, Preservative Fre 10/30/2011, 10/21/2012   Influenza,inj,Quad PF,6+ Mos 09/06/2013,  10/19/2014   PFIZER(Purple Top)SARS-COV-2 Vaccination 01/23/2020, 02/13/2020, 10/28/2020   Pfizer Covid-19 Vaccine Bivalent Booster 32yrs & up 06/10/2021   Pneumococcal Conjugate-13 12/03/2014   Pneumococcal Polysaccharide-23 06/15/2012   Tdap 12/03/2010   Unspecified SARS-COV-2 Vaccination 09/25/2023    Screening Tests Health Maintenance  Topic Date Due   Zoster Vaccines- Shingrix (1 of 2) Never done   DTaP/Tdap/Td (2 - Td or Tdap) 12/03/2020   COVID-19 Vaccine (6 - Pfizer risk 2024-25 season) 03/24/2024   INFLUENZA VACCINE  07/28/2024   MAMMOGRAM  09/16/2024   Medicare Annual Wellness (AWV)  03/30/2025   DEXA SCAN  09/11/2027   Pneumonia Vaccine 30+ Years old  Completed   HPV VACCINES  Aged Out    Health Maintenance  Health Maintenance Due  Topic Date Due   Zoster Vaccines- Shingrix (1 of 2) Never done   DTaP/Tdap/Td (2 - Td or Tdap) 12/03/2020   COVID-19 Vaccine (6 - Pfizer risk 2024-25 season) 03/24/2024   Health Maintenance Items Addressed: AGED OUT MAMMOGRAM & COLONOSCOPY & BONE DENSITY - SHOTS UP TO DATE, BUT NEVER HAD SHINGRIX  Additional Screening:  Vision Screening: Recommended annual ophthalmology exams for early detection of glaucoma and other disorders of the eye.  Dental Screening: Recommended annual dental exams for proper oral hygiene  Community Resource Referral / Chronic Care Management: CRR required this visit?  No   CCM required this visit?  No     Plan:     I have personally reviewed and noted the following in the patient's chart:   Medical and social history Use of alcohol, tobacco or illicit drugs  Current medications and supplements including opioid prescriptions. Patient is currently taking opioid prescriptions. Information provided to patient regarding non-opioid alternatives. Patient advised to discuss non-opioid treatment plan with their provider. Functional ability and status Nutritional status Physical activity Advanced  directives List of other physicians Hospitalizations, surgeries, and ER visits in previous 12 months Vitals Screenings to include cognitive, depression, and falls Referrals and appointments  In addition, I have reviewed and discussed with patient certain preventive protocols, quality metrics, and best practice recommendations. A written personalized care plan for preventive services as well as general preventive health recommendations were provided to patient.     Hal Hope, LPN   09/03/2951   After Visit Summary: (MyChart) Due to this being a telephonic visit, the after visit summary with patients personalized plan was offered to patient via MyChart   Notes: Nothing significant to report at this time.

## 2024-04-05 ENCOUNTER — Ambulatory Visit (INDEPENDENT_AMBULATORY_CARE_PROVIDER_SITE_OTHER): Admitting: Family Medicine

## 2024-04-05 ENCOUNTER — Encounter: Payer: Self-pay | Admitting: Family Medicine

## 2024-04-05 VITALS — BP 130/76 | HR 92 | Resp 16 | Ht 61.0 in | Wt 134.6 lb

## 2024-04-05 DIAGNOSIS — Z79899 Other long term (current) drug therapy: Secondary | ICD-10-CM

## 2024-04-05 DIAGNOSIS — E559 Vitamin D deficiency, unspecified: Secondary | ICD-10-CM

## 2024-04-05 DIAGNOSIS — F330 Major depressive disorder, recurrent, mild: Secondary | ICD-10-CM

## 2024-04-05 DIAGNOSIS — K219 Gastro-esophageal reflux disease without esophagitis: Secondary | ICD-10-CM

## 2024-04-05 DIAGNOSIS — Z862 Personal history of diseases of the blood and blood-forming organs and certain disorders involving the immune mechanism: Secondary | ICD-10-CM | POA: Diagnosis not present

## 2024-04-05 DIAGNOSIS — K5520 Angiodysplasia of colon without hemorrhage: Secondary | ICD-10-CM

## 2024-04-05 DIAGNOSIS — E538 Deficiency of other specified B group vitamins: Secondary | ICD-10-CM

## 2024-04-05 DIAGNOSIS — M5412 Radiculopathy, cervical region: Secondary | ICD-10-CM

## 2024-04-05 DIAGNOSIS — I2089 Other forms of angina pectoris: Secondary | ICD-10-CM | POA: Diagnosis not present

## 2024-04-05 DIAGNOSIS — I872 Venous insufficiency (chronic) (peripheral): Secondary | ICD-10-CM

## 2024-04-05 DIAGNOSIS — G44209 Tension-type headache, unspecified, not intractable: Secondary | ICD-10-CM

## 2024-04-05 DIAGNOSIS — M810 Age-related osteoporosis without current pathological fracture: Secondary | ICD-10-CM

## 2024-04-05 DIAGNOSIS — E781 Pure hyperglyceridemia: Secondary | ICD-10-CM

## 2024-04-05 MED ORDER — ESOMEPRAZOLE MAGNESIUM 20 MG PO CPDR: 20.0000 mg | DELAYED_RELEASE_CAPSULE | Freq: Every day | ORAL | 1 refills | Status: DC

## 2024-04-05 MED ORDER — BUTALBITAL-APAP-CAFFEINE 50-325-40 MG PO TABS: 1.0000 | ORAL_TABLET | Freq: Four times a day (QID) | ORAL | 0 refills | Status: DC | PRN

## 2024-04-05 NOTE — Progress Notes (Signed)
 Name: Christina Salas   MRN: [id number]    DOB: 05/16/1933   Date:04/05/2024       Progress Note  Subjective  Chief Complaint  Chief Complaint  Patient presents with   Annual Exam   HPI   Cervical Radiculopathy: she has decrease rom of neck and  occasionally tingling down to right shoulder  but not lately.   She states her neck is causing problems because she can not look up to walk, she had one session of PT but has not been back. She also has to use a cane, feels weak , she would like to see Dr. Novella Olive for further evaluation    Tension headache: she is taking fioricet prn and needs a refill, she describes headaches as dull and or sharp, not as frequent now, she states baclofen also helps when she has a headache but causes drowsiness . Usually frontal, very seldom she has associated  nausea or vomiting. She has been taking Fioricet prn. She continues to take Tylenol daily.    Iron deficiency: She was seen by Dr. Lars Pinks back in 11/2018, had EGD and colonoscopy , she also had multiple AVM of small bowel , Dr. Lars Pinks discussed either push enteroscopy versus double balloon enteroscopy, or continue monitoring for anemia and take iron supplementation. She stopped taking iron supplements months ago,  we will recheck levels today    Post-menopausal osteoporosis : she used to take Fosamax but stopped going due to having dental work done.  She had two shots of  Prolia , given by Dr. Patrecia Pace. He retired and she lost to follow up. Advised to follow up with Endo and resume medications but she wants to hold off for now    Mild Major Depression: she has long history of depression, she states not feeling great right now, states life is boring, denies suicidal thoughts or ideation, but has lack of energy and also lack of appetite. She lost a lot of weight since the pandemic and has not gained it back   Pre-diabetes: she denies polyphagia or polyuria, she feels her mouth gets dry at times. She continues to  lose weight. We will recheck labs today  GERD: controlled with Nexium at this time, she is aware of long term side effects. She denies heart burn or indigestion .She was seeing Dr. Servando Snare, he referred her to [facility] for further evaluation of AVM small bowel   Malnutrition: she lost 20 + lbs from pre-pandemic to 2021, baseline weight was 170 lbs and in 2024 it was in the 140's. Today weight is down another 10 lbs. Discussed importance of protein intake and high calories diet , she states she does not want to gain weight.   Angina: she sees Dr. Juliann Pares yearly. She states  symptoms are occasional now . Does not take medications    Echo from 2017  NORMAL LEFT VENTRICULAR SYSTOLIC FUNCTION WITH AN ESTIMATED EF = >55 %  NORMAL RIGHT VENTRICULAR SYSTOLIC FUNCTION  MILD TRICUSPID AND MITRAL VALVE INSUFFICIENCY  TRACE AORTIC VALVE INSUFFICIENCY  NO VALVULAR STENOSIS  MILD BIATRIAL ENLARGEMENT    ECG Interpretation: 2017  Rest ECG:  normal sinus rhythm, none  Stress ECG:  normal sinus rhythm,  Recovery ECG:  normal sinus rhythm  ECG Interpretation:  non-diagnostic due to pharmacologic testing   Recent Fall: happened last month, she states fell backwards after a sensation on top of her head ( states cannot explain )  Dr. Juliann Pares referred her to neurologist and has an  appointment with Dr. Malvin Johns next month    Vitamin D :she is taking 2000 units daily  and last level was at goal , we will recheck level    Lymphedema: seen by Dr. Wyn Quaker and wearing compression stocking hoses and is doing better, states her legs are looking good   Patient Active Problem List   Diagnosis Date Noted   Hypertension    Lymphedema 07/13/2023   Major depressive disorder, single episode, mild (HCC) 04/20/2023   Menopausal osteoporosis 10/16/2022   History of iron deficiency anemia 10/16/2022   AVM (arteriovenous malformation) of small bowel, acquired 10/16/2022   Neuropathy 10/16/2022   Stricture and stenosis of esophagus     Large hiatal hernia 01/24/2019   Internal hemorrhoids 01/24/2019   Iron deficiency anemia due to chronic blood loss    Venous reflux 04/19/2018   Prediabetes 06/08/2016   Obesity (BMI 30.0-34.9) 06/08/2016   Vitamin D deficiency 06/08/2016   Post-menopausal 12/11/2015   DD (diverticular disease) 06/19/2015   Esophageal reflux 06/19/2015   HLD (hyperlipidemia) 06/19/2015   Varicose veins of leg with swelling, bilateral 06/19/2015   Recurrent UTI 02/08/2013   Mixed incontinence 02/08/2013   History of breast cancer, left, T1, N0, Left MRM - Dec 1996 03/18/2012   Tension headache 05/24/2007   History of melanoma 08/28/1994    Past Surgical History:  Procedure Laterality Date   ABDOMINAL HYSTERECTOMY  2001   BREAST BIOPSY  2010   BREAST EXCISIONAL BIOPSY Right 1997   Benign excisional biopsy    CATARACT EXTRACTION Left 01/30/2021   Dr. Maris Berger   COLONOSCOPY  04/02/2014   COLONOSCOPY WITH PROPOFOL N/A 12/27/2018   Procedure: COLONOSCOPY WITH PROPOFOL;  Surgeon: Midge Minium, MD;  Location: [facility] ENDOSCOPY;  Service: Endoscopy;  Laterality: N/A;   CYSTOSCOPY  11/2014   ESOPHAGOGASTRODUODENOSCOPY (EGD) WITH PROPOFOL N/A 12/27/2018   Procedure: ESOPHAGOGASTRODUODENOSCOPY (EGD) WITH PROPOFOL;  Surgeon: Midge Minium, MD;  Location: [facility] ENDOSCOPY;  Service: Endoscopy;  Laterality: N/A;   ESOPHAGOGASTRODUODENOSCOPY (EGD) WITH PROPOFOL N/A 01/07/2021   Procedure: ESOPHAGOGASTRODUODENOSCOPY (EGD) WITH PROPOFOL;  Surgeon: Midge Minium, MD;  Location: [facility] ENDOSCOPY;  Service: Endoscopy;  Laterality: N/A;   GIVENS CAPSULE STUDY N/A 01/07/2021   Procedure: GIVENS CAPSULE STUDY;  Surgeon: Midge Minium, MD;  Location: [facility] ENDOSCOPY;  Service: Endoscopy;  Laterality: N/A;   MASTECTOMY  1996   Left   MELANOMA EXCISION  1995   Head    Family History  Problem Relation Age of Onset   Breast cancer Mother    Migraines Mother    Diabetes Father    CAD Father    Thrombosis Father     Aortic aneurysm Sister    Biliary Cirrhosis Brother    Breast cancer Other        Niece    Social History   Tobacco Use   Smoking status: Never   Smokeless tobacco: Never  Substance Use Topics   Alcohol use: No    Alcohol/week: 0.0 standard drinks of alcohol     Current Outpatient Medications:    butalbital-acetaminophen-caffeine (FIORICET) 50-325-40 MG tablet, TAKE 1 TABLET BY MOUTH EVERY 6 HOURS AS NEEDED, Disp: 60 tablet, Rfl: 0   Calcium-Magnesium 500-250 MG TABS, Take 1 tablet by mouth 2 (two) times daily., Disp: , Rfl:    CINNAMON PO, Take 1 capsule by mouth daily. 350 mg, Disp: , Rfl:    Cranberry 500 MG CAPS, Take 1 tablet by mouth daily., Disp: , Rfl:    esomeprazole (NEXIUM)  20 MG capsule, Take 1 capsule (20 mg total) by mouth daily before breakfast., Disp: 90 capsule, Rfl: 1   ferrous sulfate (FEROSUL) 325 (65 FE) MG tablet, Take 1 tablet (325 mg total) by mouth daily with breakfast., Disp: 90 tablet, Rfl: 1   Misc Natural Products (LEG VEIN & CIRCULATION) TABS, Take 2 capsules by mouth daily., Disp: , Rfl:    Multiple Vitamins-Calcium (ESSENTIAL ONE DAILY MULTIVIT) TABS, Take by mouth., Disp: , Rfl:    mupirocin ointment (BACTROBAN) 2 %, Apply 1 Application topically daily., Disp: 22 g, Rfl: 0   Omega-3 Fatty Acids (FISH OIL) 1000 MG CAPS, Take by mouth., Disp: , Rfl:    triamcinolone cream (KENALOG) 0.1 %, Apply topically to lower legs 3-5 days a week for rash. Avoid applying to face, groin, and axilla. Use as directed. (Patient not taking: Reported on 04/05/2024), Disp: 80 g, Rfl: 3  No Known Allergies  I personally reviewed active problem list, medication list, allergies, family history with the patient/caregiver today.   ROS  Ten systems reviewed and is negative except as mentioned in HPI .  Objective Physical Exam Constitutional: Patient appears well-developed and well-nourished.  No distress.  HEENT: head atraumatic, normocephalic, pupils equal and reactive  to light,, neck supple Cardiovascular: Normal rate, regular rhythm and normal heart sounds.  No murmur heard. Trace  BLE edema, stasis dermatitis near ankles. Pulmonary/Chest: Effort normal and breath sounds normal. No respiratory distress. Abdominal: Soft.  There is no tenderness. Psychiatric: Patient has a normal mood and affect. behavior is normal. Judgment and thought content normal.   Vitals:   04/05/24 1402  BP: 130/76  Pulse: 92  Resp: 16  SpO2: 95%  Weight: 134 lb 9.6 oz (61.1 kg)  Height: 5\' 1"  (1.549 m)    Body mass index is 25.43 kg/m.   PHQ2/9:    04/05/2024    2:02 PM 03/30/2024    1:15 PM 10/22/2023   10:51 AM 04/20/2023   10:49 AM 04/02/2023   10:25 AM  Depression screen PHQ 2/9  Decreased Interest 1 1 0 0 0  Down, Depressed, Hopeless 1 1 0 0 0  PHQ - 2 Score 2 2 0 0 0  Altered sleeping 0 0 0 0   Tired, decreased energy 0 0 0 0   Change in appetite 0 0 0 0   Feeling bad or failure about yourself  0 0 0 0   Trouble concentrating 0 0 0 0   Moving slowly or fidgety/restless 0 0 0 0   Suicidal thoughts 0 0 0 0   PHQ-9 Score 2 2 0 0   Difficult doing work/chores Not difficult at all Not difficult at all       phq 9 is positive  Fall Risk:    03/30/2024    1:20 PM 10/22/2023   10:51 AM 04/20/2023   10:49 AM 04/02/2023   10:25 AM 03/25/2023    2:10 PM  Fall Risk   Falls in the past year? 1 0 1 0 0  Number falls in past yr: 0 0 0 0 0  Injury with Fall? 0 0 0 0 0  Risk for fall due to : History of fall(s);Impaired mobility No Fall Risks Impaired balance/gait  No Fall Risks  Follow up Falls prevention discussed;Falls evaluation completed Falls prevention discussed Falls prevention discussed  Education provided;Falls prevention discussed     Assessment & Plan  1. Angina at rest [facility]) (Primary)  Under the care of Cardiologist  2. Mild episode of recurrent major depressive disorder (HCC)  stable  3. Gastroesophageal reflux disease without esophagitis  -  esomeprazole (NEXIUM) 20 MG capsule; Take 1 capsule (20 mg total) by mouth daily before breakfast.  Dispense: 90 capsule; Refill: 1  4. History of iron deficiency anemia  - CBC with Differential/Platelet  5. Vitamin D deficiency  - VITAMIN D 25 Hydroxy (Vit-D Deficiency, Fractures)  6. Stasis dermatitis of both legs  Doing better with compression stocking hoses  7. Cervical radiculopathy  - Ambulatory referral to Orthopedic Surgery  8. Hypertriglyceridemia  - Lipid panel  9. Menopausal osteoporosis  She does not want to see Endo at this time  10. Tension headache  - butalbital-acetaminophen-caffeine (FIORICET) 50-325-40 MG tablet; Take 1 tablet by mouth every 6 (six) hours as needed.  Dispense: 60 tablet; Refill: 0  11. AVM (arteriovenous malformation) of small bowel, acquired  We will recheck CBC  12. B12 deficiency  - B12 and Folate Panel  13. Long-term use of high-risk medication  - Comprehensive metabolic panel with GFR

## 2024-05-16 ENCOUNTER — Encounter (INDEPENDENT_AMBULATORY_CARE_PROVIDER_SITE_OTHER): Payer: Self-pay

## 2024-07-05 ENCOUNTER — Ambulatory Visit: Payer: Medicare PPO | Admitting: Dermatology

## 2024-07-06 ENCOUNTER — Other Ambulatory Visit: Payer: Self-pay | Admitting: Neurology

## 2024-07-06 DIAGNOSIS — R270 Ataxia, unspecified: Secondary | ICD-10-CM

## 2024-07-06 DIAGNOSIS — R2689 Other abnormalities of gait and mobility: Secondary | ICD-10-CM

## 2024-07-07 ENCOUNTER — Encounter: Payer: Self-pay | Admitting: Neurology

## 2024-07-13 ENCOUNTER — Ambulatory Visit: Admitting: Dermatology

## 2024-07-13 DIAGNOSIS — Z7189 Other specified counseling: Secondary | ICD-10-CM

## 2024-07-13 DIAGNOSIS — L820 Inflamed seborrheic keratosis: Secondary | ICD-10-CM

## 2024-07-13 DIAGNOSIS — L814 Other melanin hyperpigmentation: Secondary | ICD-10-CM

## 2024-07-13 DIAGNOSIS — Z1283 Encounter for screening for malignant neoplasm of skin: Secondary | ICD-10-CM | POA: Diagnosis not present

## 2024-07-13 DIAGNOSIS — Z85820 Personal history of malignant melanoma of skin: Secondary | ICD-10-CM

## 2024-07-13 DIAGNOSIS — L578 Other skin changes due to chronic exposure to nonionizing radiation: Secondary | ICD-10-CM | POA: Diagnosis not present

## 2024-07-13 DIAGNOSIS — L817 Pigmented purpuric dermatosis: Secondary | ICD-10-CM

## 2024-07-13 DIAGNOSIS — W908XXA Exposure to other nonionizing radiation, initial encounter: Secondary | ICD-10-CM | POA: Diagnosis not present

## 2024-07-13 DIAGNOSIS — D229 Melanocytic nevi, unspecified: Secondary | ICD-10-CM

## 2024-07-13 DIAGNOSIS — Z86018 Personal history of other benign neoplasm: Secondary | ICD-10-CM

## 2024-07-13 DIAGNOSIS — Z79899 Other long term (current) drug therapy: Secondary | ICD-10-CM

## 2024-07-13 DIAGNOSIS — I872 Venous insufficiency (chronic) (peripheral): Secondary | ICD-10-CM

## 2024-07-13 MED ORDER — TRIAMCINOLONE ACETONIDE 0.1 % EX CREA: TOPICAL_CREAM | CUTANEOUS | 11 refills | Status: AC

## 2024-07-13 NOTE — Progress Notes (Signed)
 Follow-Up Visit   Subjective  Christina Salas is a 89 y.o. female who presents for the following: Skin Cancer Screening and Full Body Skin Exam  The patient presents for Total-Body Skin Exam (TBSE) for skin cancer screening and mole check. The patient has spots, moles and lesions to be evaluated, some may be new or changing and the patient may have concern these could be cancer.   The following portions of the chart were reviewed this encounter and updated as appropriate: medications, allergies, medical history  Review of Systems:  No other skin or systemic complaints except as noted in HPI or Assessment and Plan.  Objective  Well appearing patient in no apparent distress; mood and affect are within normal limits.  A full examination was performed including scalp, head, eyes, ears, nose, lips, neck, chest, axillae, abdomen, back, buttocks, bilateral upper extremities, bilateral lower extremities, hands, feet, fingers, toes, fingernails, and toenails. All findings within normal limits unless otherwise noted below.   Relevant physical exam findings are noted in the Assessment and Plan.  Chest x 3 (3) Erythematous keratotic or waxy stuck-on papule or plaque.  Assessment & Plan   SKIN CANCER SCREENING PERFORMED TODAY.  ACTINIC DAMAGE - Chronic condition, secondary to cumulative UV/sun exposure - diffuse scaly erythematous macules with underlying dyspigmentation - Recommend daily broad spectrum sunscreen SPF 30+ to sun-exposed areas, reapply every 2 hours as needed.  - Staying in the shade or wearing long sleeves, sun glasses (UVA+UVB protection) and wide brim hats (4-inch brim around the entire circumference of the hat) are also recommended for sun protection.  - Call for new or changing lesions.  LENTIGINES, SEBORRHEIC KERATOSES, HEMANGIOMAS - Benign normal skin lesions - Benign-appearing - Call for any changes  MELANOCYTIC NEVI - Tan-brown and/or pink-flesh-colored symmetric  macules and papules - Benign appearing on exam today - Observation - Call clinic for new or changing moles - Recommend daily use of broad spectrum spf 30+ sunscreen to sun-exposed areas.   HISTORY OF MELANOMA - No evidence of recurrence today - No lymphadenopathy - Recommend regular full body skin exams - Recommend daily broad spectrum sunscreen SPF 30+ to sun-exposed areas, reapply every 2 hours as needed.  - Call if any new or changing lesions are noted between office visits  HISTORY OF DYSPLASTIC NEVI No evidence of recurrence today Recommend regular full body skin exams Recommend daily broad spectrum sunscreen SPF 30+ to sun-exposed areas, reapply every 2 hours as needed.  Call if any new or changing lesions are noted between office visits  Hx of Breast Cancer - No LAD  STASIS DERMATITIS with shamberg's purpura Exam: Erythematous, scaly patches involving the ankle and distal lower leg with associated lower leg edema.  Chronic and persistent condition with duration or expected duration over one year. Condition is bothersome/symptomatic for patient. Currently flared.  Stasis in the legs causes chronic leg swelling, which may result in itchy or painful rashes, skin discoloration, skin texture changes, and sometimes ulceration.  Recommend daily graduated compression hose/stockings- easiest to put on first thing in morning, remove at bedtime.  Elevate legs as much as possible. Avoid salt/sodium rich foods.  Treatment Plan: Continue TMC0.1% cream to aa's QD-BID PRN. Topical steroids (such as triamcinolone , fluocinolone, fluocinonide, mometasone, clobetasol, halobetasol, betamethasone, hydrocortisone) can cause thinning and lightening of the skin if they are used for too long in the same area. Your physician has selected the right strength medicine for your problem and area affected on the body. Please use  your medication only as directed by your physician to prevent side effects.   INFLAMED SEBORRHEIC KERATOSIS (3) Chest x 3 (3) Symptomatic, irritating, patient would like treated. Destruction of lesion - Chest x 3 (3) Complexity: simple   Destruction method: cryotherapy   Informed consent: discussed and consent obtained   Timeout:  patient name, date of birth, surgical site, and procedure verified Lesion destroyed using liquid nitrogen: Yes   Region frozen until ice ball extended beyond lesion: Yes   Outcome: patient tolerated procedure well with no complications   Post-procedure details: wound care instructions given     Return in about 1 year (around 07/13/2025) for TBSE - hx MM, dysplastic nevi.  Christina Salas, CMA, am acting as scribe for Alm Rhyme, MD .  Documentation: I have reviewed the above documentation for accuracy and completeness, and I agree with the above.  Alm Rhyme, MD

## 2024-07-13 NOTE — Patient Instructions (Signed)

## 2024-07-18 ENCOUNTER — Encounter: Payer: Self-pay | Admitting: Dermatology

## 2024-08-13 ENCOUNTER — Ambulatory Visit
Admission: RE | Admit: 2024-08-13 | Discharge: 2024-08-13 | Disposition: A | Discharge status: Home / Self Care | Source: Ambulatory Visit | Attending: Neurology | Admitting: Neurology

## 2024-08-13 DIAGNOSIS — R270 Ataxia, unspecified: Secondary | ICD-10-CM

## 2024-08-13 DIAGNOSIS — R2689 Other abnormalities of gait and mobility: Secondary | ICD-10-CM

## 2024-08-13 DIAGNOSIS — I639 Cerebral infarction, unspecified: Secondary | ICD-10-CM | POA: Diagnosis not present

## 2024-08-23 ENCOUNTER — Telehealth: Payer: Self-pay

## 2024-08-23 DIAGNOSIS — R2681 Unsteadiness on feet: Secondary | ICD-10-CM | POA: Diagnosis not present

## 2024-08-23 DIAGNOSIS — R278 Other lack of coordination: Secondary | ICD-10-CM | POA: Diagnosis not present

## 2024-08-23 DIAGNOSIS — G479 Sleep disorder, unspecified: Secondary | ICD-10-CM | POA: Diagnosis not present

## 2024-08-23 DIAGNOSIS — R296 Repeated falls: Secondary | ICD-10-CM | POA: Diagnosis not present

## 2024-08-23 NOTE — Telephone Encounter (Signed)
 Copied from CRM [id number]. Topic: Clinical - Medical Advice >> Aug 23, 2024  8:57 AM Christina Salas wrote: Reason for CRM: The patient called in stating a lot has happened since she last saw her provider. She says she has been to [facility] to see a Dr Dow as that is who they said specializes in what was wrong with her and her neck pain. She had started PT rehab for her neck with 1 session but has not gone back for that since other things have happened. She also has seen a Dr Lane Neurologist with [facility]. He ordered a MRI of the Brain and it was discovered she had a small stroke. She has been advised she needs to start taking aspirin  81 mg again. She stopped taking that a while ago but she would like her providers take on having to go back on it Please assist patient further.

## 2024-08-23 NOTE — Telephone Encounter (Signed)
 Pt has an appt scheduled in October, you want her to make an appt for what is going on?

## 2024-08-25 ENCOUNTER — Ambulatory Visit (INDEPENDENT_AMBULATORY_CARE_PROVIDER_SITE_OTHER): Admitting: Family Medicine

## 2024-08-25 VITALS — BP 124/68 | HR 82 | Resp 16 | Ht 61.0 in | Wt 127.6 lb

## 2024-08-25 DIAGNOSIS — Z8673 Personal history of transient ischemic attack (TIA), and cerebral infarction without residual deficits: Secondary | ICD-10-CM

## 2024-08-25 DIAGNOSIS — K5520 Angiodysplasia of colon without hemorrhage: Secondary | ICD-10-CM

## 2024-08-25 DIAGNOSIS — G319 Degenerative disease of nervous system, unspecified: Secondary | ICD-10-CM | POA: Diagnosis not present

## 2024-08-25 DIAGNOSIS — Z23 Encounter for immunization: Secondary | ICD-10-CM | POA: Diagnosis not present

## 2024-08-25 DIAGNOSIS — I6389 Other cerebral infarction: Secondary | ICD-10-CM

## 2024-08-25 DIAGNOSIS — G959 Disease of spinal cord, unspecified: Secondary | ICD-10-CM

## 2024-08-25 DIAGNOSIS — R296 Repeated falls: Secondary | ICD-10-CM | POA: Diagnosis not present

## 2024-08-25 NOTE — Progress Notes (Signed)
 Name: Christina Salas   MRN: [id number]    DOB: 12/26/1933   Date:08/25/2024       Progress Note  Subjective  Chief Complaint  Chief Complaint  Patient presents with   medication question    Pt states she wants to know if should be on Aspirin    Discussed the use of AI scribe software for clinical note transcription with the patient, who gave verbal consent to proceed.  History of Present Illness Christina Salas is an 89 year old female with a history of falls and a recent stroke who presents for follow-up on her neurological symptoms and medication management.  She has experienced three falls over the past two years, with the most recent occurring earlier this year. She describes a sudden sensation preceding the falls, which is not painful but coincides with the fall. She denies any pain associated with the falls.  An MRI performed on August 17th revealed brain atrophy and a small infarct in the white matter. She was unaware of having had a stroke prior to this diagnosis. She started taking rosuvastatin two days ago.  She has a history of arteriovenous malformation (AVM) and previous anemia, which has influenced her current medication regimen.  She uses a cane for balance due to myelopathy and neck issues, which she believes affect her balance.  She is currently taking rosuvastatin and continues with her other medications. No visible blood in stools, indicating no overt bleeding.    Patient Active Problem List   Diagnosis Date Noted   Hypertension    Lymphedema 07/13/2023   Major depressive disorder, single episode, mild (HCC) 04/20/2023   Menopausal osteoporosis 10/16/2022   History of iron  deficiency anemia 10/16/2022   AVM (arteriovenous malformation) of small bowel, acquired 10/16/2022   Neuropathy 10/16/2022   Stricture and stenosis of esophagus    Large hiatal hernia 01/24/2019   Internal hemorrhoids 01/24/2019   Iron  deficiency anemia due to chronic blood  loss    Venous reflux 04/19/2018   Prediabetes 06/08/2016   Obesity (BMI 30.0-34.9) 06/08/2016   Vitamin D  deficiency 06/08/2016   Post-menopausal 12/11/2015   DD (diverticular disease) 06/19/2015   Esophageal reflux 06/19/2015   HLD (hyperlipidemia) 06/19/2015   Varicose veins of leg with swelling, bilateral 06/19/2015   Recurrent UTI 02/08/2013   Mixed incontinence 02/08/2013   History of breast cancer, left, T1, N0, Left MRM - Dec 1996 03/18/2012   Tension headache 05/24/2007   History of melanoma 08/28/1994    Past Surgical History:  Procedure Laterality Date   ABDOMINAL HYSTERECTOMY  2001   BREAST BIOPSY  2010   BREAST EXCISIONAL BIOPSY Right 1997   Benign excisional biopsy    CATARACT EXTRACTION Left 01/30/2021   Dr. Wanda Mae   COLONOSCOPY  04/02/2014   COLONOSCOPY WITH PROPOFOL  N/A 12/27/2018   Procedure: COLONOSCOPY WITH PROPOFOL ;  Surgeon: Jinny Carmine, MD;  Location: [facility] ENDOSCOPY;  Service: Endoscopy;  Laterality: N/A;   CYSTOSCOPY  11/2014   ESOPHAGOGASTRODUODENOSCOPY (EGD) WITH PROPOFOL  N/A 12/27/2018   Procedure: ESOPHAGOGASTRODUODENOSCOPY (EGD) WITH PROPOFOL ;  Surgeon: Jinny Carmine, MD;  Location: [facility] ENDOSCOPY;  Service: Endoscopy;  Laterality: N/A;   ESOPHAGOGASTRODUODENOSCOPY (EGD) WITH PROPOFOL  N/A 01/07/2021   Procedure: ESOPHAGOGASTRODUODENOSCOPY (EGD) WITH PROPOFOL ;  Surgeon: Jinny Carmine, MD;  Location: [facility] ENDOSCOPY;  Service: Endoscopy;  Laterality: N/A;   GIVENS CAPSULE STUDY N/A 01/07/2021   Procedure: GIVENS CAPSULE STUDY;  Surgeon: Jinny Carmine, MD;  Location: [facility] ENDOSCOPY;  Service: Endoscopy;  Laterality: N/A;  MASTECTOMY  1996   Left   MELANOMA EXCISION  1995   Head    Family History  Problem Relation Age of Onset   Breast cancer Mother    Migraines Mother    Diabetes Father    CAD Father    Thrombosis Father    Aortic aneurysm Sister    Biliary Cirrhosis Brother    Breast cancer Other        Niece    Social History    Tobacco Use   Smoking status: Never   Smokeless tobacco: Never  Substance Use Topics   Alcohol use: No    Alcohol/week: 0.0 standard drinks of alcohol     Current Outpatient Medications:    aspirin  EC 81 MG tablet, Take 81 mg by mouth once., Disp: , Rfl:    butalbital -acetaminophen-caffeine  (FIORICET) 50-325-40 MG tablet, Take 1 tablet by mouth every 6 (six) hours as needed., Disp: 60 tablet, Rfl: 0   Calcium-Magnesium  500-250 MG TABS, Take 1 tablet by mouth 2 (two) times daily., Disp: , Rfl:    CINNAMON PO, Take 1 capsule by mouth daily. 350 mg, Disp: , Rfl:    Cranberry 500 MG CAPS, Take 1 tablet by mouth daily., Disp: , Rfl:    esomeprazole  (NEXIUM ) 20 MG capsule, Take 1 capsule (20 mg total) by mouth daily before breakfast., Disp: 90 capsule, Rfl: 1   ferrous sulfate  (FEROSUL) 325 (65 FE) MG tablet, Take 1 tablet (325 mg total) by mouth daily with breakfast., Disp: 90 tablet, Rfl: 1   Misc Natural Products (LEG VEIN & CIRCULATION) TABS, Take 2 capsules by mouth daily., Disp: , Rfl:    Multiple Vitamins-Calcium (ESSENTIAL ONE DAILY MULTIVIT) TABS, Take by mouth., Disp: , Rfl:    mupirocin  ointment (BACTROBAN ) 2 %, Apply 1 Application topically daily., Disp: 22 g, Rfl: 0   Omega-3 Fatty Acids (FISH OIL) 1000 MG CAPS, Take by mouth., Disp: , Rfl:    rosuvastatin (CRESTOR) 20 MG tablet, Take 20 mg by mouth daily., Disp: , Rfl:    triamcinolone  cream (KENALOG ) 0.1 %, Apply to lower legs QD-BID up to 5d/wk. Avoid applying to face, groin, and axilla. Use as directed. Long-term use can cause thinning of the skin., Disp: 80 g, Rfl: 11  No Known Allergies  I personally reviewed active problem list, medication list, allergies, family history with the patient/caregiver today.   ROS  Ten systems reviewed and is negative except as mentioned in HPI    Objective Physical Exam CONSTITUTIONAL: Patient appears well-developed and well-nourished.  No distress. HEENT: Head atraumatic,  normocephalic, neck supple. CARDIOVASCULAR: Normal rate, regular rhythm and normal heart sounds.  No murmur heard. No BLE edema. PULMONARY: Effort normal and breath sounds normal. No respiratory distress. ABDOMINAL: There is no tenderness or distention. MUSCULOSKELETAL: using a cane , slow gait , decrease rom of neck  PSYCHIATRIC: Patient has a normal mood and affect. behavior is normal. Judgment and thought content normal.  Vitals:   08/25/24 1523  BP: 124/68  Pulse: 82  Resp: 16  SpO2: 95%  Weight: 127 lb 9.6 oz (57.9 kg)  Height: 5' 1 (1.549 m)    Body mass index is 24.11 kg/m.    PHQ2/9:    08/25/2024    3:20 PM 04/05/2024    2:02 PM 03/30/2024    1:15 PM 10/22/2023   10:51 AM 04/20/2023   10:49 AM  Depression screen PHQ 2/9  Decreased Interest 1 1 1  0 0  Down, Depressed, Hopeless  1 1 1  0 0  PHQ - 2 Score 2 2 2  0 0  Altered sleeping 1 0 0 0 0  Tired, decreased energy 0 0 0 0 0  Change in appetite 0 0 0 0 0  Feeling bad or failure about yourself  0 0 0 0 0  Trouble concentrating 1 0 0 0 0  Moving slowly or fidgety/restless 0 0 0 0 0  Suicidal thoughts 0 0 0 0 0  PHQ-9 Score 4 2 2  0 0  Difficult doing work/chores Somewhat difficult Not difficult at all Not difficult at all      phq 9 is positive  Fall Risk:    08/25/2024    3:20 PM 03/30/2024    1:20 PM 10/22/2023   10:51 AM 04/20/2023   10:49 AM 04/02/2023   10:25 AM  Fall Risk   Falls in the past year? 1 1 0 1 0  Number falls in past yr: 0 0 0 0 0  Injury with Fall? 0 0 0 0 0  Risk for fall due to : Impaired balance/gait History of fall(s);Impaired mobility No Fall Risks Impaired balance/gait   Follow up Falls evaluation completed Falls prevention discussed;Falls evaluation completed Falls prevention discussed Falls prevention discussed      Assessment & Plan Recurrent falls Recurrent falls possibly related to brain atrophy and small infarct. - Continue physical therapy to improve strength and  balance.  History of small vessel cerebral infarct with cerebral atrophy MRI showed brain atrophy and small infarct. Neurologist cleared her to drive. Rosuvastatin recommended, aspirin  avoided due to bleeding risk. - Continue rosuvastatin for stroke prevention. - Avoid aspirin  due to risk of bleeding from arteriovenous malformation.  Cervical myelopathy Cervical myelopathy affects balance. Surgery deemed too risky. Physical therapy advised. - Continue physical therapy for cervical myelopathy.  Coronary artery disease Coronary artery disease managed with rosuvastatin. Aspirin  not recommended due to bleeding risk. - Continue rosuvastatin for coronary artery disease. - Avoid aspirin  due to bleeding risk.  Arteriovenous malformation of colon with history of bleeding Arteriovenous malformation in colon with bleeding history. Aspirin  avoided due to bleeding risk. - Avoid aspirin  to reduce bleeding risk.  Hyperlipidemia Hyperlipidemia managed with rosuvastatin. Blood work pending to assess cholesterol levels. - Continue rosuvastatin. - Perform blood work in six weeks to assess cholesterol levels.  General Health Maintenance Immunizations updated. Pneumonia vaccine given. Flu shot scheduled. Tdap and shingles vaccines recommended. - Administer pneumonia vaccine today. - Schedule flu shot in six weeks. - Recommend Tdap and shingles vaccines.

## 2024-08-26 ENCOUNTER — Encounter: Payer: Self-pay | Admitting: Family Medicine

## 2024-08-26 DIAGNOSIS — R296 Repeated falls: Secondary | ICD-10-CM | POA: Insufficient documentation

## 2024-08-26 DIAGNOSIS — Z8673 Personal history of transient ischemic attack (TIA), and cerebral infarction without residual deficits: Secondary | ICD-10-CM | POA: Insufficient documentation

## 2024-08-26 DIAGNOSIS — G959 Disease of spinal cord, unspecified: Secondary | ICD-10-CM | POA: Insufficient documentation

## 2024-08-26 DIAGNOSIS — G319 Degenerative disease of nervous system, unspecified: Secondary | ICD-10-CM | POA: Insufficient documentation

## 2024-09-08 DIAGNOSIS — H40013 Open angle with borderline findings, low risk, bilateral: Secondary | ICD-10-CM | POA: Diagnosis not present

## 2024-09-11 ENCOUNTER — Telehealth: Payer: Self-pay

## 2024-09-11 NOTE — Telephone Encounter (Signed)
 Copied from CRM [id number]. Topic: Clinical - Request for Lab/Test Order >> Sep 11, 2024  3:51 PM Pinkey ORN wrote: Reason for CRM: Bone Density >> Sep 11, 2024  3:51 PM Pinkey ORN wrote: Patient is requesting to have an order put in place for a bone density.

## 2024-09-11 NOTE — Telephone Encounter (Signed)
 Order was already in and its scheduled for 09/14/24

## 2024-09-12 ENCOUNTER — Other Ambulatory Visit: Payer: Self-pay | Admitting: Family Medicine

## 2024-09-12 ENCOUNTER — Ambulatory Visit (INDEPENDENT_AMBULATORY_CARE_PROVIDER_SITE_OTHER): Admitting: Vascular Surgery

## 2024-09-12 DIAGNOSIS — Z1231 Encounter for screening mammogram for malignant neoplasm of breast: Secondary | ICD-10-CM

## 2024-09-14 ENCOUNTER — Other Ambulatory Visit

## 2024-09-21 DIAGNOSIS — R8271 Bacteriuria: Secondary | ICD-10-CM | POA: Diagnosis not present

## 2024-09-21 DIAGNOSIS — N3946 Mixed incontinence: Secondary | ICD-10-CM | POA: Diagnosis not present

## 2024-09-21 DIAGNOSIS — R3121 Asymptomatic microscopic hematuria: Secondary | ICD-10-CM | POA: Diagnosis not present

## 2024-10-04 ENCOUNTER — Ambulatory Visit
Admission: RE | Admit: 2024-10-04 | Discharge: 2024-10-04 | Disposition: A | Discharge status: Home / Self Care | Source: Ambulatory Visit | Attending: Family Medicine | Admitting: Family Medicine

## 2024-10-04 ENCOUNTER — Ambulatory Visit: Payer: Self-pay | Admitting: Family Medicine

## 2024-10-04 ENCOUNTER — Telehealth: Payer: Self-pay | Admitting: Family Medicine

## 2024-10-04 DIAGNOSIS — M810 Age-related osteoporosis without current pathological fracture: Secondary | ICD-10-CM

## 2024-10-04 DIAGNOSIS — Z1231 Encounter for screening mammogram for malignant neoplasm of breast: Secondary | ICD-10-CM | POA: Insufficient documentation

## 2024-10-04 NOTE — Telephone Encounter (Unsigned)
 Copied from CRM #[id number]. Topic: General - Other >> Oct 04, 2024  3:37 PM Zebedee SAUNDERS wrote: Reason for CRM: Pt has appt on Fri Oct 10th at 1:20 p.m. and would like a appt in the morning since pt has labs. Dr. Glenard does not have an appt until Dec. Pt wants to know if it's okay with Dr. to wait that long. Pt wants a call back at [phone].

## 2024-10-05 ENCOUNTER — Other Ambulatory Visit

## 2024-10-05 ENCOUNTER — Encounter

## 2024-10-05 NOTE — Telephone Encounter (Signed)
 Left detailed vm

## 2024-10-06 ENCOUNTER — Ambulatory Visit: Admitting: Family Medicine

## 2024-10-06 ENCOUNTER — Encounter: Payer: Self-pay | Admitting: Family Medicine

## 2024-10-06 VITALS — BP 126/68 | HR 78 | Resp 16 | Ht 61.0 in | Wt 126.2 lb

## 2024-10-06 DIAGNOSIS — E538 Deficiency of other specified B group vitamins: Secondary | ICD-10-CM

## 2024-10-06 DIAGNOSIS — F330 Major depressive disorder, recurrent, mild: Secondary | ICD-10-CM

## 2024-10-06 DIAGNOSIS — K219 Gastro-esophageal reflux disease without esophagitis: Secondary | ICD-10-CM

## 2024-10-06 DIAGNOSIS — Z862 Personal history of diseases of the blood and blood-forming organs and certain disorders involving the immune mechanism: Secondary | ICD-10-CM

## 2024-10-06 DIAGNOSIS — G319 Degenerative disease of nervous system, unspecified: Secondary | ICD-10-CM

## 2024-10-06 DIAGNOSIS — E559 Vitamin D deficiency, unspecified: Secondary | ICD-10-CM

## 2024-10-06 DIAGNOSIS — G44209 Tension-type headache, unspecified, not intractable: Secondary | ICD-10-CM

## 2024-10-06 DIAGNOSIS — E781 Pure hyperglyceridemia: Secondary | ICD-10-CM | POA: Diagnosis not present

## 2024-10-06 DIAGNOSIS — I872 Venous insufficiency (chronic) (peripheral): Secondary | ICD-10-CM

## 2024-10-06 DIAGNOSIS — Z23 Encounter for immunization: Secondary | ICD-10-CM

## 2024-10-06 DIAGNOSIS — G959 Disease of spinal cord, unspecified: Secondary | ICD-10-CM

## 2024-10-06 DIAGNOSIS — K5520 Angiodysplasia of colon without hemorrhage: Secondary | ICD-10-CM | POA: Diagnosis not present

## 2024-10-06 DIAGNOSIS — Z8673 Personal history of transient ischemic attack (TIA), and cerebral infarction without residual deficits: Secondary | ICD-10-CM | POA: Diagnosis not present

## 2024-10-06 DIAGNOSIS — Z79899 Other long term (current) drug therapy: Secondary | ICD-10-CM | POA: Diagnosis not present

## 2024-10-06 MED ORDER — BUTALBITAL-APAP-CAFFEINE 50-325-40 MG PO TABS: 1.0000 | ORAL_TABLET | Freq: Four times a day (QID) | ORAL | 0 refills | Status: AC | PRN

## 2024-10-06 MED ORDER — ESOMEPRAZOLE MAGNESIUM 20 MG PO CPDR: 20.0000 mg | DELAYED_RELEASE_CAPSULE | Freq: Every day | ORAL | 1 refills | Status: AC

## 2024-10-06 NOTE — Progress Notes (Signed)
 Name: Christina Salas   MRN: [id number]    DOB: 1933-05-06   Date:10/06/2024       Progress Note  Subjective  Chief Complaint  Chief Complaint  Patient presents with   Medical Management of Chronic Issues   Discussed the use of AI scribe software for clinical note transcription with the patient, who gave verbal consent to proceed.  History of Present Illness Christina Salas is an 89 year old female who presents for a regular follow-up visit.  She has been feeling generally well since her last visit. She is scheduled for blood work today to check vitamin B12, folate, and iron  levels due to her history of deficiencies. She takes a multivitamin containing B12 and 2000 IU of vitamin D  daily. She has a history of iron  deficiency anemia, and her iron  levels have not been checked in two years. She has a history of arterial venous malformation in the small bowel, which requires monitoring.  She experiences occasional nausea, initially linked to starting cholesterol medication. She takes Nexium  20 mg for reflux and occasionally takes an extra dose if needed. Nausea was present when she first started cholesterol medication but improved after switching to taking it at night.  She has a history of stroke, discovered after a fall and subsequent imaging in July. She takes aspirin  81 mg and rosuvastatin 20 mg. She uses a cane for steady gait and is undergoing physical therapy for cervical myelopathy. She reports memory problems, such as difficulty recalling names, but is unsure if this is related to her stroke. She engages in activities like puzzles to keep her mind active. She has a history of falls, with the last significant fall occurring in the winter prior to her stroke diagnosis. She is currently undergoing physical therapy for her neck and has four more sessions remaining.  She has a history of stasis dermatitis and is seeing a vascular doctor. She uses compression stockings, which she  finds helpful. She reports no significant pain but struggles with balance. She takes Fioricet for tension headaches and has about 20 pills left from a previous prescription of 60.  She has a history of recurrent UTIs and recently had an ultrasound revealing a kidney stone. She is scheduled for a CT scan to further evaluate the stone. She feels a bit down and has a PHQ-9 score of 4, indicating mild depressive symptoms. She lives alone and still drives occasionally.    Patient Active Problem List   Diagnosis Date Noted   Cervical myelopathy (HCC) 08/26/2024   Recurrent falls 08/26/2024   Brain atrophy 08/26/2024   History of stroke in adulthood 08/26/2024   Hypertension    Lymphedema 07/13/2023   Major depressive disorder, single episode, mild 04/20/2023   Menopausal osteoporosis 10/16/2022   History of iron  deficiency anemia 10/16/2022   AVM (arteriovenous malformation) of small bowel, acquired 10/16/2022   Neuropathy 10/16/2022   Stricture and stenosis of esophagus    Large hiatal hernia 01/24/2019   Internal hemorrhoids 01/24/2019   Iron  deficiency anemia due to chronic blood loss    Venous reflux 04/19/2018   Prediabetes 06/08/2016   Obesity (BMI 30.0-34.9) 06/08/2016   Vitamin D  deficiency 06/08/2016   Post-menopausal 12/11/2015   DD (diverticular disease) 06/19/2015   Esophageal reflux 06/19/2015   HLD (hyperlipidemia) 06/19/2015   Varicose veins of leg with swelling, bilateral 06/19/2015   Recurrent UTI 02/08/2013   Mixed incontinence 02/08/2013   History of breast cancer, left, T1, N0, Left MRM -  Dec 1996 03/18/2012   Tension headache 05/24/2007   History of melanoma 08/28/1994    Past Surgical History:  Procedure Laterality Date   ABDOMINAL HYSTERECTOMY  2001   BREAST BIOPSY  2010   BREAST EXCISIONAL BIOPSY Right 1997   Benign excisional biopsy    CATARACT EXTRACTION Left 01/30/2021   Dr. Wanda Mae   COLONOSCOPY  04/02/2014   COLONOSCOPY WITH PROPOFOL  N/A  12/27/2018   Procedure: COLONOSCOPY WITH PROPOFOL ;  Surgeon: Jinny Carmine, MD;  Location: [facility] ENDOSCOPY;  Service: Endoscopy;  Laterality: N/A;   CYSTOSCOPY  11/2014   ESOPHAGOGASTRODUODENOSCOPY (EGD) WITH PROPOFOL  N/A 12/27/2018   Procedure: ESOPHAGOGASTRODUODENOSCOPY (EGD) WITH PROPOFOL ;  Surgeon: Jinny Carmine, MD;  Location: [facility] ENDOSCOPY;  Service: Endoscopy;  Laterality: N/A;   ESOPHAGOGASTRODUODENOSCOPY (EGD) WITH PROPOFOL  N/A 01/07/2021   Procedure: ESOPHAGOGASTRODUODENOSCOPY (EGD) WITH PROPOFOL ;  Surgeon: Jinny Carmine, MD;  Location: [facility] ENDOSCOPY;  Service: Endoscopy;  Laterality: N/A;   GIVENS CAPSULE STUDY N/A 01/07/2021   Procedure: GIVENS CAPSULE STUDY;  Surgeon: Jinny Carmine, MD;  Location: [facility] ENDOSCOPY;  Service: Endoscopy;  Laterality: N/A;   MASTECTOMY  1996   Left   MELANOMA EXCISION  1995   Head    Family History  Problem Relation Age of Onset   Breast cancer Mother    Migraines Mother    Diabetes Father    CAD Father    Thrombosis Father    Aortic aneurysm Sister    Biliary Cirrhosis Brother    Breast cancer Other        Niece    Social History   Tobacco Use   Smoking status: Never   Smokeless tobacco: Never  Substance Use Topics   Alcohol use: No    Alcohol/week: 0.0 standard drinks of alcohol     Current Outpatient Medications:    butalbital -acetaminophen-caffeine  (FIORICET) 50-325-40 MG tablet, Take 1 tablet by mouth every 6 (six) hours as needed., Disp: 60 tablet, Rfl: 0   Calcium-Magnesium  500-250 MG TABS, Take 1 tablet by mouth 2 (two) times daily., Disp: , Rfl:    CINNAMON PO, Take 1 capsule by mouth daily. 350 mg, Disp: , Rfl:    Cranberry 500 MG CAPS, Take 1 tablet by mouth daily., Disp: , Rfl:    esomeprazole  (NEXIUM ) 20 MG capsule, Take 1 capsule (20 mg total) by mouth daily before breakfast., Disp: 90 capsule, Rfl: 1   ferrous sulfate  (FEROSUL) 325 (65 FE) MG tablet, Take 1 tablet (325 mg total) by mouth daily with breakfast., Disp: 90  tablet, Rfl: 1   Misc Natural Products (LEG VEIN & CIRCULATION) TABS, Take 2 capsules by mouth daily., Disp: , Rfl:    Multiple Vitamins-Calcium (ESSENTIAL ONE DAILY MULTIVIT) TABS, Take by mouth., Disp: , Rfl:    mupirocin  ointment (BACTROBAN ) 2 %, Apply 1 Application topically daily., Disp: 22 g, Rfl: 0   Omega-3 Fatty Acids (FISH OIL) 1000 MG CAPS, Take by mouth., Disp: , Rfl:    rosuvastatin (CRESTOR) 20 MG tablet, Take 20 mg by mouth daily., Disp: , Rfl:    triamcinolone  cream (KENALOG ) 0.1 %, Apply to lower legs QD-BID up to 5d/wk. Avoid applying to face, groin, and axilla. Use as directed. Long-term use can cause thinning of the skin., Disp: 80 g, Rfl: 11  No Known Allergies  I personally reviewed active problem list, medication list, allergies with the patient/caregiver today.   ROS  Ten systems reviewed and is negative except as mentioned in HPI    Objective Physical Exam CONSTITUTIONAL:  Patient appears well-developed and well-nourished. No distress. HEENT: Head atraumatic, normocephalic, neck supple. CARDIOVASCULAR: Normal rate, regular rhythm and normal heart sounds. No murmur heard. No BLE edema. PULMONARY: Effort normal and breath sounds normal. Lungs clear to auscultation. No respiratory distress. ABDOMINAL: There is no tenderness or distention. MUSCULOSKELETAL: Normal gait. Without gross motor or sensory deficit. PSYCHIATRIC: Patient has a normal mood and affect. Behavior is normal. Judgment and thought content normal.  Vitals:   10/06/24 1320  BP: 126/68  Pulse: 78  Resp: 16  SpO2: 97%  Weight: 126 lb 3.2 oz (57.2 kg)  Height: 5' 1 (1.549 m)    Body mass index is 23.85 kg/m.   PHQ2/9:    10/06/2024    1:20 PM 08/25/2024    3:20 PM 04/05/2024    2:02 PM 03/30/2024    1:15 PM 10/22/2023   10:51 AM  Depression screen PHQ 2/9  Decreased Interest 1 1 1 1  0  Down, Depressed, Hopeless 1 1 1 1  0  PHQ - 2 Score 2 2 2 2  0  Altered sleeping 1 1 0 0 0  Tired,  decreased energy 0 0 0 0 0  Change in appetite 0 0 0 0 0  Feeling bad or failure about yourself  0 0 0 0 0  Trouble concentrating 1 1 0 0 0  Moving slowly or fidgety/restless 0 0 0 0 0  Suicidal thoughts 0 0 0 0 0  PHQ-9 Score 4 4 2 2  0  Difficult doing work/chores Somewhat difficult Somewhat difficult Not difficult at all Not difficult at all     phq 9 is positive  Fall Risk:    10/06/2024    1:15 PM 08/25/2024    3:20 PM 03/30/2024    1:20 PM 10/22/2023   10:51 AM 04/20/2023   10:49 AM  Fall Risk   Falls in the past year? 1 1 1  0 1  Number falls in past yr: 0 0 0 0 0  Injury with Fall? 0 0 0 0 0  Risk for fall due to : Impaired balance/gait Impaired balance/gait History of fall(s);Impaired mobility No Fall Risks Impaired balance/gait  Follow up Falls evaluation completed Falls evaluation completed Falls prevention discussed;Falls evaluation completed Falls prevention discussed Falls prevention discussed   Assessment & Plan Cervical myelopathy Balance issues persist despite no significant pain. - Continue physical therapy as recommended by Dr. Carrol.  Unsteady gait and sensory ataxia History of stroke with left parietal subcortical infarct. Using a cane for stability. - Continue physical therapy for neck issues to aid balance.  Stroke (left parietal subcortical infarct) without residual deficits No residual deficits. Continues on aspirin  and rosuvastatin as prescribed by neurologist. - Continue aspirin  and rosuvastatin as prescribed by neurologist.  Gastroesophageal reflux disease Occasional nausea and heartburn managed with Nexium  20 mg daily. - Refill Nexium  20 mg. - Advise use of Tums or Pepcid for breakthrough symptoms.  Tension headaches Occasional tension headaches managed with Fioricet. - Refill Fioricet 50/325/40 mg, 50 tablets.  Recurrent urinary tract infections and nephrolithiasis Under urology care with Dr. Donnice. Recent ultrasound identified a kidney  stone. Follow-up CT scan planned. - Follow up with urologist for CT scan results.  Iron  deficiency anemia with angiodysplasia of small bowel Monitoring necessary due to potential bleeding from small bowel angiodysplasia. - Order CBC and iron  studies.  Vitamin B12 deficiency Currently not taking a separate B12 supplement. B12 levels will be checked to determine if supplementation is needed. - Order B12 level. -  Advise sublingual or liquid B12 supplement if levels are low.  Vitamin D  deficiency Vitamin D  supplementation ongoing with 2000 IU daily. - Continue Vitamin D  supplementation with 2000 IU daily.  Stasis dermatitis (chronic venous insufficiency) Condition well-managed with compression stockings. - Continue use of compression stockings.  Depression mild chronic recurrent  Mild symptoms with a PHQ-9 score of 4. No current need for antidepressant medication. - Continue engaging in activities to maintain mental health.

## 2024-10-07 LAB — LIPID PANEL
Cholesterol: 110 mg/dL (ref <200)
HDL: 61 mg/dL (ref >=50)
LDL Cholesterol (Calc): 33 mg/dL
Non-HDL Cholesterol (Calc): 49 mg/dL (ref <130)
Total CHOL/HDL Ratio: 1.8 (calc) (ref <5.0)
Triglycerides: 79 mg/dL (ref <150)

## 2024-10-07 LAB — COMPREHENSIVE METABOLIC PANEL WITH GFR
AG Ratio: 1.4 (calc) (ref 1.0–2.5)
ALT: 18 U/L (ref 6–29)
AST: 20 U/L (ref 10–35)
Albumin: 4.2 g/dL (ref 3.6–5.1)
Alkaline phosphatase (APISO): 86 U/L (ref 37–153)
BUN: 23 mg/dL (ref 7–25)
CO2: 30 mmol/L (ref 20–32)
Calcium: 9.9 mg/dL (ref 8.6–10.4)
Chloride: 102 mmol/L (ref 98–110)
Creat: 0.88 mg/dL (ref 0.60–0.95)
Globulin: 3.1 g/dL (ref 1.9–3.7)
Glucose, Bld: 101 mg/dL — ABNORMAL HIGH (ref 65–99)
Potassium: 4.8 mmol/L (ref 3.5–5.3)
Sodium: 142 mmol/L (ref 135–146)
Total Bilirubin: 0.2 mg/dL (ref 0.2–1.2)
Total Protein: 7.3 g/dL (ref 6.1–8.1)
eGFR: 63 mL/min/1.73m2 (ref >=60)

## 2024-10-07 LAB — IRON,TIBC AND FERRITIN PANEL
%SAT: 16 % (ref 16–45)
Ferritin: 43 ng/mL (ref 16–288)
Iron: 54 ug/dL (ref 45–160)
TIBC: 331 ug/dL (ref 250–450)

## 2024-10-07 LAB — CBC WITH DIFFERENTIAL/PLATELET
Absolute Lymphocytes: 2117 {cells}/uL (ref 850–3900)
Absolute Monocytes: 273 {cells}/uL (ref 200–950)
Basophils Absolute: 29 {cells}/uL (ref 0–200)
Basophils Relative: 0.5 %
Eosinophils Absolute: 58 {cells}/uL (ref 15–500)
Eosinophils Relative: 1 %
HCT: 37.2 % (ref 35.0–45.0)
Hemoglobin: 12.2 g/dL (ref 11.7–15.5)
MCH: 29.6 pg (ref 27.0–33.0)
MCHC: 32.8 g/dL (ref 32.0–36.0)
MCV: 90.3 fL (ref 80.0–100.0)
MPV: 10.2 fL (ref 7.5–12.5)
Monocytes Relative: 4.7 %
Neutro Abs: 3323 {cells}/uL (ref 1500–7800)
Neutrophils Relative %: 57.3 %
Platelets: 202 Thousand/uL (ref 140–400)
RBC: 4.12 Million/uL (ref 3.80–5.10)
RDW: 12.8 % (ref 11.0–15.0)
Total Lymphocyte: 36.5 %
WBC: 5.8 Thousand/uL (ref 3.8–10.8)

## 2024-10-07 LAB — VITAMIN D 25 HYDROXY (VIT D DEFICIENCY, FRACTURES): Vit D, 25-Hydroxy: 61 ng/mL (ref 30–100)

## 2024-10-07 LAB — B12 AND FOLATE PANEL
Folate: >24 ng/mL
Vitamin B-12: 620 pg/mL (ref 200–1100)

## 2024-10-09 ENCOUNTER — Ambulatory Visit: Payer: Self-pay | Admitting: Family Medicine

## 2024-10-12 ENCOUNTER — Other Ambulatory Visit: Payer: Self-pay | Admitting: Urology

## 2024-10-12 DIAGNOSIS — K449 Diaphragmatic hernia without obstruction or gangrene: Secondary | ICD-10-CM | POA: Diagnosis not present

## 2024-10-12 DIAGNOSIS — N132 Hydronephrosis with renal and ureteral calculous obstruction: Secondary | ICD-10-CM | POA: Diagnosis not present

## 2024-10-12 DIAGNOSIS — R3121 Asymptomatic microscopic hematuria: Secondary | ICD-10-CM | POA: Diagnosis not present

## 2024-10-12 DIAGNOSIS — K8020 Calculus of gallbladder without cholecystitis without obstruction: Secondary | ICD-10-CM | POA: Diagnosis not present

## 2024-10-12 DIAGNOSIS — N3020 Other chronic cystitis without hematuria: Secondary | ICD-10-CM | POA: Diagnosis not present

## 2024-10-12 DIAGNOSIS — N201 Calculus of ureter: Secondary | ICD-10-CM | POA: Diagnosis not present

## 2024-10-12 DIAGNOSIS — K5730 Diverticulosis of large intestine without perforation or abscess without bleeding: Secondary | ICD-10-CM | POA: Diagnosis not present

## 2024-10-13 ENCOUNTER — Encounter (HOSPITAL_COMMUNITY): Payer: Self-pay | Admitting: Urology

## 2024-10-13 DIAGNOSIS — N201 Calculus of ureter: Secondary | ICD-10-CM | POA: Diagnosis not present

## 2024-10-13 NOTE — Progress Notes (Signed)
 [facility] PREOP PHONE CALL   ALLERGIES REVIEWED: YES  MEDICATION REVIEW DONE: YES MEDICATIONS THAT PT SHOULD HOLD (LIST): none  CAN PT WALK UP STAIRS WITHOUT SHORTNESS OF BREATH: YES HOME O2: NO CPAP: NO  IF YES, INFORMED PT TO BRING CPAP WITH TUBING AND MASK:YES/NO   INFORMED DRIVER NEEDED FOR PROCEDURE: YES   PT WAS GIVEN BLUE FOLDER AT UROLOGY APPT: YES PT INFORMED TO BRING BLUE FOLDER WITH ALL CONTENTS: YES  REVIEWED ARRIVAL TIME AND LOCATION: YES  OTHER PERTINENT INFORMATION:

## 2024-10-16 ENCOUNTER — Ambulatory Visit: Admitting: Podiatry

## 2024-10-16 DIAGNOSIS — R7303 Prediabetes: Secondary | ICD-10-CM

## 2024-10-16 DIAGNOSIS — M79674 Pain in right toe(s): Secondary | ICD-10-CM | POA: Diagnosis not present

## 2024-10-16 DIAGNOSIS — M2042 Other hammer toe(s) (acquired), left foot: Secondary | ICD-10-CM

## 2024-10-16 DIAGNOSIS — M2041 Other hammer toe(s) (acquired), right foot: Secondary | ICD-10-CM

## 2024-10-16 DIAGNOSIS — B351 Tinea unguium: Secondary | ICD-10-CM

## 2024-10-16 DIAGNOSIS — M79675 Pain in left toe(s): Secondary | ICD-10-CM

## 2024-10-16 DIAGNOSIS — L84 Corns and callosities: Secondary | ICD-10-CM | POA: Diagnosis not present

## 2024-10-20 ENCOUNTER — Other Ambulatory Visit: Payer: Self-pay

## 2024-10-20 ENCOUNTER — Ambulatory Visit (HOSPITAL_COMMUNITY)

## 2024-10-20 ENCOUNTER — Encounter (HOSPITAL_COMMUNITY): Payer: Self-pay | Admitting: Urology

## 2024-10-20 ENCOUNTER — Encounter (HOSPITAL_COMMUNITY): Admission: RE | Disposition: A | Payer: Self-pay | Source: Home / Self Care | Attending: Urology

## 2024-10-20 ENCOUNTER — Ambulatory Visit (HOSPITAL_COMMUNITY)
Admission: RE | Admit: 2024-10-20 | Discharge: 2024-10-20 | Disposition: A | Discharge status: Home / Self Care | Attending: Urology | Admitting: Urology

## 2024-10-20 DIAGNOSIS — N201 Calculus of ureter: Secondary | ICD-10-CM | POA: Diagnosis not present

## 2024-10-20 DIAGNOSIS — N202 Calculus of kidney with calculus of ureter: Secondary | ICD-10-CM | POA: Diagnosis not present

## 2024-10-20 DIAGNOSIS — N200 Calculus of kidney: Secondary | ICD-10-CM | POA: Diagnosis not present

## 2024-10-20 HISTORY — PX: EXTRACORPOREAL SHOCK WAVE LITHOTRIPSY: SHX1557

## 2024-10-20 SURGERY — LITHOTRIPSY, ESWL
Anesthesia: General | Laterality: Right

## 2024-10-20 MED ORDER — TAMSULOSIN HCL 0.4 MG PO CAPS: 0.4000 mg | ORAL_CAPSULE | Freq: Every day | ORAL | 0 refills | Status: AC

## 2024-10-20 MED ORDER — DIPHENHYDRAMINE HCL 25 MG PO CAPS
25.0000 mg | ORAL_CAPSULE | ORAL | Status: AC
  Administered 2024-10-20: 25 mg via ORAL
  Filled 2024-10-20: qty 1

## 2024-10-20 MED ORDER — CIPROFLOXACIN HCL 500 MG PO TABS
500.0000 mg | ORAL_TABLET | ORAL | Status: AC
  Administered 2024-10-20: 500 mg via ORAL
  Filled 2024-10-20: qty 1

## 2024-10-20 MED ORDER — HYDROCODONE-ACETAMINOPHEN 5-325 MG PO TABS: 1.0000 | ORAL_TABLET | Freq: Four times a day (QID) | ORAL | 0 refills | Status: AC | PRN

## 2024-10-20 MED ORDER — DIAZEPAM 5 MG PO TABS
5.0000 mg | ORAL_TABLET | ORAL | Status: AC
  Administered 2024-10-20: 5 mg via ORAL
  Filled 2024-10-20: qty 1

## 2024-10-20 MED ORDER — DIAZEPAM 5 MG PO TABS: 10.0000 mg | ORAL_TABLET | ORAL | Status: DC

## 2024-10-20 MED ORDER — SODIUM CHLORIDE 0.9 % IV SOLN: INTRAVENOUS | Status: DC

## 2024-10-20 NOTE — H&P (Signed)
 See scanned [facility] documents for H&P.  Right sided renal pelvis stone w/ hydronephrosis. R ESWL today.

## 2024-10-20 NOTE — Op Note (Signed)
 ESWL Operative Note  Treating Physician: Steffan Pea, MD  Pre-op diagnosis: R renal pelvis stone   Post-op diagnosis: Same   Procedure: Right  ESWL  See Norita Dustman OP note scanned into chart. Also because of the size, density, location and other factors that cannot be anticipated I feel this will likely be a staged procedure. This fact supersedes any indication in the scanned [place] stone operative note to the contrary.  Shocks: 3500 Avg power 2.2  Steffan Pea, MD [facility]

## 2024-10-20 NOTE — Discharge Instructions (Signed)
 DISCHARGE INSTRUCTIONS FOR KIDNEY STONE/ESWL MEDICATIONS:  1.  Hydrocodone  2. Tamsulosin    ACTIVITY:  1. No strenuous activity x 1week  2. No driving while on narcotic pain medications  3. Drink plenty of water   4. Continue to walk at home - it is normal to see blood in the urine while the stent is in place, so keep active, but don't over do it.  5. May return to work/school tomorrow or when you feel ready  6. You may experience some pain when urinating in the kidney on the side that was operated on while the stent is in place this is normal  BATHING:  1. You can shower and we recommend daily showers  2. You have a string coming from your urethra: The stent string is attached to your ureteral stent. Do not pull on this.   SIGNS/SYMPTOMS TO CALL:  Please call us  if you have a fever greater than 101.5, uncontrolled nausea/vomiting, uncontrolled pain, dizziness, unable to urinate, bloody urine with clots greater than the size of a quarter, chest pain, shortness of breath, leg swelling, leg pain, redness around wound, drainage from wound, or any other concerns or questions.   You can reach us  at [phone].   FOLLOW-UP:  1. Your follow up appointment has been attached to the discharge paper work

## 2024-10-22 ENCOUNTER — Encounter: Payer: Self-pay | Admitting: Podiatry

## 2024-10-22 NOTE — Progress Notes (Signed)
 Subjective: Chief Complaint  Patient presents with   Toe Pain    NP-RFC . Dr. Glenard is her PCP. She states she is boarder line diabetic. A1C is 5.8  Christina Salas presents today referred by Sowles, Krichna, MD for complaint of with chief concern of elongated, thickened, painful, discolored toenails  and calluses for several months. Patient has tried previous professional periodic management by community podiatrist and seeks to establish care with our clinic.  Past Medical History:  Diagnosis Date   Abnormal glucose    Anxiety    Arthritis    Breast cancer (HCC) 1996   Left- mastectomy   Cancer (HCC)    melanoma   Chronic headaches    Depression    Dysplastic nevus 05/11/2007   right upper gastric area. Slight to moderate atypia.   Dysplastic nevus 06/24/2020   R lateral deltoid, moderate to severe atypia. Excised 08/13/2020, margins free.   GERD (gastroesophageal reflux disease)    H/O melanoma excision    History of diverticulitis    History of recurrent UTIs    Hypertension    Melanoma (HCC)    scalp   Osteoporosis    pre-osetoporosis per patient history form 03/18/12   Pre-diabetes    Vitamin D  deficiency      Patient Active Problem List   Diagnosis Date Noted   Cervical myelopathy (HCC) 08/26/2024   Recurrent falls 08/26/2024   Brain atrophy 08/26/2024   History of stroke in adulthood 08/26/2024   Hypertension    Lymphedema 07/13/2023   Major depressive disorder, single episode, mild 04/20/2023   Menopausal osteoporosis 10/16/2022   History of iron  deficiency anemia 10/16/2022   AVM (arteriovenous malformation) of small bowel, acquired 10/16/2022   Neuropathy 10/16/2022   Stricture and stenosis of esophagus    Large hiatal hernia 01/24/2019   Internal hemorrhoids 01/24/2019   Iron  deficiency anemia due to chronic blood loss    Venous reflux 04/19/2018   Prediabetes 06/08/2016   Obesity (BMI 30.0-34.9) 06/08/2016   Vitamin D  deficiency 06/08/2016    Post-menopausal 12/11/2015   DD (diverticular disease) 06/19/2015   Esophageal reflux 06/19/2015   HLD (hyperlipidemia) 06/19/2015   Varicose veins of leg with swelling, bilateral 06/19/2015   Recurrent UTI 02/08/2013   Mixed incontinence 02/08/2013   History of breast cancer, left, T1, N0, Left MRM - Dec 1996 03/18/2012   Tension headache 05/24/2007   History of melanoma 08/28/1994     Past Surgical History:  Procedure Laterality Date   ABDOMINAL HYSTERECTOMY  2001   BREAST BIOPSY  2010   BREAST EXCISIONAL BIOPSY Right 1997   Benign excisional biopsy    CATARACT EXTRACTION Left 01/30/2021   Dr. Wanda Mae   COLONOSCOPY  04/02/2014   COLONOSCOPY WITH PROPOFOL  N/A 12/27/2018   Procedure: COLONOSCOPY WITH PROPOFOL ;  Surgeon: Jinny Carmine, MD;  Location: [facility] ENDOSCOPY;  Service: Endoscopy;  Laterality: N/A;   CYSTOSCOPY  11/2014   ESOPHAGOGASTRODUODENOSCOPY (EGD) WITH PROPOFOL  N/A 12/27/2018   Procedure: ESOPHAGOGASTRODUODENOSCOPY (EGD) WITH PROPOFOL ;  Surgeon: Jinny Carmine, MD;  Location: [facility] ENDOSCOPY;  Service: Endoscopy;  Laterality: N/A;   ESOPHAGOGASTRODUODENOSCOPY (EGD) WITH PROPOFOL  N/A 01/07/2021   Procedure: ESOPHAGOGASTRODUODENOSCOPY (EGD) WITH PROPOFOL ;  Surgeon: Jinny Carmine, MD;  Location: [facility] ENDOSCOPY;  Service: Endoscopy;  Laterality: N/A;   GIVENS CAPSULE STUDY N/A 01/07/2021   Procedure: GIVENS CAPSULE STUDY;  Surgeon: Jinny Carmine, MD;  [facility]: [facility] END[facility];  Service: Endoscopy;  Laterality: N/A;   MASTECTOMY  1996   Left  MELANOMA EXCISION  1995   Head     Current Outpatient Medications on File Prior to Visit  Medication Sig Dispense Refill   acetaminophen (TYLENOL) 500 MG tablet Take 500 mg by mouth every 6 (six) hours as needed for moderate pain (pain score 4-6).     butalbital -acetaminophen-caffeine  (FIORICET) 50-325-40 MG tablet Take 1 tablet by mouth every 6 (six) hours as needed. 50 tablet 0   Calcium-Magnesium  500-250 MG TABS Take 1 tablet  by mouth 2 (two) times daily.     CINNAMON PO Take 1 capsule by mouth daily. 350 mg     Cranberry 500 MG CAPS Take 1 tablet by mouth daily.     esomeprazole  (NEXIUM ) 20 MG capsule Take 1 capsule (20 mg total) by mouth daily before breakfast. 90 capsule 1   ferrous sulfate  (FEROSUL) 325 (65 FE) MG tablet Take 1 tablet (325 mg total) by mouth daily with breakfast. 90 tablet 1   HYDROcodone-acetaminophen (NORCO/VICODIN) 5-325 MG tablet Take 1 tablet by mouth every 6 (six) hours as needed for up to 6 doses for moderate pain (pain score 4-6). 6 tablet 0   Misc Natural Products (LEG VEIN & CIRCULATION) TABS Take 2 capsules by mouth daily.     Multiple Vitamins-Calcium (ESSENTIAL ONE DAILY MULTIVIT) TABS Take by mouth.     mupirocin  ointment (BACTROBAN ) 2 % Apply 1 Application topically daily. 22 g 0   Omega-3 Fatty Acids (FISH OIL) 1000 MG CAPS Take by mouth.     rosuvastatin (CRESTOR) 20 MG tablet Take 20 mg by mouth daily.     tamsulosin (FLOMAX) 0.4 MG CAPS capsule Take 1 capsule (0.4 mg total) by mouth daily after supper. 30 capsule 0   triamcinolone  cream (KENALOG ) 0.1 % Apply to lower legs QD-BID up to 5d/wk. Avoid applying to face, groin, and axilla. Use as directed. Long-term use can cause thinning of the skin. 80 g 11   No current facility-administered medications on file prior to visit.     No Known Allergies   Social History   Occupational History   Occupation: Retired  Tobacco Use   Smoking status: Never   Smokeless tobacco: Never  Vaping Use   Vaping status: Never Used  Substance and Sexual Activity   Alcohol use: No    Alcohol/week: 0.0 standard drinks of alcohol   Drug use: Never   Sexual activity: Not Currently     Family History  Problem Relation Age of Onset   Breast cancer Mother    Migraines Mother    Diabetes Father    CAD Father    Thrombosis Father    Aortic aneurysm Sister    Biliary Cirrhosis Brother    Breast cancer Other        Niece      Immunization History  Administered Date(s) Administered   Fluad Quad(high Dose 65+) 08/18/2019, 10/15/2020, 10/15/2021, 10/16/2022   Fluad Trivalent(High Dose 65+) 10/22/2023   INFLUENZA, HIGH DOSE SEASONAL PF 10/21/2015, 09/28/2016, 10/25/2017, 10/19/2018, 10/06/2024   Influenza Split 09/10/2007, 11/13/2008, 09/27/2009, 10/17/2010   Influenza, Seasonal, Injecte, Preservative Fre 10/30/2011, 10/21/2012   Influenza,inj,Quad PF,6+ Mos 09/06/2013, 10/19/2014   Moderna Covid-19 Fall Seasonal Vaccine 46yrs & older 11/11/2022, 09/25/2023   PFIZER(Purple Top)SARS-COV-2 Vaccination 01/23/2020, 02/13/2020, 10/28/2020   PNEUMOCOCCAL CONJUGATE-20 08/25/2024   Pfizer Covid-19 Vaccine Bivalent Booster 47yrs & up 06/10/2021   Pneumococcal Conjugate-13 12/03/2014   Pneumococcal Polysaccharide-23 06/15/2012   Tdap 12/03/2010   Unspecified SARS-COV-2 Vaccination 09/25/2023     Objective: There  were no vitals filed for this visit.  Christina Salas is a pleasant 89 y.o. female WD, WN in NAD. AAO x 3.  Vascular Examination: Vascular status intact b/l with palpable pedal pulses. Pedal hair present b/l. CFT immediate b/l. No pain with calf compression b/l. Skin temperature gradient WNL b/l. Trace edema noted BLE. Varicosities present b/l.  Neurological Examination: Sensation grossly intact b/l with 10 gram monofilament. Vibratory sensation intact b/l.   Dermatological Examination: Pedal skin with normal turgor, texture and tone b/l. Toenails 1-5 b/l thick, discolored, elongated with subungual debris and pain on dorsal palpation. Hyperkeratotic lesion(s) plantar IPJ of left great toe and submet head 5 b/l.  No erythema, no edema, no drainage, no fluctuance.  Musculoskeletal Examination: Muscle strength 5/5 to b/l LE. Muscle strength 5/5 to all lower extremity muscle groups bilaterally. Hammertoe(s) 2-5 b/l.  Radiographs: None  Assessment: 1. Pain due to onychomycosis of toenails of both feet    2. Callus   3. Acquired hammertoes of both feet   4. Prediabetes     Plan: Patient was evaluated and treated. All patient's and/or POA's questions/concerns addressed on today's visit. Toenails 1-5 b/l debrided in length and girth without incident. Continue soft, supportive shoe gear daily. Report any pedal injuries to medical professional. Call office if there are any questions/concerns. -Will submit preauthorization to insurance company for paring of calluses of both feet. -Discussed treatment options for onychomycosis. Patient opted for topical OTC therapy. Patient is to apply Vick's Vapor rub with cotton tipped applicator to affected toenail(s) once daily. -As a courtesy, callus(es) plantar IPJ of left great toe and submet head 5 b/l pared utilizing sterile scalpel blade without complication or incident. Total number pared=3. -Patient/POA to call should there be question/concern in the interim.  Return in about 3 months (around 01/16/2025).  Delon LITTIE Merlin, DPM      [place] LOCATION: [address], [place] [address]                   Office [phone]   [facility] LOCATION: [address] [place], [place] [address] Office [phone]

## 2024-10-23 ENCOUNTER — Encounter (HOSPITAL_COMMUNITY): Payer: Self-pay | Admitting: Urology

## 2024-11-02 DIAGNOSIS — R399 Unspecified symptoms and signs involving the genitourinary system: Secondary | ICD-10-CM | POA: Diagnosis not present

## 2024-11-02 DIAGNOSIS — N201 Calculus of ureter: Secondary | ICD-10-CM | POA: Diagnosis not present

## 2024-11-27 DIAGNOSIS — R310 Gross hematuria: Secondary | ICD-10-CM | POA: Diagnosis not present

## 2024-11-27 DIAGNOSIS — K5730 Diverticulosis of large intestine without perforation or abscess without bleeding: Secondary | ICD-10-CM | POA: Diagnosis not present

## 2024-11-30 DIAGNOSIS — K8020 Calculus of gallbladder without cholecystitis without obstruction: Secondary | ICD-10-CM | POA: Diagnosis not present

## 2024-11-30 DIAGNOSIS — N3020 Other chronic cystitis without hematuria: Secondary | ICD-10-CM | POA: Diagnosis not present

## 2024-11-30 DIAGNOSIS — N281 Cyst of kidney, acquired: Secondary | ICD-10-CM | POA: Diagnosis not present

## 2024-11-30 DIAGNOSIS — N200 Calculus of kidney: Secondary | ICD-10-CM | POA: Diagnosis not present

## 2024-11-30 DIAGNOSIS — K449 Diaphragmatic hernia without obstruction or gangrene: Secondary | ICD-10-CM | POA: Diagnosis not present

## 2024-12-07 NOTE — Progress Notes (Signed)
 Pharmacy Quality Measure Review  This patient is appearing on a report for being at risk of failing the adherence measure for cholesterol (statin) medications this calendar year.   Medication: rosuvastatin Last fill date: 11/17/24 for 9 day supply  Insurance report was not up to date. No action needed at this time.   Esaw Knippel E. Marsh, PharmD, CPP Clinical Pharmacist [facility] [phone]

## 2025-02-01 ENCOUNTER — Ambulatory Visit: Admitting: Podiatry

## 2025-03-22 ENCOUNTER — Ambulatory Visit: Admitting: Podiatry

## 2025-04-05 ENCOUNTER — Ambulatory Visit

## 2025-04-06 ENCOUNTER — Ambulatory Visit: Admitting: Family Medicine

## 2025-07-18 ENCOUNTER — Ambulatory Visit: Admitting: Dermatology
